# Patient Record
Sex: Female | Born: 1955
Health system: Southern US, Community
[De-identification: ages and names within clinical notes are randomized; demographics above are authoritative.]

## PROBLEM LIST (undated history)

## (undated) DIAGNOSIS — M549 Dorsalgia, unspecified: Secondary | ICD-10-CM

## (undated) DIAGNOSIS — D649 Anemia, unspecified: Secondary | ICD-10-CM

## (undated) DIAGNOSIS — K219 Gastro-esophageal reflux disease without esophagitis: Secondary | ICD-10-CM

## (undated) DIAGNOSIS — M546 Pain in thoracic spine: Secondary | ICD-10-CM

## (undated) DIAGNOSIS — I7300 Raynaud's syndrome without gangrene: Secondary | ICD-10-CM

## (undated) DIAGNOSIS — Z720 Tobacco use: Secondary | ICD-10-CM

## (undated) DIAGNOSIS — G8929 Other chronic pain: Secondary | ICD-10-CM

## (undated) DIAGNOSIS — I2510 Atherosclerotic heart disease of native coronary artery without angina pectoris: Secondary | ICD-10-CM

## (undated) DIAGNOSIS — G35 Multiple sclerosis: Secondary | ICD-10-CM

## (undated) DIAGNOSIS — E785 Hyperlipidemia, unspecified: Secondary | ICD-10-CM

## (undated) DIAGNOSIS — Z9289 Personal history of other medical treatment: Secondary | ICD-10-CM

## (undated) HISTORY — PX: KNEE ARTHROSCOPY: SHX127

---
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
## 1998-06-27 ENCOUNTER — Ambulatory Visit (HOSPITAL_COMMUNITY): Admission: RE | Admit: 1998-06-27 | Discharge: 1998-06-27 | Payer: Self-pay | Admitting: Neurology

## 1999-10-18 ENCOUNTER — Emergency Department (HOSPITAL_COMMUNITY): Admission: EM | Admit: 1999-10-18 | Discharge: 1999-10-18 | Payer: Self-pay | Admitting: Emergency Medicine

## 1999-12-09 ENCOUNTER — Encounter: Payer: Self-pay | Admitting: Internal Medicine

## 1999-12-09 ENCOUNTER — Ambulatory Visit (HOSPITAL_COMMUNITY): Admission: RE | Admit: 1999-12-09 | Discharge: 1999-12-09 | Payer: Self-pay | Admitting: Internal Medicine

## 2000-02-29 ENCOUNTER — Ambulatory Visit (HOSPITAL_COMMUNITY): Admission: RE | Admit: 2000-02-29 | Discharge: 2000-02-29 | Payer: Self-pay | Admitting: *Deleted

## 2000-02-29 ENCOUNTER — Encounter: Payer: Self-pay | Admitting: *Deleted

## 2005-03-14 HISTORY — PX: CORONARY ANGIOPLASTY WITH STENT PLACEMENT: SHX49

## 2008-02-28 ENCOUNTER — Ambulatory Visit: Payer: Self-pay | Admitting: Gastroenterology

## 2008-03-06 ENCOUNTER — Encounter: Payer: Self-pay | Admitting: Gastroenterology

## 2008-03-06 ENCOUNTER — Ambulatory Visit: Payer: Self-pay | Admitting: Gastroenterology

## 2008-10-03 ENCOUNTER — Encounter: Admission: RE | Admit: 2008-10-03 | Discharge: 2008-10-03 | Payer: Self-pay | Admitting: Neurosurgery

## 2008-10-08 ENCOUNTER — Ambulatory Visit: Payer: Self-pay | Admitting: Internal Medicine

## 2009-09-12 ENCOUNTER — Telehealth: Payer: Self-pay | Admitting: Internal Medicine

## 2009-09-13 ENCOUNTER — Ambulatory Visit: Payer: Self-pay | Admitting: Internal Medicine

## 2010-05-25 ENCOUNTER — Encounter: Admission: RE | Admit: 2010-05-25 | Discharge: 2010-05-25 | Payer: Self-pay | Admitting: Psychiatry

## 2011-01-15 NOTE — Assessment & Plan Note (Signed)
Summary: flu shot/cb   Flu Vaccine Consent Questions     Do you have a history of severe allergic reactions to this vaccine? no    Any prior history of allergic reactions to egg and/or gelatin? no    Do you have a sensitivity to the preservative Thimersol? no    Do you have a past history of Guillan-Barre Syndrome? no    Do you currently have an acute febrile illness? no    Have you ever had a severe reaction to latex? no    Vaccine information given and explained to patient? yes    Are you currently pregnant? no    Lot Number:[id number]   Exp Date:06/12/2009   Site Given  Right Deltoid IM   Given by Dimas Millin in allergy lab. Reynaldo Minium CMA  October 29, 2008 1:38 PM                                     ]

## 2011-01-15 NOTE — Progress Notes (Signed)
Summary: For flu vaccine  Phone Note Other Incoming   Summary of Call: I have asked her and her daughter to come by allergy lab for flu vaccine Initial call taken by: Waymon Budge MD,  September 12, 2009 7:39 PM  Follow-up for Phone Call        They both got their flu shot 09-13-09.  Drucie Opitz CMA

## 2011-01-15 NOTE — Assessment & Plan Note (Signed)
Summary: FLU SHOT/[facility]    Other Orders: Future Orders: Admin 1st Vaccine (16109) ... 09/16/2009 Flu Vaccine 36yrs + (60454) ... 09/16/2009    Flu Vaccine Consent Questions     Do you have a history of severe allergic reactions to this vaccine? no    Any prior history of allergic reactions to egg and/or gelatin? no    Do you have a sensitivity to the preservative Thimersol? no    Do you have a past history of Guillan-Barre Syndrome? no    Do you currently have an acute febrile illness? no    Have you ever had a severe reaction to latex? no    Vaccine information given and explained to patient? yes    Are you currently pregnant? no    Lot Number:[id number]   Exp Date:06/12/2010   Site Given Right Deltoid IMu Clarise Cruz (AAMA)  September 16, 2009 10:32 AM

## 2011-04-28 NOTE — Assessment & Plan Note (Signed)
[facility]                         GASTROENTEROLOGY OFFICE NOTE   NAME:Crystal Clark, Crystal Clark                     MRN:          [id number]  DATE:02/28/2008                            DOB:          December 17, 1955    REFERRING PHYSICIAN:  Coletta Clark, M.D.   MULTIPLE SCLEROSIS PHYSICIAN:  Crystal Clark, M.D. at [facility]  [facility].   CHIEF COMPLAINT:  Fecal incontinence.   HISTORY OF PRESENT ILLNESS:  Ms. Crystal Clark is a very pleasant 55 year old  woman who has had at least 2 years of intermittent fecal incontinence.  This has been definitely getting worse over the past 9 months to the  point where she will have daily staining of her underpants. She usually  moves her bowels twice a day, it is soft but generally formed.  She does  not have to strain to move her bowels, she has never seen blood in her  stool.  She is a long time MS patient and is on a variety of neurologic  medicines including Aricept, Oxybutynin, and Betaseron.  She was  recently seen by a spinal surgeon due to a severe T7-T8 herniated disk  that is causing her pretty severe pain.  Surgery for this would be quite  a major operation due to its location and she has elected against that  and instead is managing her pain with Tylenol, Tramadol, Lidoderm patch,  and a TENS unit intermittently.   REVIEW OF SYSTEMS:  Noted for a probable 10 pound weight loss in the  past 6 to 8 months, otherwise essentially normal and it is available on  the nursing intake sheet.   PAST MEDICAL HISTORY:  1. Coronary artery disease with stent in her LAD placed in 2006.  She      has been on Plavix since then but has had held her Plavix for      dental procedures without troubles.  2. Multiple sclerosis since 1997.  3. T7-T8 herniated disk.  4. Angina 2006.  5. Hypertension.  6. Elevated cholesterol.   CURRENT MEDICINES:  Lipitor, Zetia, Norvasc, Plavix, Betaseron, Ambien,  Aricept nightly, aspirin.   Oxybutynin on an as needed basis.  Tramadol,  Tylenol several a day.   ALLERGIES:  MYCINS cause a GI upset.   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  Nonsmoker, nondrinker.   FAMILY HISTORY:  Mother with colon polyps.   PHYSICAL EXAM:  5'2, 108 pounds.  Blood pressure 102/60, pulse 80.  CONSTITUTIONAL:  General well appearing.  NEUROLOGIC:  Alert and oriented x3.  EYES:  Extraocular movements intact.  MOUTH:  Oropharynx moist, no lesions.  NECK:  Supple, no lymphadenopathy.  CARDIOVASCULAR:  Heart regular rate and rhythm.  LUNGS:  Clear to auscultation bilaterally.  ABDOMEN:  Soft, nontender, nondistended.  Normal bowel sounds.  EXTREMITIES:  No lower extremity edema.  SKIN:  No rashes or lesions on visible extremities.  ANORECTAL EXAMINATION:  Deferred for upcoming colonoscopy.   ASSESSMENT/PLAN:  A 55 year old woman with family history of colon  polyps, long term history of  multiple sclerosis on several neurologic  agents with nearly daily fecal incontinence.   Neurologic agents are well known to affect bowel function and are  probably at least contributing to her gastrointestinal issues; that  being said, she needs them for her underlying disease and I hope to be  able to work around these.  I will arrange for her to have a full  colonoscopy both for family history of colon polyps and for her current  fecal incontinence.  Anorectal mechanical issues such as polyps and  tumors can cause incontinence although I doubt that to be the case with  her.  We will arrange for her to have a full colonoscopy at her soonest  convenience.  Following that, I would like her to try a solid 1 month of  fiber supplements to try to bulk her stools to see if we can prevent  some of her incontinence just with a bulking agent. If that does not  work, I may try very low dose Imodium.     Crystal Fee, MD  Electronically  Signed    DPJ/MedQ  DD: 02/28/2008  DT: 02/28/2008  Job #: [id number]   cc:   Crystal Clark, M.D.  Crystal Clark

## 2011-10-14 ENCOUNTER — Other Ambulatory Visit: Payer: Self-pay | Admitting: Psychiatry

## 2011-10-14 DIAGNOSIS — G35 Multiple sclerosis: Secondary | ICD-10-CM

## 2011-10-22 ENCOUNTER — Telehealth: Payer: Self-pay | Admitting: *Deleted

## 2011-10-22 MED ORDER — AZITHROMYCIN 250 MG PO TABS: ORAL_TABLET | ORAL | Status: AC

## 2011-10-22 NOTE — Telephone Encounter (Signed)
Per CY-okay to give her Zpak #1 take as directed no refills.

## 2011-11-07 ENCOUNTER — Ambulatory Visit
Admission: RE | Admit: 2011-11-07 | Discharge: 2011-11-07 | Disposition: A | Discharge status: Home / Self Care | Payer: Medicare Other | Source: Ambulatory Visit | Attending: Psychiatry | Admitting: Psychiatry

## 2011-11-07 DIAGNOSIS — G35 Multiple sclerosis: Secondary | ICD-10-CM

## 2011-11-07 MED ORDER — GADOBENATE DIMEGLUMINE 529 MG/ML IV SOLN
10.0000 mL | Freq: Once | INTRAVENOUS | Status: AC | PRN
  Administered 2011-11-07: 10 mL via INTRAVENOUS

## 2011-12-15 HISTORY — PX: CARDIAC CATHETERIZATION: SHX172

## 2011-12-22 ENCOUNTER — Encounter: Payer: Self-pay | Admitting: Emergency Medicine

## 2011-12-22 ENCOUNTER — Inpatient Hospital Stay (HOSPITAL_COMMUNITY)
Admission: EM | Admit: 2011-12-22 | Discharge: 2011-12-24 | DRG: 287 | Disposition: A | Discharge status: Home / Self Care | Payer: Managed Care, Other (non HMO) | Attending: Cardiology | Admitting: Cardiology

## 2011-12-22 ENCOUNTER — Emergency Department (HOSPITAL_COMMUNITY): Payer: Managed Care, Other (non HMO)

## 2011-12-22 DIAGNOSIS — Z9861 Coronary angioplasty status: Secondary | ICD-10-CM

## 2011-12-22 DIAGNOSIS — Z888 Allergy status to other drugs, medicaments and biological substances status: Secondary | ICD-10-CM

## 2011-12-22 DIAGNOSIS — I200 Unstable angina: Secondary | ICD-10-CM

## 2011-12-22 DIAGNOSIS — G43909 Migraine, unspecified, not intractable, without status migrainosus: Secondary | ICD-10-CM | POA: Diagnosis present

## 2011-12-22 DIAGNOSIS — I7300 Raynaud's syndrome without gangrene: Secondary | ICD-10-CM | POA: Diagnosis present

## 2011-12-22 DIAGNOSIS — I2510 Atherosclerotic heart disease of native coronary artery without angina pectoris: Principal | ICD-10-CM | POA: Diagnosis present

## 2011-12-22 DIAGNOSIS — G35 Multiple sclerosis: Secondary | ICD-10-CM | POA: Diagnosis present

## 2011-12-22 DIAGNOSIS — F17200 Nicotine dependence, unspecified, uncomplicated: Secondary | ICD-10-CM | POA: Diagnosis present

## 2011-12-22 DIAGNOSIS — R079 Chest pain, unspecified: Secondary | ICD-10-CM | POA: Diagnosis not present

## 2011-12-22 DIAGNOSIS — E785 Hyperlipidemia, unspecified: Secondary | ICD-10-CM | POA: Diagnosis present

## 2011-12-22 DIAGNOSIS — I10 Essential (primary) hypertension: Secondary | ICD-10-CM | POA: Diagnosis present

## 2011-12-22 DIAGNOSIS — I208 Other forms of angina pectoris: Secondary | ICD-10-CM | POA: Insufficient documentation

## 2011-12-22 DIAGNOSIS — I2089 Other forms of angina pectoris: Secondary | ICD-10-CM | POA: Insufficient documentation

## 2011-12-22 HISTORY — DX: Multiple sclerosis: G35

## 2011-12-22 HISTORY — DX: Atherosclerotic heart disease of native coronary artery without angina pectoris: I25.10

## 2011-12-22 HISTORY — DX: Hyperlipidemia, unspecified: E78.5

## 2011-12-22 LAB — BASIC METABOLIC PANEL
BUN: 10 mg/dL (ref 6–23)
CO2: 24 mEq/L (ref 19–32)
Calcium: 9.8 mg/dL (ref 8.4–10.5)
Chloride: 97 mEq/L (ref 96–112)
Creatinine, Ser: 0.65 mg/dL (ref 0.50–1.10)
GFR calc Af Amer: >90 mL/min (ref >90)
GFR calc non Af Amer: >90 mL/min (ref >90)
Glucose, Bld: 104 mg/dL — ABNORMAL HIGH (ref 70–99)
Potassium: 4.1 mEq/L (ref 3.5–5.1)
Sodium: 135 mEq/L (ref 135–145)

## 2011-12-22 LAB — CBC
HCT: 42.4 % (ref 36.0–46.0)
Hemoglobin: 14.3 g/dL (ref 12.0–15.0)
MCH: 31.6 pg (ref 26.0–34.0)
MCHC: 33.7 g/dL (ref 30.0–36.0)
MCV: 93.8 fL (ref 78.0–100.0)
Platelets: 328 10*3/uL (ref 150–400)
RBC: 4.52 MIL/uL (ref 3.87–5.11)
RDW: 13.2 % (ref 11.5–15.5)
WBC: 6 10*3/uL (ref 4.0–10.5)

## 2011-12-22 LAB — CARDIAC PANEL(CRET KIN+CKTOT+MB+TROPI)
CK, MB: 1.8 ng/mL (ref 0.3–4.0)
Relative Index: INVALID (ref 0.0–2.5)
Total CK: 62 U/L (ref 7–177)
Troponin I: <0.3 ng/mL (ref <0.30)

## 2011-12-22 LAB — PROTIME-INR
INR: 1.11 (ref 0.00–1.49)
Prothrombin Time: 14.5 seconds (ref 11.6–15.2)

## 2011-12-22 LAB — POCT I-STAT TROPONIN I: Troponin i, poc: 0 ng/mL (ref 0.00–0.08)

## 2011-12-22 LAB — APTT: aPTT: 183 seconds — ABNORMAL HIGH (ref 24–37)

## 2011-12-22 MED ORDER — BACLOFEN 10 MG PO TABS
10.0000 mg | ORAL_TABLET | Freq: Three times a day (TID) | ORAL | Status: DC
  Filled 2011-12-22 (×4): qty 1

## 2011-12-22 MED ORDER — DIAZEPAM 5 MG PO TABS
5.0000 mg | ORAL_TABLET | ORAL | Status: AC
  Administered 2011-12-23: 5 mg via ORAL
  Filled 2011-12-22: qty 1

## 2011-12-22 MED ORDER — AMLODIPINE BESYLATE 5 MG PO TABS
5.0000 mg | ORAL_TABLET | Freq: Every day | ORAL | Status: DC
  Administered 2011-12-23 – 2011-12-24 (×2): 5 mg via ORAL
  Filled 2011-12-22 (×3): qty 1

## 2011-12-22 MED ORDER — PANTOPRAZOLE SODIUM 40 MG PO TBEC
40.0000 mg | DELAYED_RELEASE_TABLET | Freq: Every day | ORAL | Status: DC
  Administered 2011-12-24: 40 mg via ORAL
  Filled 2011-12-22: qty 1

## 2011-12-22 MED ORDER — HEPARIN SOD (PORCINE) IN D5W 100 UNIT/ML IV SOLN
600.0000 [IU]/h | INTRAVENOUS | Status: AC
  Administered 2011-12-22: 600 [IU]/h via INTRAVENOUS
  Filled 2011-12-22: qty 250

## 2011-12-22 MED ORDER — RANOLAZINE ER 500 MG PO TB12
500.0000 mg | ORAL_TABLET | Freq: Two times a day (BID) | ORAL | Status: DC
  Administered 2011-12-22 – 2011-12-23 (×2): 500 mg via ORAL
  Filled 2011-12-22 (×3): qty 1

## 2011-12-22 MED ORDER — ISOSORBIDE MONONITRATE 15 MG HALF TABLET
15.0000 mg | ORAL_TABLET | Freq: Two times a day (BID) | ORAL | Status: DC
  Administered 2011-12-22 – 2011-12-24 (×4): 15 mg via ORAL
  Filled 2011-12-22 (×6): qty 1

## 2011-12-22 MED ORDER — CLOPIDOGREL BISULFATE 75 MG PO TABS
75.0000 mg | ORAL_TABLET | Freq: Every day | ORAL | Status: DC
  Administered 2011-12-23 – 2011-12-24 (×2): 75 mg via ORAL
  Filled 2011-12-22 (×2): qty 1

## 2011-12-22 MED ORDER — ASPIRIN 81 MG PO CHEW
324.0000 mg | CHEWABLE_TABLET | Freq: Once | ORAL | Status: AC
Start: 2011-12-22 — End: 2011-12-22
  Administered 2011-12-22: 324 mg via ORAL
  Filled 2011-12-22: qty 4

## 2011-12-22 MED ORDER — HEPARIN SOD (PORCINE) IN D5W 100 UNIT/ML IV SOLN
600.0000 [IU]/h | INTRAVENOUS | Status: DC
  Administered 2011-12-22: 600 [IU]/h via INTRAVENOUS
  Filled 2011-12-22: qty 250

## 2011-12-22 MED ORDER — SODIUM CHLORIDE 0.9 % IJ SOLN: 3.0000 mL | INTRAMUSCULAR | Status: DC | PRN

## 2011-12-22 MED ORDER — FINGOLIMOD HCL 0.5 MG PO CAPS
1.0000 | ORAL_CAPSULE | Freq: Every day | ORAL | Status: DC
  Administered 2011-12-24: 0.5 mg via ORAL
  Filled 2011-12-22 (×3): qty 1

## 2011-12-22 MED ORDER — ASPIRIN 81 MG PO CHEW
324.0000 mg | CHEWABLE_TABLET | ORAL | Status: AC
  Administered 2011-12-23: 324 mg via ORAL
  Filled 2011-12-22: qty 4

## 2011-12-22 MED ORDER — HEPARIN BOLUS VIA INFUSION
2900.0000 [IU] | Freq: Once | INTRAVENOUS | Status: AC
  Administered 2011-12-22: 2900 [IU] via INTRAVENOUS
  Filled 2011-12-22: qty 2900

## 2011-12-22 MED ORDER — SODIUM CHLORIDE 0.9 % IJ SOLN: 3.0000 mL | Freq: Two times a day (BID) | INTRAMUSCULAR | Status: DC

## 2011-12-22 MED ORDER — ZOLPIDEM TARTRATE 5 MG PO TABS
5.0000 mg | ORAL_TABLET | Freq: Every evening | ORAL | Status: DC | PRN
  Filled 2011-12-22: qty 1

## 2011-12-22 MED ORDER — ASPIRIN EC 81 MG PO TBEC
81.0000 mg | DELAYED_RELEASE_TABLET | Freq: Every day | ORAL | Status: DC
  Filled 2011-12-22 (×2): qty 1

## 2011-12-22 MED ORDER — NITROGLYCERIN IN D5W 200-5 MCG/ML-% IV SOLN
2.0000 ug/min | INTRAVENOUS | Status: DC
  Administered 2011-12-22: 5 ug/min via INTRAVENOUS
  Filled 2011-12-22: qty 250

## 2011-12-22 MED ORDER — SIMVASTATIN 20 MG PO TABS
20.0000 mg | ORAL_TABLET | Freq: Every day | ORAL | Status: DC
  Filled 2011-12-22 (×2): qty 1

## 2011-12-22 MED ORDER — TRAMADOL HCL 50 MG PO TABS
50.0000 mg | ORAL_TABLET | Freq: Four times a day (QID) | ORAL | Status: DC | PRN
  Filled 2011-12-22: qty 2

## 2011-12-22 MED ORDER — NITROGLYCERIN 0.4 MG SL SUBL: 0.4000 mg | SUBLINGUAL_TABLET | SUBLINGUAL | Status: DC | PRN

## 2011-12-22 MED ORDER — ALPRAZOLAM 0.25 MG PO TABS: 0.0625 mg | ORAL_TABLET | Freq: Every evening | ORAL | Status: DC | PRN

## 2011-12-22 MED ORDER — HEPARIN BOLUS VIA INFUSION: 5000.0000 [IU] | Freq: Once | INTRAVENOUS | Status: DC

## 2011-12-22 MED ORDER — SODIUM CHLORIDE 0.9 % IV SOLN
1.0000 mL/kg/h | INTRAVENOUS | Status: DC
  Administered 2011-12-23: 1 mL/kg/h via INTRAVENOUS

## 2011-12-22 MED ORDER — BUPROPION HCL ER (SR) 150 MG PO TB12
150.0000 mg | ORAL_TABLET | Freq: Every day | ORAL | Status: DC
  Administered 2011-12-23 – 2011-12-24 (×2): 150 mg via ORAL
  Filled 2011-12-22 (×3): qty 1

## 2011-12-22 MED ORDER — ASPIRIN 81 MG PO CHEW: 324.0000 mg | CHEWABLE_TABLET | ORAL | Status: DC

## 2011-12-22 MED ORDER — SODIUM CHLORIDE 0.9 % IV SOLN: 250.0000 mL | INTRAVENOUS | Status: DC | PRN

## 2011-12-22 MED ORDER — NITROGLYCERIN IN D5W 200-5 MCG/ML-% IV SOLN
3.0000 ug/min | INTRAVENOUS | Status: DC
  Administered 2011-12-22: 15 ug/min via INTRAVENOUS

## 2011-12-22 MED ORDER — ONDANSETRON HCL 4 MG/2ML IJ SOLN
4.0000 mg | Freq: Four times a day (QID) | INTRAMUSCULAR | Status: DC | PRN
  Administered 2011-12-23: 4 mg via INTRAVENOUS

## 2011-12-22 MED ORDER — LORATADINE 10 MG PO TABS
10.0000 mg | ORAL_TABLET | Freq: Every day | ORAL | Status: DC
  Administered 2011-12-23 – 2011-12-24 (×2): 10 mg via ORAL
  Filled 2011-12-22 (×3): qty 1

## 2011-12-22 MED ORDER — LIDOCAINE 5 % EX PTCH
2.0000 | MEDICATED_PATCH | Freq: Every day | CUTANEOUS | Status: DC | PRN
  Filled 2011-12-22: qty 2

## 2011-12-22 MED ORDER — ASPIRIN EC 81 MG PO TBEC
81.0000 mg | DELAYED_RELEASE_TABLET | Freq: Every day | ORAL | Status: DC
  Administered 2011-12-24: 81 mg via ORAL
  Filled 2011-12-22 (×2): qty 1

## 2011-12-22 MED ORDER — SODIUM CHLORIDE 0.9 % IJ SOLN
3.0000 mL | Freq: Two times a day (BID) | INTRAMUSCULAR | Status: DC
  Administered 2011-12-23: 10:00:00 via INTRAVENOUS

## 2011-12-22 MED ORDER — ASPIRIN 300 MG RE SUPP: 300.0000 mg | RECTAL | Status: DC

## 2011-12-22 MED ORDER — EZETIMIBE 10 MG PO TABS
10.0000 mg | ORAL_TABLET | Freq: Every day | ORAL | Status: DC
  Administered 2011-12-23 – 2011-12-24 (×2): 10 mg via ORAL
  Filled 2011-12-22 (×3): qty 1

## 2011-12-22 MED ORDER — ALBUTEROL SULFATE HFA 108 (90 BASE) MCG/ACT IN AERS: 2.0000 | INHALATION_SPRAY | RESPIRATORY_TRACT | Status: DC | PRN

## 2011-12-22 MED ORDER — ACETAMINOPHEN 325 MG PO TABS: 650.0000 mg | ORAL_TABLET | ORAL | Status: DC | PRN

## 2011-12-22 NOTE — ED Provider Notes (Signed)
History     CSN: [id number]  Arrival date & time 12/22/11  1607   First MD Initiated Contact with Patient 12/22/11 1704      Chief Complaint  Patient presents with  . Chest Pain    (Consider location/radiation/quality/duration/timing/severity/associated sxs/prior treatment) HPI History provided by pt.   Pt presents w/ what she believes to be unstable angina.   Has had intermittent, exertional, self-limited, squeezing sensation in center of chest w/ radiation to back and associated SOB, nausea and LUE paresthesias x 2-3 weeks.  2 nights ago, she woke in the middle of the night with the same pain.  Pain started again this am and has been constant and refractory to imdur.  Pt had similar pain but less severe back in 2006, just prior to stenting of 80% occluded proximal LAD.  Did not have EKG changes at that time.  Most recent cath in 2010 which showed a 40% occluded stent.  Pain non-pleuritic.  No recent cough, fever, abd pain, heavy lifting or trauma.  Smokes cigarettes occasionally and strong FH CAD.  Cardiologist located at [facility].    Past Medical History  Diagnosis Date  . Coronary artery disease   . Angina at rest     History reviewed. No pertinent past surgical history.  History reviewed. No pertinent family history.  History  Substance Use Topics  . Smoking status: Current Everyday Smoker  . Smokeless tobacco: Not on file  . Alcohol Use: No    OB History    Grav Para Term Preterm Abortions TAB SAB Ect Mult Living                  Review of Systems  All other systems reviewed and are negative.    Allergies  Review of patient's allergies indicates no known allergies.  Home Medications   Current Outpatient Rx  Name Route Sig Dispense Refill  . ALPRAZOLAM 0.25 MG PO TABS Oral Take 0.0625 mg by mouth at bedtime as needed. For sleep     . AMLODIPINE BESYLATE 5 MG PO TABS Oral Take 5 mg by mouth daily.      . ASPIRIN EC 81 MG PO TBEC Oral Take 81 mg by  mouth daily.      . ATORVASTATIN CALCIUM 20 MG PO TABS Oral Take 20 mg by mouth daily.      Marland Kitchen BACLOFEN 10 MG PO TABS Oral Take 10 mg by mouth 3 (three) times daily.      . BUPROPION HCL ER (SR) 150 MG PO TB12 Oral Take 150 mg by mouth daily.      Marland Kitchen CLOPIDOGREL BISULFATE 75 MG PO TABS Oral Take 75 mg by mouth daily.      Marland Kitchen ESZOPICLONE 2 MG PO TABS Oral Take 2 mg by mouth at bedtime. Take immediately before bedtime     . EZETIMIBE 10 MG PO TABS Oral Take 10 mg by mouth daily.      Marland Kitchen FINGOLIMOD HCL 0.5 MG PO CAPS Oral Take 1 capsule by mouth daily.      . ISOSORBIDE MONONITRATE ER 30 MG PO TB24 Oral Take 15 mg by mouth 2 (two) times daily.      Marland Kitchen LORATADINE 10 MG PO TABS Oral Take 10 mg by mouth daily.      Marland Kitchen OMEPRAZOLE 20 MG PO CPDR Oral Take 20 mg by mouth daily.      Marland Kitchen RANOLAZINE ER 500 MG PO TB12 Oral Take 500 mg by mouth 2 (  two) times daily.      . TRAMADOL HCL 50 MG PO TABS Oral Take 50-100 mg by mouth every 6 (six) hours as needed. For pain     . ALBUTEROL SULFATE HFA 108 (90 BASE) MCG/ACT IN AERS Inhalation Inhale 2 puffs into the lungs every 4 (four) hours as needed. For shortness of breath     . LIDOCAINE 5 % EX PTCH Transdermal Place 2 patches onto the skin daily as needed. For pain Remove & Discard patch within 12 hours or as directed by MD       BP 137/81  Pulse 82  Temp(Src) 97.4 F (36.3 C) (Oral)  Resp 18  SpO2 100%  Physical Exam  Nursing note and vitals reviewed. Constitutional: She is oriented to person, place, and time. She appears well-developed and well-nourished. No distress.  HENT:  Head: Normocephalic and atraumatic.  Eyes:       Normal appearance  Neck: Normal range of motion.  Cardiovascular: Normal rate and regular rhythm.   Pulmonary/Chest: Effort normal and breath sounds normal. No respiratory distress. She exhibits no tenderness.  Abdominal: Soft. Bowel sounds are normal. She exhibits no distension. There is no tenderness. There is no guarding.    Musculoskeletal: She exhibits no edema and no tenderness.       No calf ttp or peripheral edema  Neurological: She is alert and oriented to person, place, and time.  Skin: Skin is warm and dry. No rash noted. She is not diaphoretic.  Psychiatric: She has a normal mood and affect. Her behavior is normal.    ED Course  Procedures (including critical care time)  Labs Reviewed  BASIC METABOLIC PANEL - Abnormal; Notable for the following:    Glucose, Bld 104 (*)    All other components within normal limits  APTT - Abnormal; Notable for the following:    aPTT 183 (*)    All other components within normal limits  LIPID PANEL - Abnormal; Notable for the following:    Cholesterol 250 (*)    LDL Cholesterol 152 (*)    All other components within normal limits  CBC - Abnormal; Notable for the following:    RBC 3.80 (*)    Hemoglobin 11.8 (*) DELTA CHECK NOTED   HCT 35.5 (*)    All other components within normal limits  CBC  POCT I-STAT TROPONIN I  CARDIAC PANEL(CRET KIN+CKTOT+MB+TROPI)  CARDIAC PANEL(CRET KIN+CKTOT+MB+TROPI)  CARDIAC PANEL(CRET KIN+CKTOT+MB+TROPI)  PROTIME-INR  BASIC METABOLIC PANEL  MRSA PCR SCREENING  HEPARIN LEVEL (UNFRACTIONATED)  I-STAT TROPONIN I  HEMOGLOBIN A1C   Dg Chest 2 View  12/22/2011  *RADIOLOGY REPORT*  Clinical Data: Left chest pain history of coronary disease  CHEST - 2 VIEW  Comparison: None.  Findings: Normal heart size and vascularity.  Negative for CHF, pneumonia, collapse, consolidation, effusion or pneumothorax. Trachea midline.  Symmetric lower chest nodular densities compatible with nipple shadows, this could be confirmed with repeat exam including markers.  Trachea is midline.  Mild thoracic degenerative changes.  Coronary stents noted.  IMPRESSION: No acute chest finding.  Original Report Authenticated By: Judie Petit. Ruel Favors, M.D.     1. Unstable angina       MDM  Pt w/ h/o CAD, most recent cath in 2010 which showed 40% occluded proximal  LAD stent, presents w/ intermittent CP x 2+ wks.  Pain has progressed in severity and is now occuring at rest and refractory to imdur.  Pt w/ active CP in ED.  Aspirin  and nitro drip ordered.  No acute findings on exam.  EKG non-ischemic (no prior; cardiologist at [facility]).  Labs pending.  5:49 PM   Pt reports that pain has improved from 8 to 5/10 but she is also experiencing a headache from the nitroglycerin.  She is able to tolerate it for now and declines tylenol.  Troponin neg and labs otherwise unremarkable.    Pain has resolved at 36mcg/min nitro drip.  Consulted Dr. Myrtis Ser w/ [facility].  They request a repeat EKG and 5000 units heparin bolus followed by drip.            Otilio Miu, [place] 12/23/11 1443

## 2011-12-22 NOTE — H&P (Signed)
History and Physical  Patient ID: Crystal Clark MRN: [id number], SOB: [birth year] 56 y.o. Date of Encounter: 12/22/2011, 8:19 PM  Primary Physician: No primary provider on file. Primary Cardiologist: Dr. Claris Gower in [facility], transferring care to Dr. Antoine Poche  Chief Complaint: Chest pain  Reason for Admission: UA  HPI:   Ms. Crystal Clark is a 56 y.o. female with history of CAD s/p stent LAD in 2006 followed by cardiology at [facility].  She is currently trying to transfer care to Dr. Antoine Poche to have a cardiologist that is closer to her home.  She has had boughts of again since stent with last cath being in 2010, per patient her stent had 40% stenosis at that time.  She has been on medical therapy.  She also has history of HTN, HL and MS.    Her angina has progressively gotten worse over the last month with the last 3 days being the worse.  Her angina is a feeling of someone squeezing her heart.  The last 3 days she has had associated left arm numbness, N, and diaphoresis.  1 episode awoke her from her sleep approximately 3 days ago.  The pain has been relieved by nitro until today.  Yesterday she felt increased fatigue, worse than her typical MS fatigue.  Today she awoke and had the squeezing sensation all day.  She has not had relief with nitro.  When the pain did not resolve she came to the ER for further evaluation.    She has been placed on nitroglycerin drip.  This has helped ease the squeezing sensation but not there is still some pressure across the front of her chest.  Troponin is negative x1.  EKG SR without acute changes.  Vitals are stable.  Cardiology was asked to admit the patient for further evaluation.      Past Medical History  Diagnosis Date  . Coronary artery disease     s/p stent to LAD in 2006  . Angina at rest   . MS (multiple sclerosis)     since 1997  . Hypertension   . Dyslipidemia      Surgical History: History reviewed. No pertinent past surgical history.    Home Meds: Prior to Admission medications   Medication Sig Start Date End Date Taking? Authorizing Provider  ALPRAZolam (XANAX) 0.25 MG tablet Take 0.0625 mg by mouth at bedtime as needed. For sleep    Yes Historical Provider, MD  amLODipine (NORVASC) 5 MG tablet Take 5 mg by mouth daily.     Yes Historical Provider, MD  aspirin EC 81 MG tablet Take 81 mg by mouth daily.     Yes Historical Provider, MD  atorvastatin (LIPITOR) 20 MG tablet Take 20 mg by mouth daily.     Yes Historical Provider, MD  baclofen (LIORESAL) 10 MG tablet Take 10 mg by mouth 3 (three) times daily.     Yes Historical Provider, MD  buPROPion (WELLBUTRIN SR) 150 MG 12 hr tablet Take 150 mg by mouth daily.     Yes Historical Provider, MD  clopidogrel (PLAVIX) 75 MG tablet Take 75 mg by mouth daily.     Yes Historical Provider, MD  eszopiclone (LUNESTA) 2 MG TABS Take 2 mg by mouth at bedtime. Take immediately before bedtime    Yes Historical Provider, MD  ezetimibe (ZETIA) 10 MG tablet Take 10 mg by mouth daily.     Yes Historical Provider, MD  Fingolimod HCl (GILENYA) 0.5 MG CAPS Take 1  capsule by mouth daily.     Yes Historical Provider, MD  isosorbide mononitrate (IMDUR) 30 MG 24 hr tablet Take 15 mg by mouth 2 (two) times daily.     Yes Historical Provider, MD  loratadine (CLARITIN) 10 MG tablet Take 10 mg by mouth daily.     Yes Historical Provider, MD  omeprazole (PRILOSEC) 20 MG capsule Take 20 mg by mouth daily.     Yes Historical Provider, MD  ranolazine (RANEXA) 500 MG 12 hr tablet Take 500 mg by mouth 2 (two) times daily.     Yes Historical Provider, MD  traMADol (ULTRAM) 50 MG tablet Take 50-100 mg by mouth every 6 (six) hours as needed. For pain    Yes Historical Provider, MD  albuterol (PROVENTIL HFA;VENTOLIN HFA) 108 (90 BASE) MCG/ACT inhaler Inhale 2 puffs into the lungs every 4 (four) hours as needed. For shortness of breath     Historical Provider, MD  lidocaine (LIDODERM) 5 % Place 2 patches onto the  skin daily as needed. For pain Remove & Discard patch within 12 hours or as directed by MD     Historical Provider, MD    Allergies: No Known Allergies  History   Social History  . Marital Status: Married    Spouse Name: N/A    Number of Children: N/A  . Years of Education: N/A   Occupational History  . 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 Smoking status: Never Smoker   . Smokeless tobacco: Not on file  . Alcohol Use: No  . Drug Use: No  . Sexually Active: Not on file   Other Topics Concern  . Not on file   Social History Narrative  . No narrative on file     Family History  Problem Relation Age of Onset  . Colon polyps Mother   . Alzheimer's disease Mother   . Hypertension Mother   . Coronary artery disease Father     s/p CABG at 66    Review of Systems: General: negative for chills, fever, night sweats or weight changes, +fatigue  Cardiovascular: + chest pain, negative for edema, orthopnea, palpitations, paroxysmal nocturnal dyspnea, shortness of breath or dyspnea on exertion Dermatological: negative for rash Respiratory: negative for cough or wheezing Urologic: negative for hematuria Abdominal: negative for nausea, vomiting, diarrhea, bright red blood per rectum, melena, or hematemesis Neurologic: negative for visual changes, syncope, or dizziness All other systems reviewed and are otherwise negative except as noted above.  Labs:   Lab Results  Component Value Date   WBC 6.0 12/22/2011   HGB 14.3 12/22/2011   HCT 42.4 12/22/2011   MCV 93.8 12/22/2011   PLT 328 12/22/2011     Lab 12/22/11 1659  NA 135  K 4.1  CL 97  CO2 24  BUN 10  CREATININE 0.65  CALCIUM 9.8  PROT --  BILITOT --  ALKPHOS --  ALT --  AST --  GLUCOSE 104*   No results found for this basename: CKTOTAL:4,CKMB:4,TROPONINI:4 in the last 72 hours No results found for this basename: CHOL,  HDL,  LDLCALC,  TRIG   No results found for this basename:  DDIMER    Radiology/Studies:  Dg Chest 2 View  12/22/2011  *RADIOLOGY REPORT*  Clinical Data: Left chest pain history of coronary disease  CHEST - 2 VIEW  Comparison: None.  Findings: Normal heart size and vascularity.  Negative for CHF, pneumonia, collapse, consolidation,  effusion or pneumothorax. Trachea midline.  Symmetric lower chest nodular densities compatible with nipple shadows, this could be confirmed with repeat exam including markers.  Trachea is midline.  Mild thoracic degenerative changes.  Coronary stents noted.  IMPRESSION: No acute chest finding.  Original Report Authenticated By: Judie Petit. TREVOR Miles Costain, M.D.     EKG: SR 81 bpm, NSST wave changes   Physical Exam: Blood pressure 130/75, pulse 74, temperature 98.7 F (37.1 C), temperature source Oral, resp. rate 22, height 5\' 2"  (1.575 m), weight 106 lb (48.081 kg), SpO2 99.00%. General: Well developed, well nourished, in no acute distress. Head: Normocephalic, atraumatic, sclera non-icteric, no xanthomas, nares are without discharge.  Neck: Negative for carotid bruits. JVD not elevated. Lungs: Clear bilaterally to auscultation without wheezes, rales, or rhonchi. Breathing is unlabored. Heart: RRR with S1 S2. No murmurs, rubs, or gallops appreciated. Abdomen: Soft, non-tender, non-distended with normoactive bowel sounds. No hepatomegaly. No rebound/guarding. No obvious abdominal masses. Msk:  Strength and tone appear normal for age. Extremities: No clubbing or cyanosis. No edema.  Distal pedal pulses are 2+ and equal bilaterally. Neuro: Alert and oriented X 3. Moves all extremities spontaneously. Psych:  Responds to questions appropriately with a normal affect.     ASSESSMENT AND PLAN:  1. CAD s/p stent to the LAD in 2006 now presenting with unstable angina.  - admit to telemetry  - continue nitro gtt  - cycle cardiac markers   - plan cath in the morning  - heparin per pharmacy 2. HTN - stable continue current medications 3.  HL - check lipids 4. MS - pharmacy does not have gilenya in stock so the patient will bring this from home  Signed, Robbi Garter PA-C 12/22/2011, 8:19 PM  Patient seen and examined. I agree with the assessment and plan as detailed above. See also my additional thoughts below.   I saw patient in ER. She is establishing with Dr. Antoine Poche. She needs to be cathed this admission.  Willa Rough, MD, [facility] 12/23/2011 6:50 AM

## 2011-12-22 NOTE — ED Notes (Addendum)
Pt returned from x-ray. States pain decreased to 5/10. Increased nitro gtt to 10 mcg/min.

## 2011-12-22 NOTE — ED Notes (Signed)
Pt c/o intermittant CP since before christmas; pt sts has taken nitro at home with releif at times; pt sts took 5 SL nitro and meds today and did not help today; pt sts midsternal chest pressure with left arm numbness

## 2011-12-22 NOTE — ED Notes (Signed)
Report to [place], [place] on 2900. Informed her Heparin gtt had not arrived from pharmacy.

## 2011-12-22 NOTE — Progress Notes (Signed)
ANTICOAGULATION CONSULT NOTE - Initial Consult  Pharmacy Consult for heparin Indication: chest pain/ACS  No Known Allergies  Patient Measurements: Height: 5\' 2"  (157.5 cm) Weight: 106 lb (48.081 kg) IBW/kg (Calculated) : 50.1  Adjusted Body Weight: 48 kg  Vital Signs: Temp: 98.7 F (37.1 C) (01/08 1821) Temp src: Oral (01/08 1821) BP: 129/75 mmHg (01/08 1925) Pulse Rate: 74  (01/08 1821)  Labs:  Basename 12/22/11 1659  HGB 14.3  HCT 42.4  PLT 328  APTT --  LABPROT --  INR --  HEPARINUNFRC --  CREATININE 0.65  CKTOTAL --  CKMB --  TROPONINI --   Estimated Creatinine Clearance: 61.8 ml/min (by C-G formula based on Cr of 0.65).  Medical History: Past Medical History  Diagnosis Date  . Coronary artery disease   . Angina at rest     Medications:  Scheduled:    . aspirin  324 mg Oral Once  . heparin  5,000 Units Intravenous Once   Infusions:    . nitroGLYCERIN 15 mcg/min (12/22/11 1907)    Assessment: 56 yo female with ACS will be started on heparin.  Plt 328.  On aspirin and plavix at home. Goal of Therapy:  Heparin level 0.3-0.7 units/ml   Plan:  1) Start heparin 2900 units iv bolus x1, then heparin drip at 600 units/hr 2) Check a 6 hours heparin level after drip is started 3) Daily heparin level and CBC  Francy Mcilvaine, Tsz-Yin 12/22/2011,7:49 PM

## 2011-12-22 NOTE — ED Notes (Signed)
Pt denies chest and back pain. States her chest feels heavy. PA at bedside. PA states to continue NTG gtt at 21mcg/min.

## 2011-12-22 NOTE — ED Notes (Signed)
2922-01 Ready 

## 2011-12-22 NOTE — ED Notes (Signed)
Resting quietly in bed. No change from previous assessment. Denies pain. NTG infusing at 55mcg/hr. Awaiting heparin gtt from pharmacy. NAD noted.

## 2011-12-23 ENCOUNTER — Encounter (HOSPITAL_COMMUNITY)
Admission: EM | Disposition: A | Discharge status: Home / Self Care | Payer: Self-pay | Source: Home / Self Care | Attending: Cardiology

## 2011-12-23 DIAGNOSIS — I2510 Atherosclerotic heart disease of native coronary artery without angina pectoris: Secondary | ICD-10-CM

## 2011-12-23 DIAGNOSIS — R079 Chest pain, unspecified: Secondary | ICD-10-CM | POA: Diagnosis not present

## 2011-12-23 DIAGNOSIS — E785 Hyperlipidemia, unspecified: Secondary | ICD-10-CM | POA: Diagnosis present

## 2011-12-23 HISTORY — PX: LEFT HEART CATHETERIZATION WITH CORONARY ANGIOGRAM: SHX5451

## 2011-12-23 LAB — CARDIAC PANEL(CRET KIN+CKTOT+MB+TROPI)
CK, MB: 1.7 ng/mL (ref 0.3–4.0)
CK, MB: 1.8 ng/mL (ref 0.3–4.0)
Relative Index: INVALID (ref 0.0–2.5)
Relative Index: INVALID (ref 0.0–2.5)
Total CK: 53 U/L (ref 7–177)
Total CK: 58 U/L (ref 7–177)
Troponin I: <0.3 ng/mL (ref <0.30)
Troponin I: <0.3 ng/mL (ref <0.30)

## 2011-12-23 LAB — BASIC METABOLIC PANEL
BUN: 10 mg/dL (ref 6–23)
CO2: 26 mEq/L (ref 19–32)
Calcium: 9.3 mg/dL (ref 8.4–10.5)
Chloride: 104 mEq/L (ref 96–112)
Creatinine, Ser: 0.65 mg/dL (ref 0.50–1.10)
GFR calc Af Amer: >90 mL/min (ref >90)
GFR calc non Af Amer: >90 mL/min (ref >90)
Glucose, Bld: 81 mg/dL (ref 70–99)
Potassium: 3.7 mEq/L (ref 3.5–5.1)
Sodium: 140 mEq/L (ref 135–145)

## 2011-12-23 LAB — LIPID PANEL
Cholesterol: 250 mg/dL — ABNORMAL HIGH (ref 0–200)
HDL: 84 mg/dL (ref >39)
LDL Cholesterol: 152 mg/dL — ABNORMAL HIGH (ref 0–99)
Total CHOL/HDL Ratio: 3 RATIO
Triglycerides: 71 mg/dL (ref <150)
VLDL: 14 mg/dL (ref 0–40)

## 2011-12-23 LAB — CBC
HCT: 35.5 % — ABNORMAL LOW (ref 36.0–46.0)
Hemoglobin: 11.8 g/dL — ABNORMAL LOW (ref 12.0–15.0)
MCH: 31.1 pg (ref 26.0–34.0)
MCHC: 33.2 g/dL (ref 30.0–36.0)
MCV: 93.4 fL (ref 78.0–100.0)
Platelets: 292 10*3/uL (ref 150–400)
RBC: 3.8 MIL/uL — ABNORMAL LOW (ref 3.87–5.11)
RDW: 13.6 % (ref 11.5–15.5)
WBC: 4.1 10*3/uL (ref 4.0–10.5)

## 2011-12-23 LAB — HEMOGLOBIN A1C
Hgb A1c MFr Bld: 5.7 % — ABNORMAL HIGH (ref <5.7)
Mean Plasma Glucose: 117 mg/dL — ABNORMAL HIGH (ref <117)

## 2011-12-23 LAB — MRSA PCR SCREENING: MRSA by PCR: NEGATIVE

## 2011-12-23 LAB — HEPARIN LEVEL (UNFRACTIONATED): Heparin Unfractionated: 0.39 IU/mL (ref 0.30–0.70)

## 2011-12-23 LAB — POCT ACTIVATED CLOTTING TIME: Activated Clotting Time: 127 seconds

## 2011-12-23 SURGERY — LEFT HEART CATHETERIZATION WITH CORONARY ANGIOGRAM
Anesthesia: LOCAL

## 2011-12-23 MED ORDER — SODIUM CHLORIDE 0.9 % IV SOLN
INTRAVENOUS | Status: AC
  Administered 2011-12-23: 13:00:00 via INTRAVENOUS

## 2011-12-23 MED ORDER — HEPARIN (PORCINE) IN NACL 2-0.9 UNIT/ML-% IJ SOLN
INTRAMUSCULAR | Status: AC
  Filled 2011-12-23: qty 2000

## 2011-12-23 MED ORDER — NITROGLYCERIN 0.2 MG/ML ON CALL CATH LAB
INTRAVENOUS | Status: AC
  Filled 2011-12-23: qty 1

## 2011-12-23 MED ORDER — BACLOFEN 10 MG PO TABS
10.0000 mg | ORAL_TABLET | Freq: Three times a day (TID) | ORAL | Status: DC | PRN
  Filled 2011-12-23: qty 1

## 2011-12-23 MED ORDER — RANOLAZINE ER 500 MG PO TB12
1000.0000 mg | ORAL_TABLET | Freq: Two times a day (BID) | ORAL | Status: DC
  Administered 2011-12-23 – 2011-12-24 (×2): 1000 mg via ORAL
  Filled 2011-12-23 (×3): qty 2

## 2011-12-23 MED ORDER — ONDANSETRON HCL 4 MG/2ML IJ SOLN
INTRAMUSCULAR | Status: AC
  Filled 2011-12-23: qty 2

## 2011-12-23 MED ORDER — MIDAZOLAM HCL 2 MG/2ML IJ SOLN
INTRAMUSCULAR | Status: AC
  Filled 2011-12-23: qty 2

## 2011-12-23 MED ORDER — ATORVASTATIN CALCIUM 10 MG PO TABS
20.0000 mg | ORAL_TABLET | Freq: Every day | ORAL | Status: DC
  Administered 2011-12-23 – 2011-12-24 (×2): 20 mg via ORAL
  Filled 2011-12-23 (×2): qty 2

## 2011-12-23 MED ORDER — LIDOCAINE HCL (PF) 1 % IJ SOLN
INTRAMUSCULAR | Status: AC
  Filled 2011-12-23: qty 30

## 2011-12-23 NOTE — Progress Notes (Signed)
TO THE CATH LAB BY BED, STABLE, REPORT GIVEN TO RN. 

## 2011-12-23 NOTE — Interval H&P Note (Signed)
History and Physical Interval Note:  12/23/2011 10:34 AM  Crystal Clark  has presented today for surgery, with the diagnosis of chest pain  The various methods of treatment have been discussed with the patient. After consideration of risks, benefits and other options for treatment, the patient has consented to  Procedure(s): LEFT HEART CATHETERIZATION WITH CORONARY ANGIOGRAM as a surgical intervention .  The patients' history has been reviewed, patient examined, no change in status, stable for surgery.  I have reviewed the patients' chart and labs.  Questions were answered to the patient's satisfaction.     Lorine Bears

## 2011-12-23 NOTE — H&P (View-Only) (Signed)
 SUBJECTIVE:  Still with some mild chest discomfort.  No SOB   PHYSICAL EXAM Filed Vitals:   12/23/11 0200 12/23/11 0300 12/23/11 0400 12/23/11 0500  BP: 97/57 94/58 95/59 98/64  Pulse: 66 65 65 67  Temp:   97.9 F (36.6 C)   TempSrc:   Oral   Resp: 12 12 12 13  Height:      Weight:      SpO2: 97% 96% 96% 97%   General:  No distress Lungs:  Clear Heart:  RRR, no rub, no murmur Abdomen:  Positive bowel sounds, no rebound no guarding Extremities:  No edema  LABS: Lab Results  Component Value Date   CKTOTAL 58 12/23/2011   CKMB 1.8 12/23/2011   TROPONINI <0.30 12/23/2011   Results for orders placed during the hospital encounter of 12/22/11 (from the past 24 hour(s))  CBC     Status: Normal   Collection Time   12/22/11  4:59 PM      Component Value Range   WBC 6.0  4.0 - 10.5 (K/uL)   RBC 4.52  3.87 - 5.11 (MIL/uL)   Hemoglobin 14.3  12.0 - 15.0 (g/dL)   HCT 42.4  36.0 - 46.0 (%)   MCV 93.8  78.0 - 100.0 (fL)   MCH 31.6  26.0 - 34.0 (pg)   MCHC 33.7  30.0 - 36.0 (g/dL)   RDW 13.2  11.5 - 15.5 (%)   Platelets 328  150 - 400 (K/uL)  BASIC METABOLIC PANEL     Status: Abnormal   Collection Time   12/22/11  4:59 PM      Component Value Range   Sodium 135  135 - 145 (mEq/L)   Potassium 4.1  3.5 - 5.1 (mEq/L)   Chloride 97  96 - 112 (mEq/L)   CO2 24  19 - 32 (mEq/L)   Glucose, Bld 104 (*) 70 - 99 (mg/dL)   BUN 10  6 - 23 (mg/dL)   Creatinine, Ser 0.65  0.50 - 1.10 (mg/dL)   Calcium 9.8  8.4 - 10.5 (mg/dL)   GFR calc non Af Amer >90  >90 (mL/min)   GFR calc Af Amer >90  >90 (mL/min)  POCT I-STAT TROPONIN I     Status: Normal   Collection Time   12/22/11  6:00 PM      Component Value Range   Troponin i, poc 0.00  0.00 - 0.08 (ng/mL)   Comment 3           MRSA PCR SCREENING     Status: Normal   Collection Time   12/22/11  9:16 PM      Component Value Range   MRSA by PCR NEGATIVE  NEGATIVE   CARDIAC PANEL(CRET KIN+CKTOT+MB+TROPI)     Status: Normal   Collection Time   12/22/11 10:21 PM      Component Value Range   Total CK 62  7 - 177 (U/L)   CK, MB 1.8  0.3 - 4.0 (ng/mL)   Troponin I <0.30  <0.30 (ng/mL)   Relative Index RELATIVE INDEX IS INVALID  0.0 - 2.5   PROTIME-INR     Status: Normal   Collection Time   12/22/11 10:21 PM      Component Value Range   Prothrombin Time 14.5  11.6 - 15.2 (seconds)   INR 1.11  0.00 - 1.49   APTT     Status: Abnormal   Collection Time   12/22/11 10:21 PM        Component Value Range   aPTT 183 (*) 24 - 37 (seconds)  CARDIAC PANEL(CRET KIN+CKTOT+MB+TROPI)     Status: Normal   Collection Time   12/23/11 12:00 AM      Component Value Range   Total CK 58  7 - 177 (U/L)   CK, MB 1.8  0.3 - 4.0 (ng/mL)   Troponin I <0.30  <0.30 (ng/mL)   Relative Index RELATIVE INDEX IS INVALID  0.0 - 2.5     Intake/Output Summary (Last 24 hours) at 12/23/11 0638 Last data filed at 12/23/11 0500  Gross per 24 hour  Intake    211 ml  Output      0 ml  Net    211 ml    ASSESSMENT AND PLAN:  1)  Chest pain:  Similar to previous unstable angina.  Plan cath today  2)  Dysplipidemia:  Chest fasting lipids.  Continue current therapy for now.  3)  HTN:  Controlled on meds as listed.   Crystal Clark 12/23/2011 6:38 AM   

## 2011-12-23 NOTE — Op Note (Signed)
Cardiac Catheterization Procedure Note  Name: Crystal Clark MRN: [id number] DOB: [birth year]  Procedure: Left Heart Cath, Selective Coronary Angiography, LV angiography  Indication: Unstable angina. This is a 56 year old female with known history of coronary artery disease status post angioplasty and Cypher stent placement to the mid left anterior descending artery in 2006. Her most recent cardiac catheterization showed stable 40% in-stent restenosis. She presented with increased chest tightness similar to her previous angina. Her previous symptoms were controlled with Ranexa 500 mg twice daily.   Medications:  Sedation:  1 mg IV Versed.    Procedural details: The right groin was prepped, draped, and anesthetized with 1% lidocaine. Using modified Seldinger technique, a 5 French sheath was introduced into the right femoral artery. Standard Judkins catheters were used for coronary angiography and left ventriculography. Catheter exchanges were performed over a guidewire. There were no immediate procedural complications. The patient was transferred to the post catheterization recovery area for further monitoring.   Procedural Findings:  Hemodynamics: AO:  105/64/81   mmHg LV:  106/2    mmHg LVEDP: 7  mmHg  Coronary angiography: Coronary dominance: Right   Left Main:  The vessel is normal in size and free of significant disease.  Left Anterior Descending (LAD):  Normal in size with no significant calcifications. The stent is noted in the midsegment which has diffuse 40% instent restenosis. The rest of the vessel is free of significant disease.  1st diagonal (D1):  Small in size without significant disease.  2nd diagonal (D2):  Small in size with mild ostial disease.  3rd diagonal (D3):  Very small in size.  Circumflex (LCx):  Normal in size and nondominant. The vessel has minor irregularities without evidence of obstructive disease.  1st obtuse marginal:  Large in size with very  mild proximal disease.  2nd obtuse marginal:  Very Small in size  3rd obtuse marginal:  Very small in size.   Right Coronary Artery: Normal in size and dominant. There is a mild 10% discrete proximal stenosis. The rest of the vessel has minor irregularities without obstructive disease.  posterior descending artery: Large in size and free of significant disease.  posterior lateral branch:  2 medium-size branches which are free of significant disease. Left ventriculography: Left ventricular systolic function is normal , LVEF is estimated at 55-60 %, there is no significant mitral regurgitation   Final Conclusions:   1. Significant 1 vessel coronary artery disease with patent stent in the mid left anterior descending artery with only mild 40% instent restenosis which does not seem to be different from her most recent angiography. No evidence of obstructive disease otherwise. 2. Normal LV systolic function.  Recommendations: I recommend continuing medical therapy. The patient seems to be having typical anginal symptoms. Her previous symptoms responded to Ranexa. Thus, I recommend increasing the dose to 1000mg  twice daily. Continue aggressive medical therapy.  Lorine Bears MD, [facility] 12/23/2011, 11:13 AM

## 2011-12-23 NOTE — Progress Notes (Signed)
NOT COMING BACK TO 2900, BELONGINGS ENDORSED TO THE STAFF FROM THE HOLDING ROOM.

## 2011-12-23 NOTE — Progress Notes (Signed)
SUBJECTIVE:  Still with some mild chest discomfort.  No SOB   PHYSICAL EXAM Filed Vitals:   12/23/11 0200 12/23/11 0300 12/23/11 0400 12/23/11 0500  BP: 97/57 94/58 95/59  98/64  Pulse: 66 65 65 67  Temp:   97.9 F (36.6 C)   TempSrc:   Oral   Resp: 12 12 12 13   Height:      Weight:      SpO2: 97% 96% 96% 97%   General:  No distress Lungs:  Clear Heart:  RRR, no rub, no murmur Abdomen:  Positive bowel sounds, no rebound no guarding Extremities:  No edema  LABS: Lab Results  Component Value Date   CKTOTAL 58 12/23/2011   CKMB 1.8 12/23/2011   TROPONINI <0.30 12/23/2011   Results for orders placed during the hospital encounter of 12/22/11 (from the past 24 hour(s))  CBC     Status: Normal   Collection Time   12/22/11  4:59 PM      Component Value Range   WBC 6.0  4.0 - 10.5 (K/uL)   RBC 4.52  3.87 - 5.11 (MIL/uL)   Hemoglobin 14.3  12.0 - 15.0 (g/dL)   HCT 09.8  11.9 - 14.7 (%)   MCV 93.8  78.0 - 100.0 (fL)   MCH 31.6  26.0 - 34.0 (pg)   MCHC 33.7  30.0 - 36.0 (g/dL)   RDW 82.9  56.2 - 13.0 (%)   Platelets 328  150 - 400 (K/uL)  BASIC METABOLIC PANEL     Status: Abnormal   Collection Time   12/22/11  4:59 PM      Component Value Range   Sodium 135  135 - 145 (mEq/L)   Potassium 4.1  3.5 - 5.1 (mEq/L)   Chloride 97  96 - 112 (mEq/L)   CO2 24  19 - 32 (mEq/L)   Glucose, Bld 104 (*) 70 - 99 (mg/dL)   BUN 10  6 - 23 (mg/dL)   Creatinine, Ser 8.65  0.50 - 1.10 (mg/dL)   Calcium 9.8  8.4 - 78.4 (mg/dL)   GFR calc non Af Amer >90  >90 (mL/min)   GFR calc Af Amer >90  >90 (mL/min)  POCT I-STAT TROPONIN I     Status: Normal   Collection Time   12/22/11  6:00 PM      Component Value Range   Troponin i, poc 0.00  0.00 - 0.08 (ng/mL)   Comment 3           MRSA PCR SCREENING     Status: Normal   Collection Time   12/22/11  9:16 PM      Component Value Range   MRSA by PCR NEGATIVE  NEGATIVE   CARDIAC PANEL(CRET KIN+CKTOT+MB+TROPI)     Status: Normal   Collection Time   12/22/11 10:21 PM      Component Value Range   Total CK 62  7 - 177 (U/L)   CK, MB 1.8  0.3 - 4.0 (ng/mL)   Troponin I <0.30  <0.30 (ng/mL)   Relative Index RELATIVE INDEX IS INVALID  0.0 - 2.5   PROTIME-INR     Status: Normal   Collection Time   12/22/11 10:21 PM      Component Value Range   Prothrombin Time 14.5  11.6 - 15.2 (seconds)   INR 1.11  0.00 - 1.49   APTT     Status: Abnormal   Collection Time   12/22/11 10:21 PM  Component Value Range   aPTT 183 (*) 24 - 37 (seconds)  CARDIAC PANEL(CRET KIN+CKTOT+MB+TROPI)     Status: Normal   Collection Time   12/23/11 12:00 AM      Component Value Range   Total CK 58  7 - 177 (U/L)   CK, MB 1.8  0.3 - 4.0 (ng/mL)   Troponin I <0.30  <0.30 (ng/mL)   Relative Index RELATIVE INDEX IS INVALID  0.0 - 2.5     Intake/Output Summary (Last 24 hours) at 12/23/11 1610 Last data filed at 12/23/11 0500  Gross per 24 hour  Intake    211 ml  Output      0 ml  Net    211 ml    ASSESSMENT AND PLAN:  1)  Chest pain:  Similar to previous unstable angina.  Plan cath today  2)  Dysplipidemia:  Chest fasting lipids.  Continue current therapy for now.  3)  HTN:  Controlled on meds as listed.   Crystal Clark 12/23/2011 6:38 AM

## 2011-12-23 NOTE — Progress Notes (Signed)
ANTICOAGULATION CONSULT NOTE - Follow Up Consult  Pharmacy Consult for Heparin Indication: chest pain/ACS  Allergies  Allergen Reactions  . Simvastatin     "makes MS worse"    Patient Measurements: Height: 5\' 2"  (157.5 cm) Weight: 102 lb 15.3 oz (46.7 kg) IBW/kg (Calculated) : 50.1  Adjusted Body Weight:    Vital Signs: Temp: 97.9 F (36.6 C) (01/09 0400) Temp src: Oral (01/09 0400) BP: 98/64 mmHg (01/09 0500) Pulse Rate: 67  (01/09 0500)  Labs:  Basename 12/23/11 0540 12/23/11 12/22/11 2221 12/22/11 1659  HGB 11.8* -- -- 14.3  HCT 35.5* -- -- 42.4  PLT 292 -- -- 328  APTT -- -- 183* --  LABPROT -- -- 14.5 --  INR -- -- 1.11 --  HEPARINUNFRC 0.39 -- -- --  CREATININE -- -- -- 0.65  CKTOTAL -- 58 62 --  CKMB -- 1.8 1.8 --  TROPONINI -- <0.30 <0.30 --   Estimated Creatinine Clearance: 60 ml/min (by C-G formula based on Cr of 0.65).   Medications:  Scheduled:    . amLODipine  5 mg Oral Daily  . aspirin  324 mg Oral Once  . aspirin  324 mg Oral Pre-Cath  . aspirin EC  81 mg Oral Daily  . aspirin EC  81 mg Oral Daily  . atorvastatin  20 mg Oral Daily  . buPROPion  150 mg Oral Daily  . clopidogrel  75 mg Oral Daily  . diazepam  5 mg Oral On Call  . ezetimibe  10 mg Oral Daily  . Fingolimod HCl  1 capsule Oral Daily  . heparin  600 Units/hr Intravenous To Major  . heparin  2,900 Units Intravenous Once  . isosorbide mononitrate  15 mg Oral BID  . loratadine  10 mg Oral Daily  . pantoprazole  40 mg Oral Q1200  . ranolazine  500 mg Oral BID  . sodium chloride  3 mL Intravenous Q12H  . sodium chloride  3 mL Intravenous Q12H  . DISCONTD: aspirin  324 mg Oral NOW  . DISCONTD: aspirin  300 mg Rectal NOW  . DISCONTD: baclofen  10 mg Oral TID  . DISCONTD: heparin  5,000 Units Intravenous Once  . DISCONTD: simvastatin  20 mg Oral Daily   Infusions:    . sodium chloride 1 mL/kg/hr (12/23/11 0400)  . heparin 600 Units/hr (12/22/11 2233)  . nitroGLYCERIN 15  mcg/min (12/22/11 2233)  . DISCONTD: nitroGLYCERIN 15 mcg/min (12/22/11 1907)    Assessment: Heparin for CP/ACS. Heparin level 0.39 in goal this am. Hgb dropped to 11.9. Plan cath today.  Goal of Therapy:  Heparin level 0.3-0.7 units/ml   Plan: Heparin  infusion rate 600 units/hr  Canton Yearby, [employer] 12/23/2011,6:58 AM

## 2011-12-24 DIAGNOSIS — R079 Chest pain, unspecified: Secondary | ICD-10-CM

## 2011-12-24 LAB — CBC
HCT: 36.7 % (ref 36.0–46.0)
Hemoglobin: 12.3 g/dL (ref 12.0–15.0)
MCH: 31.6 pg (ref 26.0–34.0)
MCHC: 33.5 g/dL (ref 30.0–36.0)
MCV: 94.3 fL (ref 78.0–100.0)
Platelets: 272 10*3/uL (ref 150–400)
RBC: 3.89 MIL/uL (ref 3.87–5.11)
RDW: 13.2 % (ref 11.5–15.5)
WBC: 3.7 10*3/uL — ABNORMAL LOW (ref 4.0–10.5)

## 2011-12-24 MED ORDER — NITROGLYCERIN 0.4 MG SL SUBL: 0.4000 mg | SUBLINGUAL_TABLET | SUBLINGUAL | Status: DC | PRN

## 2011-12-24 MED ORDER — RANOLAZINE ER 1000 MG PO TB12: 1000.0000 mg | ORAL_TABLET | Freq: Two times a day (BID) | ORAL | Status: DC

## 2011-12-24 NOTE — Discharge Summary (Signed)
Patient seen and examined.  Plan as discussed in my rounding note for today and outlined above. Fayrene Fearing [facility]  12/24/2011  12:56 PM

## 2011-12-24 NOTE — Progress Notes (Signed)
Patient Name: Crystal Clark Date of Encounter: 12/24/2011     Principal Problem:  *Chest pain Active Problems:  CAD (coronary artery disease)  HTN (hypertension)  Hyperlipidemia    SUBJECTIVE: Pt reports chest pain has improved. Denies SOB, lightheadedness, diaphoresis, palpitations and n/v. Would like to go home.   OBJECTIVE  Filed Vitals:   12/23/11 1016 12/23/11 1456 12/23/11 2222 12/24/11 0509  BP:  102/70 104/71 104/72  Pulse: 72 71 66 66  Temp:  98.1 F (36.7 C) 98.1 F (36.7 C) 97.1 F (36.2 C)  TempSrc:   Oral Oral  Resp:   18 16  Height:      Weight:      SpO2:  98% 98% 96%    Intake/Output Summary (Last 24 hours) at 12/24/11 0744 Last data filed at 12/24/11 0600  Gross per 24 hour  Intake  634.5 ml  Output   2100 ml  Net -1465.5 ml   Weight change:   PHYSICAL EXAM  General: Thin, in no acute distress. Head: Normocephalic, atraumatic, sclera non-icteric, no xanthomas Neck: Supple without bruits or JVD. Lungs:  Resp regular and unlabored, CTAB without wheezes, rales or rhonchi Heart: RRR no s3, s4, or murmurs. Abdomen: Soft, non-tender, non-distended, BS + x 4.  Msk:  Strength and tone appears decreased for age. Extremities: No clubbing, cyanosis or edema. DP/PT/Radials 2+ and equal bilaterally. Neuro: Alert and oriented X 3. Moves all extremities spontaneously. Psych: Normal affect.  LABS:  Recent Labs  [facility] 12/24/11 0520 12/23/11 0540   WBC 3.7* 4.1   HGB 12.3 11.8*   HCT 36.7 35.5*   MCV 94.3 93.4   PLT 272 292    Lab 12/23/11 0540 12/22/11 1659  NA 140 135  K 3.7 4.1  CL 104 97  CO2 26 24  BUN 10 10  CREATININE 0.65 0.65  CALCIUM 9.3 9.8  PROT -- --  BILITOT -- --  ALKPHOS -- --  ALT -- --  AST -- --  AMYLASE -- --  LIPASE -- --  GLUCOSE 81 104*   Recent Labs  Basename 12/22/11 2221   HGBA1C 5.7*   Recent Labs  [facility] 12/23/11 0540 12/23/11 12/22/11 2221   CKTOTAL 53 58 62   CKMB 1.7 1.8 1.8   CKMBINDEX  -- -- --   TROPONINI <0.30 <0.30 <0.30   No components found with this basename: POCBNP Recent Labs  Basename 12/23/11 0540   CHOL 250*   HDL 84   LDLCALC 152*   TRIG 71   CHOLHDL 3.0   LDLDIRECT --   TELE: NSR, 60-80 bpm  ECG: NSR, 66 bpm, no ST-T wave changes   Radiology/Studies:  Dg Chest 2 View  12/22/2011  *RADIOLOGY REPORT*  Clinical Data: Left chest pain history of coronary disease  CHEST - 2 VIEW  Comparison: None.  Findings: Normal heart size and vascularity.  Negative for CHF, pneumonia, collapse, consolidation, effusion or pneumothorax. Trachea midline.  Symmetric lower chest nodular densities compatible with nipple shadows, this could be confirmed with repeat exam including markers.  Trachea is midline.  Mild thoracic degenerative changes.  Coronary stents noted.  IMPRESSION: No acute chest finding.  Original Report Authenticated By: Judie Petit. Ruel Favors, M.D.    Current Medications:     . amLODipine  5 mg Oral Daily  . aspirin EC  81 mg Oral Daily  . atorvastatin  20 mg Oral Daily  . buPROPion  150 mg Oral Daily  . clopidogrel  75 mg Oral Daily  . diazepam  5 mg Oral On Call  . ezetimibe  10 mg Oral Daily  . Fingolimod HCl  1 capsule Oral Daily  . heparin      . isosorbide mononitrate  15 mg Oral BID  . lidocaine      . loratadine  10 mg Oral Daily  . midazolam      . nitroGLYCERIN      . pantoprazole  40 mg Oral Q1200  . ranolazine  1,000 mg Oral BID  . DISCONTD: aspirin EC  81 mg Oral Daily  . DISCONTD: ranolazine  500 mg Oral BID  . DISCONTD: sodium chloride  3 mL Intravenous Q12H  . DISCONTD: sodium chloride  3 mL Intravenous Q12H    ASSESSMENT AND PLAN:  1. Unstable angina- s/p cardiac cath yesterday revealing unchanged mild 40% instent restenosis of mid LAD stent and no evidence of obstructive disease otherwise. LV function normal. Chest pain has improved this morning. Pt tells me she has Raynaud's and migraines. Consider a concomitant vasospastic  etiology to chest pain differential. Does smoke cigarettes intermittently. She has a history of CAD, HL and premature family hx of CAD.    - Will continue medical management (ASA 81, Plavix, statin, NTG SL PRN)   - BB held due to sinus bradycardia   - Ranexa increased to 1000mg  BID  - Currently on Norvasc, Imdur  - Stressed the importance of abstaining from tobacco use as this can exacerbation coronary vasoconstriction  - Will continue aggressive risk reduction  2. HTN- very well-controlled. Pt tells me she does not have HTN; however, takes CCB, long-acting nitrate  - Continue outpatient meds  3. Dyslipidemia- LDL and TC elevated. Intolerant to Zocor. Goal LDL < 70.  - On Lipitor, Zetia at home.   - Will discuss increasing statin with LFTs outpatient  4. Multiple sclerosis- stable  Disposition- appears stable, today, no acute events overnight. Will discuss discharge with Dr. Antoine Poche.   Signed, R. Hurman Horn, PA-C 12/24/2011, 7:44 AM  Patient seen and examined.  Plan as discussed above.  OK to discharge today.  Continue medical management and risk reduction.  Exam as above.  Right groin without hematoma or echymosis.  She has scheduled follow up with me.   Fayrene Fearing [place]ke Outman  12/24/2011  11:11 AM

## 2011-12-24 NOTE — Discharge Summary (Signed)
Discharge Summary   Patient ID: Crystal Clark,  MRN: [id number], DOB/AGE: 1956-07-17 56 y.o.  Admit date: 12/22/2011 Discharge date: 12/24/2011  Discharge Diagnoses Principal Problem:  *Chest pain Active Problems:  CAD (coronary artery disease)  HTN (hypertension)  Hyperlipidemia  Allergies Allergies  Allergen Reactions  . Simvastatin     "makes MS worse"   Procedures  Cardiac Catheterization  Hemodynamics:  AO: 105/64/81 mmHg  LV: 106/2 mmHg  LVEDP: 7 mmHg  Coronary angiography:  Coronary dominance: Right  Left Main: The vessel is normal in size and free of significant disease.  Left Anterior Descending (LAD): Normal in size with no significant calcifications. The stent is noted in the midsegment which has diffuse 40% instent restenosis. The rest of the vessel is free of significant disease.  1st diagonal (D1): Small in size without significant disease.  2nd diagonal (D2): Small in size with mild ostial disease.  3rd diagonal (D3): Very small in size.  Circumflex (LCx): Normal in size and nondominant. The vessel has minor irregularities without evidence of obstructive disease.  1st obtuse marginal: Large in size with very mild proximal disease.  2nd obtuse marginal: Very Small in size  3rd obtuse marginal: Very small in size.  Right Coronary Artery: Normal in size and dominant. There is a mild 10% discrete proximal stenosis. The rest of the vessel has minor irregularities without obstructive disease.  posterior descending artery: Large in size and free of significant disease.  posterior lateral branch: 2 medium-size branches which are free of significant disease. Left ventriculography: Left ventricular systolic function is normal , LVEF is estimated at 55-60 %, there is no significant mitral regurgitation  Final Conclusions:  1. Significant 1 vessel coronary artery disease with patent stent in the mid left anterior descending artery with only mild 40% instent  restenosis which does not seem to be different from her most recent angiography. No evidence of obstructive disease otherwise.  2. Normal LV systolic function.  Recommendations: I recommend continuing medical therapy. The patient seems to be having typical anginal symptoms. Her previous symptoms responded to Ranexa. Thus, I recommend increasing the dose to 1000mg  twice daily. Continue aggressive medical therapy.  History of Present Illness  Crystal Clark is a 56 yo Caucasian female with PMHx significant for CAD (s/p 2006: stent to LAD, 2010: patent stent with 40% in-stent restenosis, followed by cardiology at [facility]), HTN, HL and MS. She is currently transferring her care to Dr. Antoine Poche in an effort to follow-up with a cardiologist close to home. She has been compliant on medical therapy.   Her angina had progressively worsened over the last month with the last 3 days prior to admission being the worse. Her angina was described as someone squeezing her heart. The prior 3 days she has had associated left arm numbness, N, and diaphoresis. 1 episode awoke her from her sleep approximately 3 days prior. The pain has been relieved by nitro until today. On 01/07, she felt increased fatigue, worse than her typical MS fatigue. The morning of admission, she awoke and had the squeezing sensation all day. She has not had relief with nitro. When the pain did not resolve she came to the ER for further evaluation.   She was placed on nitroglycerin drip which helped ease the squeezing sensation but with some residual pressure across the front of her chest. Troponin is negative x1. EKG SR without acute changes. Vitals are stable. Cardiology was asked to admit the patient for further evaluation.  Hospital Course  Her symptoms and HPI were concerning for unstable angina, especially given her hx of CAD and premature family hx of CAD. The decision was made to perform repeat cardiac catheterization, scheduled the  following day. She was informed, consented and agreed to the procedure which revealed the above results including significant CAD with patent stent in mid LAD with mild 40% instent restenosis, unchanged from prior, recent angiography. No evidence of obstructive lesion and normal LV systolic function. The recommendation was made by Dr. Kirke Corin to continue aggressive medical management, risk reduction and increase Ranexa to 1000mg  BID. She tolerated the procedure well.   Her groin site remained stable without increased pain, swelling, bleeding, pus or bruit concerning for hematoma or pseudoaneurysm. CEs remained negative, VSS and she remained in NSR. Her chest pain improved throughout the admission.   Today she is stable, at baseline and will be discharged home with up-titrated Ranexa dosing. She has Raynaud's and migraines. There may be a component of concomitant vasospasm that contributes to her chest pain. She will continue home medications including Norvasc and Imdur. She smokes cigarettes intermittently. It was stressed that she quit entirely as smoking can exacerbate coronary vasospasms. She will follow-up with Dr. Antoine Poche as an outpatient later this month for continued cardiac management.   Discharge Vitals:  Blood pressure 104/72, pulse 66, temperature 97.1 F (36.2 C), temperature source Oral, resp. rate 16, height 5\' 2"  (1.575 m), weight 46.7 kg (102 lb 15.3 oz), SpO2 96.00%.   Labs: Recent Labs  Basename 12/24/11 0520 12/23/11 0540   WBC 3.7* 4.1   HGB 12.3 11.8*   HCT 36.7 35.5*   MCV 94.3 93.4   PLT 272 292   Lab 12/23/11 0540 12/22/11 1659  NA 140 135  K 3.7 4.1  CL 104 97  CO2 26 24  BUN 10 10  CREATININE 0.65 0.65  CALCIUM 9.3 9.8  PROT -- --  BILITOT -- --  ALKPHOS -- --  ALT -- --  AST -- --  AMYLASE -- --  LIPASE -- --  GLUCOSE 81 104*   Recent Labs  Basename 12/22/11 2221   HGBA1C 5.7*   Recent Labs  [facility] 12/23/11 0540 12/23/11 12/22/11 2221   CKTOTAL 53  58 62   CKMB 1.7 1.8 1.8   CKMBINDEX -- -- --   TROPONINI <0.30 <0.30 <0.30    Recent Labs  Basename 12/23/11 0540   CHOL 250*   HDL 84   LDLCALC 152*   TRIG 71   CHOLHDL 3.0   LDLDIRECT --   Disposition:  Discharge Orders    Future Appointments: Provider: Department: Dept Phone: Center:   01/05/2012 2:30 PM Rollene Rotunda, MD [facility] [phone] [id number]     Follow-up Information    Follow up with Rollene Rotunda, MD. (Please follow-up with already scheduled appointment. )    Contact information:   [address], [address] [place] [address] [phone]         Discharge Medications:  Medication List  As of 12/24/2011 12:08 PM   START taking these medications         nitroGLYCERIN 0.4 MG SL tablet   Commonly known as: NITROSTAT   Place 1 tablet (0.4 mg total) under the tongue every 5 (five) minutes as needed for chest pain.         CHANGE how you take these medications         ranolazine 1000 MG  SR tablet   Commonly known as: RANEXA   Take 1 tablet (1,000 mg total) by mouth 2 (two) times daily.   What changed: - medication strength - dose         CONTINUE taking these medications         albuterol 108 (90 BASE) MCG/ACT inhaler   Commonly known as: PROVENTIL HFA;VENTOLIN HFA      ALPRAZolam 0.25 MG tablet   Commonly known as: XANAX      amLODipine 5 MG tablet   Commonly known as: NORVASC      aspirin EC 81 MG tablet      atorvastatin 20 MG tablet   Commonly known as: LIPITOR      baclofen 10 MG tablet   Commonly known as: LIORESAL      clopidogrel 75 MG tablet   Commonly known as: PLAVIX      eszopiclone 2 MG Tabs   Commonly known as: LUNESTA      ezetimibe 10 MG tablet   Commonly known as: ZETIA      GILENYA 0.5 MG Caps   Generic drug: Fingolimod HCl      isosorbide mononitrate 30 MG 24 hr tablet   Commonly known as: IMDUR      lidocaine 5 %   Commonly known as: LIDODERM       loratadine 10 MG tablet   Commonly known as: CLARITIN      omeprazole 20 MG capsule   Commonly known as: PRILOSEC      traMADol 50 MG tablet   Commonly known as: ULTRAM      WELLBUTRIN SR 150 MG 12 hr tablet   Generic drug: buPROPion          Where to get your medications    These are the prescriptions that you need to pick up.   You may get these medications from any pharmacy.         nitroGLYCERIN 0.4 MG SL tablet   ranolazine 1000 MG SR tablet           Outstanding Labs/Studies: None   Duration of Discharge Encounter: 40 minutes including physician time.  Signed, R. Hurman Horn, PA-C 12/24/2011, 12:08 PM

## 2011-12-25 NOTE — ED Provider Notes (Signed)
Medical screening examination/treatment/procedure(s) were performed by non-physician practitioner and as supervising physician I was immediately available for consultation/collaboration.  Geoffery Lyons, MD 12/25/11 [phone]

## 2012-01-05 ENCOUNTER — Ambulatory Visit (INDEPENDENT_AMBULATORY_CARE_PROVIDER_SITE_OTHER): Payer: Managed Care, Other (non HMO) | Admitting: Cardiology

## 2012-01-05 ENCOUNTER — Encounter: Payer: Self-pay | Admitting: Cardiology

## 2012-01-05 DIAGNOSIS — I10 Essential (primary) hypertension: Secondary | ICD-10-CM

## 2012-01-05 DIAGNOSIS — I2510 Atherosclerotic heart disease of native coronary artery without angina pectoris: Secondary | ICD-10-CM

## 2012-01-05 MED ORDER — ISOSORBIDE MONONITRATE ER 30 MG PO TB24: 30.0000 mg | ORAL_TABLET | Freq: Every day | ORAL | Status: DC

## 2012-01-05 NOTE — Assessment & Plan Note (Signed)
I am going to have her start taking her Imdur 30 mg once in the morning with her aspirin a half-hour before to see if this helps with her headache. In addition she understands she needs a nitrate free interval. If she continues to have headaches she can go down to 15 mg daily. Of note we did increase her Ranexa in the hospital. This higher dose as necessary from a medical standpoint to manage her continued angina.

## 2012-01-05 NOTE — Progress Notes (Signed)
HPI The patient is status post recent hospitalization for chest pain.  She did have a cardiac catheterization demonstrating 40% mild in-stent restenosis but no other obstructive coronary disease. Since discharge she has continued to have significant fatigue. This is worse than previous and she relates it to her MS. She had one episode of chest pain at night requiring 2 sublingual nitroglycerin. She is mostly complaining of a significant headache which she relates to the medications which she takes in the morning. Of note she's taking her Imdur twice a day.  Allergies  Allergen Reactions  . Simvastatin     "makes MS worse"    Current Outpatient Prescriptions  Medication Sig Dispense Refill  . albuterol (PROVENTIL HFA;VENTOLIN HFA) 108 (90 BASE) MCG/ACT inhaler Inhale 2 puffs into the lungs every 4 (four) hours as needed. For shortness of breath       . ALPRAZolam (XANAX) 0.25 MG tablet Take 0.0625 mg by mouth at bedtime as needed. For sleep       . amLODipine (NORVASC) 5 MG tablet Take 5 mg by mouth daily.        Marland Kitchen aspirin EC 81 MG tablet Take 81 mg by mouth daily.        Marland Kitchen atorvastatin (LIPITOR) 20 MG tablet Take 20 mg by mouth daily.       . baclofen (LIORESAL) 10 MG tablet Take 10 mg by mouth 3 (three) times daily.        Marland Kitchen buPROPion (WELLBUTRIN SR) 150 MG 12 hr tablet Take 150 mg by mouth daily.        . clopidogrel (PLAVIX) 75 MG tablet Take 75 mg by mouth daily.        . eszopiclone (LUNESTA) 2 MG TABS Take 2 mg by mouth at bedtime. Take immediately before bedtime       . ezetimibe (ZETIA) 10 MG tablet Take 10 mg by mouth daily.        . Fingolimod HCl (GILENYA) 0.5 MG CAPS Take 1 capsule by mouth daily.        . isosorbide mononitrate (IMDUR) 30 MG 24 hr tablet Take 15 mg by mouth 2 (two) times daily.        Marland Kitchen lidocaine (LIDODERM) 5 % Place 2 patches onto the skin daily as needed. For pain Remove & Discard patch within 12 hours or as directed by MD       . loratadine (CLARITIN) 10  MG tablet Take 10 mg by mouth daily.        . nitroGLYCERIN (NITROSTAT) 0.4 MG SL tablet Place 1 tablet (0.4 mg total) under the tongue every 5 (five) minutes as needed for chest pain.  25 tablet  3  . omeprazole (PRILOSEC) 20 MG capsule Take 20 mg by mouth daily.        . ranolazine (RANEXA) 1000 MG SR tablet Take 1 tablet (1,000 mg total) by mouth 2 (two) times daily.  60 tablet  3  . traMADol (ULTRAM) 50 MG tablet Take 50-100 mg by mouth every 6 (six) hours as needed. For pain         Past Medical History  Diagnosis Date  . Coronary artery disease     s/p stent to LAD in 2006  . Angina at rest   . MS (multiple sclerosis)     since 1997  . Hypertension   . Dyslipidemia     No past surgical history on file.  ROS:  As stated in the HPI  and negative for all other systems.  PHYSICAL EXAM BP 125/75  Pulse 72  Ht 5\' 2"  (1.575 m)  Wt 109 lb (49.442 kg)  BMI 19.94 kg/m2 GENERAL:  Well appearing NECK:  No jugular venous distention, waveform within normal limits, carotid upstroke brisk and symmetric, no bruits, no thyromegaly LYMPHATICS:  No cervical, inguinal adenopathy LUNGS:  Clear to auscultation bilaterally BACK:  No CVA tenderness CHEST:  Unremarkable HEART:  PMI not displaced or sustained,S1 and S2 within normal limits, no S3, no S4, no clicks, no rubs, no murmurs ABD:  Flat, positive bowel sounds normal in frequency in pitch, no bruits, no rebound, no guarding, no midline pulsatile mass, no hepatomegaly, no splenomegaly EXT:  2 plus pulses throughout, no edema, no cyanosis no clubbing  ASSESSMENT AND PLAN

## 2012-01-05 NOTE — Assessment & Plan Note (Signed)
The blood pressure is at target. No change in medications is indicated. We will continue with therapeutic lifestyle changes (TLC).  

## 2012-01-05 NOTE — Patient Instructions (Signed)
Please take Imdur 30 mg once a day with your Asa 30 mins before.  Continue all other medications as listed  Follow up in 4 months with Dr Antoine Poche.  You will receive a letter in the mail 2 months before you are due.  Please call us when you receive this letter to schedule your follow up appointment.

## 2012-04-04 ENCOUNTER — Other Ambulatory Visit: Payer: Self-pay

## 2012-04-04 ENCOUNTER — Other Ambulatory Visit: Payer: Self-pay | Admitting: Cardiology

## 2012-04-04 MED ORDER — EZETIMIBE 10 MG PO TABS: 10.0000 mg | ORAL_TABLET | Freq: Every day | ORAL | Status: DC

## 2012-04-04 MED ORDER — RANOLAZINE ER 1000 MG PO TB12: 1000.0000 mg | ORAL_TABLET | Freq: Two times a day (BID) | ORAL | Status: DC

## 2012-04-04 MED ORDER — CLOPIDOGREL BISULFATE 75 MG PO TABS: 75.0000 mg | ORAL_TABLET | Freq: Every day | ORAL | Status: DC

## 2012-04-04 MED ORDER — ATORVASTATIN CALCIUM 20 MG PO TABS: 20.0000 mg | ORAL_TABLET | Freq: Every day | ORAL | Status: DC

## 2012-04-04 MED ORDER — AMLODIPINE BESYLATE 5 MG PO TABS: 5.0000 mg | ORAL_TABLET | Freq: Every day | ORAL | Status: DC

## 2012-04-04 NOTE — Telephone Encounter (Signed)
..   Requested Prescriptions   Pending Prescriptions Disp Refills  . ranolazine (RANEXA) 1000 MG SR tablet 60 tablet 6    Sig: Take 1 tablet (1,000 mg total) by mouth 2 (two) times daily.  . clopidogrel (PLAVIX) 75 MG tablet 30 tablet 6    Sig: Take 1 tablet (75 mg total) by mouth daily.  Marland Kitchen amLODipine (NORVASC) 5 MG tablet 30 tablet 6    Sig: Take 1 tablet (5 mg total) by mouth daily.  Marland Kitchen atorvastatin (LIPITOR) 20 MG tablet 30 tablet 6    Sig: Take 1 tablet (20 mg total) by mouth daily.  Marland Kitchen ezetimibe (ZETIA) 10 MG tablet 60 tablet 6    Sig: Take 1 tablet (10 mg total) by mouth daily.

## 2012-04-04 NOTE — Telephone Encounter (Signed)
Refill- Verified [facility] [address] Patient can be reached at hm# for additional info.

## 2012-04-04 NOTE — Telephone Encounter (Signed)
..   Requested Prescriptions   Signed Prescriptions Disp Refills  . ranolazine (RANEXA) 1000 MG SR tablet 60 tablet 6    Sig: Take 1 tablet (1,000 mg total) by mouth 2 (two) times daily.    Authorizing Provider: Rollene Rotunda    Ordering User: Athalee Esterline M  Had to refill this med, did not go through the first time.

## 2012-05-12 ENCOUNTER — Encounter: Payer: Self-pay | Admitting: Cardiology

## 2012-05-12 ENCOUNTER — Ambulatory Visit (INDEPENDENT_AMBULATORY_CARE_PROVIDER_SITE_OTHER): Payer: Managed Care, Other (non HMO) | Admitting: Cardiology

## 2012-05-12 VITALS — BP 130/75 | HR 75 | Ht 60.0 in | Wt 106.1 lb

## 2012-05-12 DIAGNOSIS — I2510 Atherosclerotic heart disease of native coronary artery without angina pectoris: Secondary | ICD-10-CM

## 2012-05-12 DIAGNOSIS — I10 Essential (primary) hypertension: Secondary | ICD-10-CM

## 2012-05-12 DIAGNOSIS — E785 Hyperlipidemia, unspecified: Secondary | ICD-10-CM

## 2012-05-12 NOTE — Patient Instructions (Signed)
The current medical regimen is effective;  continue present plan and medications.  Follow up in 6 months with Dr Hochrein.  You will receive a letter in the mail 2 months before you are due.  Please call us when you receive this letter to schedule your follow up appointment.  

## 2012-05-12 NOTE — Progress Notes (Signed)
HPI The patient presents for follow up of CAD. Since I last saw her she has had a couple of episodes a requiring nitroglycerin but nothing as significant as that which hospitalized her.  She has not been able to increase her Ranexa dose as she is trying to get the increased dose approved by her insurance company. She is still getting some headaches after taking her Imdur. She denies any new shortness of breath, PND or orthopnea. He tries to be active though she has some limitations from her multiple sclerosis and she has fallen.   Allergies  Allergen Reactions  . Simvastatin     "makes MS worse"    Current Outpatient Prescriptions  Medication Sig Dispense Refill  . ALPRAZolam (XANAX) 0.25 MG tablet Take 0.0625 mg by mouth at bedtime as needed. For sleep       . amLODipine (NORVASC) 5 MG tablet Take 1 tablet (5 mg total) by mouth daily.  30 tablet  6  . aspirin EC 81 MG tablet Take 81 mg by mouth daily.        Marland Kitchen atorvastatin (LIPITOR) 20 MG tablet Take 1 tablet (20 mg total) by mouth daily.  30 tablet  6  . baclofen (LIORESAL) 10 MG tablet Take 10 mg by mouth 3 (three) times daily. As needed       . buPROPion (WELLBUTRIN SR) 150 MG 12 hr tablet Take 150 mg by mouth daily.        . clopidogrel (PLAVIX) 75 MG tablet Take 1 tablet (75 mg total) by mouth daily.  30 tablet  6  . eszopiclone (LUNESTA) 2 MG TABS Take 2 mg by mouth at bedtime. Take immediately before bedtime       . ezetimibe (ZETIA) 10 MG tablet Take 1 tablet (10 mg total) by mouth daily.  60 tablet  6  . Fingolimod HCl (GILENYA) 0.5 MG CAPS Take 1 capsule by mouth daily.        . isosorbide mononitrate (IMDUR) 30 MG 24 hr tablet Take 1 tablet (30 mg total) by mouth daily.  30 tablet  6  . lidocaine (LIDODERM) 5 % Place 2 patches onto the skin daily as needed. For pain Remove & Discard patch within 12 hours or as directed by MD       . loratadine (CLARITIN) 10 MG tablet Take 10 mg by mouth daily.        . nitroGLYCERIN  (NITROSTAT) 0.4 MG SL tablet Place 1 tablet (0.4 mg total) under the tongue every 5 (five) minutes as needed for chest pain.  25 tablet  3  . omeprazole (PRILOSEC) 20 MG capsule Take 20 mg by mouth daily.        . ranolazine (RANEXA) 500 MG 12 hr tablet Take 500 mg by mouth 2 (two) times daily.      . traMADol (ULTRAM) 50 MG tablet Take 50-100 mg by mouth every 6 (six) hours as needed. For pain         Past Medical History  Diagnosis Date  . Coronary artery disease     s/p stent to LAD in 2006, 40% in stent restenosis 12/23/11  . Angina at rest   . MS (multiple sclerosis)     since 1997  . Hypertension   . Dyslipidemia     Past Surgical History  Procedure Date  . Cesarean section     x2  . Knee cartilage surgery     ROS:  As stated in  the HPI and negative for all other systems.  PHYSICAL EXAM BP 130/75  Pulse 75  Ht 5' (1.524 m)  Wt 106 lb 1.9 oz (48.136 kg)  BMI 20.73 kg/m2 GENERAL:  Well appearing NECK:  No jugular venous distention, waveform within normal limits, carotid upstroke brisk and symmetric, no bruits, no thyromegaly LYMPHATICS:  No cervical, inguinal adenopathy LUNGS:  Clear to auscultation bilaterally BACK:  No CVA tenderness CHEST:  Unremarkable HEART:  PMI not displaced or sustained,S1 and S2 within normal limits, no S3, no S4, no clicks, no rubs, no murmurs ABD:  Flat, positive bowel sounds normal in frequency in pitch, no bruits, no rebound, no guarding, no midline pulsatile mass, no hepatomegaly, no splenomegaly EXT:  2 plus pulses throughout, no edema, no cyanosis no clubbing  EKG:  Sinus rhythm, rate 75, axis within normal limits, intervals within normal limits, RSR prime V1 and V2, LVH by voltage criteria, no acute ST-T wave changes. 05/12/2012   ASSESSMENT AND PLAN

## 2012-05-12 NOTE — Assessment & Plan Note (Signed)
She will discontinue the Imdur when she gets her higher dose of Ranexa.  We will see if this combination controls the angina without causing headaches.

## 2012-05-12 NOTE — Assessment & Plan Note (Signed)
The blood pressure is at target. No change in medications is indicated. We will continue with therapeutic lifestyle changes (TLC).  

## 2012-05-12 NOTE — Assessment & Plan Note (Signed)
Her LDL in January was 152.  I will repeat this before making any medication changes.

## 2012-11-02 ENCOUNTER — Encounter: Payer: Self-pay | Admitting: Cardiology

## 2012-11-02 ENCOUNTER — Ambulatory Visit (INDEPENDENT_AMBULATORY_CARE_PROVIDER_SITE_OTHER): Payer: Managed Care, Other (non HMO) | Admitting: Cardiology

## 2012-11-02 VITALS — BP 116/72 | HR 80 | Ht 62.0 in | Wt 107.0 lb

## 2012-11-02 DIAGNOSIS — I10 Essential (primary) hypertension: Secondary | ICD-10-CM

## 2012-11-02 DIAGNOSIS — E785 Hyperlipidemia, unspecified: Secondary | ICD-10-CM

## 2012-11-02 DIAGNOSIS — I2510 Atherosclerotic heart disease of native coronary artery without angina pectoris: Secondary | ICD-10-CM

## 2012-11-02 MED ORDER — LORATADINE 10 MG PO TABS: 10.0000 mg | ORAL_TABLET | Freq: Every day | ORAL | Status: DC

## 2012-11-02 MED ORDER — OMEPRAZOLE 20 MG PO CPDR: 20.0000 mg | DELAYED_RELEASE_CAPSULE | Freq: Every day | ORAL | Status: DC

## 2012-11-02 MED ORDER — AMLODIPINE BESYLATE 5 MG PO TABS: 5.0000 mg | ORAL_TABLET | Freq: Every day | ORAL | Status: DC

## 2012-11-02 MED ORDER — CLOPIDOGREL BISULFATE 75 MG PO TABS: 75.0000 mg | ORAL_TABLET | Freq: Every day | ORAL | Status: DC

## 2012-11-02 MED ORDER — ATORVASTATIN CALCIUM 20 MG PO TABS: 20.0000 mg | ORAL_TABLET | Freq: Every day | ORAL | Status: DC

## 2012-11-02 MED ORDER — EZETIMIBE 10 MG PO TABS: 10.0000 mg | ORAL_TABLET | Freq: Every day | ORAL | Status: DC

## 2012-11-02 MED ORDER — RANOLAZINE ER 500 MG PO TB12: 500.0000 mg | ORAL_TABLET | Freq: Two times a day (BID) | ORAL | Status: DC

## 2012-11-02 MED ORDER — BUPROPION HCL ER (SR) 150 MG PO TB12: 150.0000 mg | ORAL_TABLET | Freq: Every day | ORAL | Status: DC

## 2012-11-02 NOTE — Progress Notes (Signed)
HPI The patient presents for follow up of CAD. Since I last saw her she has had a couple of episodes a requiring nitroglycerin.  However, this seems to have been a stable pattern. She said she was doing quite well when she was taking Ranexa.  However she ran out of this medication. She says she cannot tolerate Imdur as it causes a headache. She denies any new shortness of breath, PND or orthopnea. He tries to be active though she has some limitations from her multiple sclerosis.  She also is limited by some back pain.  Allergies  Allergen Reactions  . Simvastatin     "makes MS worse"    Current Outpatient Prescriptions  Medication Sig Dispense Refill  . ALPRAZolam (XANAX) 0.25 MG tablet Take 0.0625 mg by mouth at bedtime as needed. For sleep       . amLODipine (NORVASC) 5 MG tablet Take 1 tablet (5 mg total) by mouth daily.  30 tablet  6  . aspirin EC 81 MG tablet Take 81 mg by mouth daily.        Marland Kitchen atorvastatin (LIPITOR) 20 MG tablet Take 1 tablet (20 mg total) by mouth daily.  30 tablet  6  . buPROPion (WELLBUTRIN SR) 150 MG 12 hr tablet Take 150 mg by mouth daily.        . clopidogrel (PLAVIX) 75 MG tablet Take 1 tablet (75 mg total) by mouth daily.  30 tablet  6  . ezetimibe (ZETIA) 10 MG tablet Take 1 tablet (10 mg total) by mouth daily.  60 tablet  6  . Fingolimod HCl (GILENYA) 0.5 MG CAPS Take 1 capsule by mouth daily.        . isosorbide mononitrate (IMDUR) 30 MG 24 hr tablet Take 30 mg by mouth daily. Take 1/2 tablet daily      . lidocaine (LIDODERM) 5 % Place 2 patches onto the skin daily as needed. For pain Remove & Discard patch within 12 hours or as directed by MD       . loratadine (CLARITIN) 10 MG tablet Take 10 mg by mouth daily.        . nitroGLYCERIN (NITROSTAT) 0.4 MG SL tablet Place 1 tablet (0.4 mg total) under the tongue every 5 (five) minutes as needed for chest pain.  25 tablet  3  . omeprazole (PRILOSEC) 20 MG capsule Take 20 mg by mouth daily.        . ranolazine  (RANEXA) 500 MG 12 hr tablet Take 500 mg by mouth 2 (two) times daily.      . traMADol (ULTRAM) 50 MG tablet Take 50-100 mg by mouth every 6 (six) hours as needed. For pain       . zolpidem (AMBIEN CR) 6.25 MG CR tablet 6.25 mg. Take 1 to 2 tabs per day at bed time        Past Medical History  Diagnosis Date  . Coronary artery disease     s/p stent to LAD in 2006, 40% in stent restenosis 12/23/11  . Angina at rest   . MS (multiple sclerosis)     since 1997  . Hypertension   . Dyslipidemia     Past Surgical History  Procedure Date  . Cesarean section     x2  . Knee cartilage surgery     ROS:  As stated in the HPI and negative for all other systems.  PHYSICAL EXAM BP 116/72  Pulse 80  Ht 5\' 2"  (1.575  m)  Wt 107 lb (48.535 kg)  BMI 19.57 kg/m2  SpO2 99% GENERAL:  Well appearing NECK:  No jugular venous distention, waveform within normal limits, carotid upstroke brisk and symmetric, no bruits, no thyromegaly LYMPHATICS:  No cervical, inguinal adenopathy LUNGS:  Clear to auscultation bilaterally BACK:  No CVA tenderness CHEST:  Unremarkable HEART:  PMI not displaced or sustained,S1 and S2 within normal limits, no S3, no S4, no clicks, no rubs, no murmurs ABD:  Flat, positive bowel sounds normal in frequency in pitch, no bruits, no rebound, no guarding, no midline pulsatile mass, no hepatomegaly, no splenomegaly EXT:  2 plus pulses throughout, no edema, no cyanosis no clubbing  EKG:  Sinus rhythm, rate 79, axis within normal limits, intervals within normal limits, RSR prime V1 and V2, no acute ST-T wave changes. 11/02/2012   ASSESSMENT AND PLAN  CAD (coronary artery disease) -  She will go ahead and stop the Imdur. She did well in the 500 twice a day of Ranexa and she will continue with this. We will see if this combination controls the angina without causing headaches.  HTN (hypertension) -  The blood pressure is at target. No change in medications is indicated. We will  continue with therapeutic lifestyle changes (TLC).   Hyperlipidemia -  Her LDL in January was 152. I will repeat this with a lipid profile.   Tobacco abuse  - We again discussed the need to stop smoking. She says with significant stress at home she does not think she would be able to do this.

## 2012-11-02 NOTE — Patient Instructions (Addendum)
Please stop your Isosorbide.  Continue all other medications as listed.  Please review for fasting blood work.   Follow up in 6 months with Dr Antoine Poche.  You will receive a letter in the mail 2 months before you are due.  Please call us when you receive this letter to schedule your follow up appointment.  Please sign up for My Chart.  Instructions are included.

## 2012-11-04 ENCOUNTER — Other Ambulatory Visit: Payer: Managed Care, Other (non HMO)

## 2012-12-31 ENCOUNTER — Other Ambulatory Visit: Payer: Self-pay | Admitting: Cardiology

## 2013-01-07 ENCOUNTER — Other Ambulatory Visit: Payer: Self-pay | Admitting: Cardiology

## 2013-02-15 ENCOUNTER — Encounter: Payer: Self-pay | Admitting: Gastroenterology

## 2013-03-06 ENCOUNTER — Telehealth: Payer: Self-pay | Admitting: Internal Medicine

## 2013-03-06 MED ORDER — DOXYCYCLINE HYCLATE 100 MG PO TABS: ORAL_TABLET | ORAL | Status: DC

## 2013-03-06 NOTE — Telephone Encounter (Signed)
Acute sinusitis. Z pak to [facility] [address]

## 2013-03-06 NOTE — Telephone Encounter (Signed)
Script for doxycycline sent to [facility] [address] for URI

## 2013-03-13 ENCOUNTER — Other Ambulatory Visit: Payer: Self-pay | Admitting: Internal Medicine

## 2013-03-13 MED ORDER — AZITHROMYCIN 250 MG PO TABS: 250.0000 mg | ORAL_TABLET | Freq: Every day | ORAL | Status: DC

## 2013-03-13 NOTE — Telephone Encounter (Signed)
Per Cy ok for zpak rx sent  lmovm

## 2013-05-17 ENCOUNTER — Telehealth: Payer: Self-pay | Admitting: Cardiology

## 2013-05-17 NOTE — Telephone Encounter (Signed)
Pt called to set up labs for tomorrow, wants an appt with hochrein or pa for high bp last reading last night at 11p 138/100 dystolic yesterday was 100, all schedules full, pls advise

## 2013-05-17 NOTE — Telephone Encounter (Signed)
Spoke with pt who states she has not been feeling well over the past several weeks and the last 3 to 4 days has had an elevated diastolic BP of around 100.  She would like to be seen prior to Dr Hochrein's 1st available appt. Gave pt an appt for 6/17 at 12:10 with Tereso Newcomer, PA.  She is aware I will call her with a cancellation should one become available.  Cell # is [phone].

## 2013-05-18 ENCOUNTER — Other Ambulatory Visit (INDEPENDENT_AMBULATORY_CARE_PROVIDER_SITE_OTHER): Payer: Managed Care, Other (non HMO)

## 2013-05-18 DIAGNOSIS — E785 Hyperlipidemia, unspecified: Secondary | ICD-10-CM

## 2013-05-18 LAB — HEPATIC FUNCTION PANEL
ALT: 41 U/L — ABNORMAL HIGH (ref 0–35)
AST: 29 U/L (ref 0–37)
Albumin: 3.9 g/dL (ref 3.5–5.2)
Alkaline Phosphatase: 71 U/L (ref 39–117)
Bilirubin, Direct: 0 mg/dL (ref 0.0–0.3)
Total Bilirubin: 0.4 mg/dL (ref 0.3–1.2)
Total Protein: 6.7 g/dL (ref 6.0–8.3)

## 2013-05-18 LAB — LIPID PANEL
Cholesterol: 181 mg/dL (ref 0–200)
HDL: 74.7 mg/dL (ref >39.00)
LDL Cholesterol: 81 mg/dL (ref 0–99)
Total CHOL/HDL Ratio: 2
Triglycerides: 125 mg/dL (ref 0.0–149.0)
VLDL: 25 mg/dL (ref 0.0–40.0)

## 2013-05-18 NOTE — Telephone Encounter (Signed)
Moved appt up - Pt scheduled to be seen by Dr Antoine Poche 6/10 - she is aware.

## 2013-05-19 ENCOUNTER — Ambulatory Visit: Payer: Managed Care, Other (non HMO) | Admitting: Cardiology

## 2013-05-23 ENCOUNTER — Encounter: Payer: Self-pay | Admitting: Cardiology

## 2013-05-23 ENCOUNTER — Ambulatory Visit (INDEPENDENT_AMBULATORY_CARE_PROVIDER_SITE_OTHER): Payer: Managed Care, Other (non HMO)

## 2013-05-23 ENCOUNTER — Encounter: Payer: Self-pay | Admitting: *Deleted

## 2013-05-23 ENCOUNTER — Ambulatory Visit (INDEPENDENT_AMBULATORY_CARE_PROVIDER_SITE_OTHER): Payer: Managed Care, Other (non HMO) | Admitting: Cardiology

## 2013-05-23 VITALS — BP 150/85 | HR 85 | Ht 62.0 in | Wt 104.0 lb

## 2013-05-23 DIAGNOSIS — R42 Dizziness and giddiness: Secondary | ICD-10-CM

## 2013-05-23 DIAGNOSIS — I2510 Atherosclerotic heart disease of native coronary artery without angina pectoris: Secondary | ICD-10-CM

## 2013-05-23 DIAGNOSIS — I10 Essential (primary) hypertension: Secondary | ICD-10-CM

## 2013-05-23 DIAGNOSIS — R002 Palpitations: Secondary | ICD-10-CM

## 2013-05-23 DIAGNOSIS — R079 Chest pain, unspecified: Secondary | ICD-10-CM

## 2013-05-23 NOTE — Patient Instructions (Signed)
The current medical regimen is effective;  continue present plan and medications.  Your physician has recommended that you wear a holter monitor for 48 hours. Holter monitors are medical devices that record the heart's electrical activity. Doctors most often use these monitors to diagnose arrhythmias. Arrhythmias are problems with the speed or rhythm of the heartbeat. The monitor is a small, portable device. You can wear one while you do your normal daily activities. This is usually used to diagnose what is causing palpitations/syncope (passing out).  Follow up with Dr Antoine Poche after monitor testing.

## 2013-05-23 NOTE — Progress Notes (Signed)
HPI The patient presents for follow up of CAD. Since I last saw her she has had no episodes of angina requiring nitroglycerin.  However, she has had some episodes of palpitations. She feels some chest fullness. She's had her heart pounding. His happens often at night but also has happened during the day. One time she went down to the ground but did not have loss of consciousness. She describes lightheadedness and has had vomiting with this. This has been happening about 3 or 4 times per week. It is not like her previous angina. She's not describing chest pressure, neck or arm discomfort. She's not having new PND or orthopnea or shortness of breath.  Allergies  Allergen Reactions  . Simvastatin     "makes MS worse"    Current Outpatient Prescriptions  Medication Sig Dispense Refill  . ALPRAZolam (XANAX) 0.25 MG tablet Take 0.0625 mg by mouth at bedtime as needed. For sleep       . amLODipine (NORVASC) 5 MG tablet Take 1 tablet (5 mg total) by mouth daily.  30 tablet  6  . aspirin EC 81 MG tablet Take 81 mg by mouth daily.        Marland Kitchen atorvastatin (LIPITOR) 20 MG tablet Take 1 tablet (20 mg total) by mouth daily.  30 tablet  6  . azithromycin (ZITHROMAX) 250 MG tablet Take 1 tablet (250 mg total) by mouth daily.  6 tablet  0  . buPROPion (WELLBUTRIN SR) 150 MG 12 hr tablet Take 1 tablet (150 mg total) by mouth daily.  30 tablet  6  . clopidogrel (PLAVIX) 75 MG tablet Take 1 tablet (75 mg total) by mouth daily.  30 tablet  6  . doxycycline (VIBRA-TABS) 100 MG tablet 2 today then one daily  8 tablet  0  . ezetimibe (ZETIA) 10 MG tablet Take 1 tablet (10 mg total) by mouth daily.  30 tablet  6  . Fingolimod HCl (GILENYA) 0.5 MG CAPS Take 1 capsule by mouth daily.        Marland Kitchen lidocaine (LIDODERM) 5 % Place 2 patches onto the skin daily as needed. For pain Remove & Discard patch within 12 hours or as directed by MD       . loratadine (CLARITIN) 10 MG tablet Take 1 tablet (10 mg total) by mouth daily.   30 tablet  6  . omeprazole (PRILOSEC) 20 MG capsule Take 1 capsule (20 mg total) by mouth daily.  30 capsule  6  . ranolazine (RANEXA) 500 MG 12 hr tablet Take 1 tablet (500 mg total) by mouth 2 (two) times daily.  60 tablet  6  . traMADol (ULTRAM) 50 MG tablet Take 50-100 mg by mouth every 6 (six) hours as needed. For pain       . zolpidem (AMBIEN CR) 6.25 MG CR tablet 6.25 mg. Take 1 to 2 tabs per day at bed time      . nitroGLYCERIN (NITROSTAT) 0.4 MG SL tablet Place 1 tablet (0.4 mg total) under the tongue every 5 (five) minutes as needed for chest pain.  25 tablet  3   No current facility-administered medications for this visit.    Past Medical History  Diagnosis Date  . Coronary artery disease     s/p stent to LAD in 2006, 40% in stent restenosis 12/23/11  . Angina at rest   . MS (multiple sclerosis)     since 1997  . Hypertension   . Dyslipidemia  Past Surgical History  Procedure Laterality Date  . Cesarean section      x2  . Knee cartilage surgery      ROS:  As stated in the HPI and negative for all other systems.  PHYSICAL EXAM BP 150/85  Pulse 85  Ht 5\' 2"  (1.575 m)  Wt 104 lb (47.174 kg)  BMI 19.02 kg/m2 GENERAL:  Well appearing, thin NECK:  No jugular venous distention, waveform within normal limits, carotid upstroke brisk and symmetric, no bruits, no thyromegaly LYMPHATICS:  No cervical, inguinal adenopathy LUNGS:  Clear to auscultation bilaterally BACK:  No CVA tenderness,, lordosis CHEST:  Unremarkable HEART:  PMI not displaced or sustained,S1 and S2 within normal limits, no S3, no S4, no clicks, no rubs, no murmurs ABD:  Flat, positive bowel sounds normal in frequency in pitch, no bruits, no rebound, no guarding, no midline pulsatile mass, no hepatomegaly, no splenomegaly EXT:  2 plus pulses throughout, no edema, no cyanosis no clubbing  EKG:  Sinus rhythm, rate 85, axis within normal limits, intervals within normal limits, RSR prime V1 and V2, no acute  ST-T wave changes. 05/23/2013   ASSESSMENT AND PLAN  Dizziness - The patient is having episodes of chest fullness and heart pounding. This is happening about 3 or 4 days per week. I will start by evaluating with a 48-hour Holter.  CAD (coronary artery disease) -  The above complaints do not seem to be similar to previous coronary disease. She had a catheterization which I reviewed from January of last year. She had no residual obstructive disease. No further imaging for this is indicated at this point.  HTN (hypertension) -  The blood pressure is at target. No change in medications is indicated. We will continue with therapeutic lifestyle changes (TLC).   Tobacco abuse  - She understands the need to stop smoking completely though she has cut down.

## 2013-05-24 NOTE — Progress Notes (Signed)
Place a 48 hr holter on patient [phone]

## 2013-05-30 ENCOUNTER — Ambulatory Visit: Payer: Managed Care, Other (non HMO) | Admitting: Physician Assistant

## 2013-05-31 ENCOUNTER — Encounter: Payer: Self-pay | Admitting: Cardiology

## 2013-05-31 ENCOUNTER — Ambulatory Visit (INDEPENDENT_AMBULATORY_CARE_PROVIDER_SITE_OTHER): Payer: Managed Care, Other (non HMO) | Admitting: Cardiology

## 2013-05-31 VITALS — BP 118/78 | HR 81 | Ht 62.0 in | Wt 105.0 lb

## 2013-05-31 DIAGNOSIS — R42 Dizziness and giddiness: Secondary | ICD-10-CM

## 2013-05-31 NOTE — Patient Instructions (Addendum)
The current medical regimen is effective;  continue present plan and medications.  Your physician has recommended that you wear an event monitor for 21 days. Event monitors are medical devices that record the heart's electrical activity. Doctors most often [place] these monitors to diagnose arrhythmias. Arrhythmias are problems with the speed or rhythm of the heartbeat. The monitor is a small, portable device. You can wear one while you do your normal daily activities. This is usually used to diagnose what is causing palpitations/syncope (passing out).   Follow up with Dr Antoine Poche after wearing the monitor.

## 2013-05-31 NOTE — Progress Notes (Signed)
HPI The patient presents for follow up of dizziness. I saw her recently for this. This is described elsewhere. She wore a 48-hour Holter to try to evaluate this. Unfortunately she had none of the symptoms while wearing the monitor. The monitor was essentially normal. However, very shortly after taking it off she had another episode of dizziness while lying in bed. This was similar to her previous complaints.  Allergies  Allergen Reactions  . Simvastatin     "makes MS worse"    Current Outpatient Prescriptions  Medication Sig Dispense Refill  . ALPRAZolam (XANAX) 0.25 MG tablet Take 0.0625 mg by mouth at bedtime as needed. For sleep       . amLODipine (NORVASC) 5 MG tablet Take 1 tablet (5 mg total) by mouth daily.  30 tablet  6  . aspirin EC 81 MG tablet Take 81 mg by mouth daily.        Marland Kitchen atorvastatin (LIPITOR) 20 MG tablet Take 1 tablet (20 mg total) by mouth daily.  30 tablet  6  . azithromycin (ZITHROMAX) 250 MG tablet Take 1 tablet (250 mg total) by mouth daily.  6 tablet  0  . buPROPion (WELLBUTRIN SR) 150 MG 12 hr tablet Take 1 tablet (150 mg total) by mouth daily.  30 tablet  6  . clopidogrel (PLAVIX) 75 MG tablet Take 1 tablet (75 mg total) by mouth daily.  30 tablet  6  . doxycycline (VIBRA-TABS) 100 MG tablet 2 today then one daily  8 tablet  0  . ezetimibe (ZETIA) 10 MG tablet Take 1 tablet (10 mg total) by mouth daily.  30 tablet  6  . Fingolimod HCl (GILENYA) 0.5 MG CAPS Take 1 capsule by mouth daily.        Marland Kitchen lidocaine (LIDODERM) 5 % Place 2 patches onto the skin daily as needed. For pain Remove & Discard patch within 12 hours or as directed by MD       . loratadine (CLARITIN) 10 MG tablet Take 1 tablet (10 mg total) by mouth daily.  30 tablet  6  . nitroGLYCERIN (NITROSTAT) 0.4 MG SL tablet Place 1 tablet (0.4 mg total) under the tongue every 5 (five) minutes as needed for chest pain.  25 tablet  3  . omeprazole (PRILOSEC) 20 MG capsule Take 1 capsule (20 mg total) by  mouth daily.  30 capsule  6  . ranolazine (RANEXA) 500 MG 12 hr tablet Take 1 tablet (500 mg total) by mouth 2 (two) times daily.  60 tablet  6  . traMADol (ULTRAM) 50 MG tablet Take 50-100 mg by mouth every 6 (six) hours as needed. For pain       . zolpidem (AMBIEN CR) 6.25 MG CR tablet 6.25 mg. Take 1 to 2 tabs per day at bed time       No current facility-administered medications for this visit.    Past Medical History  Diagnosis Date  . Coronary artery disease     s/p stent to LAD in 2006, 40% in stent restenosis 12/23/11  . Angina at rest   . MS (multiple sclerosis)     since 1997  . Hypertension   . Dyslipidemia     Past Surgical History  Procedure Laterality Date  . Cesarean section      x2  . Knee cartilage surgery      ROS:  As stated in the HPI and negative for all other systems.  PHYSICAL EXAM BP 118/78  Pulse 81  Ht 5\' 2"  (1.575 m)  Wt 105 lb (47.628 kg)  BMI 19.2 kg/m2 GENERAL:  Well appearing, thin NECK:  No jugular venous distention, waveform within normal limits, carotid upstroke brisk and symmetric, no bruits, no thyromegaly LUNGS:  Clear to auscultation bilaterally BACK:  No CVA tenderness,, lordosisI HEART:  PMI not displaced or sustained,S1 and S2 within normal limits, no S3, no S4, no clicks, no rubs, no murmurs ABD:  Flat, positive bowel sounds normal in frequency in pitch, no bruits, no rebound, no guarding, no midline pulsatile mass, no hepatomegaly, no splenomegaly EXT:  2 plus pulses throughout, no edema, no cyanosis no clubbing  EKG:  Sinus rhythm, rate 81, axis within normal limits, intervals within normal limits, RSR prime V1 and V2, no acute ST-T wave changes. 05/31/2013   ASSESSMENT AND PLAN  Dizziness - Unfortunately we were unable to capture the symptoms on 48 hour Holter. Therefore, she will have a 21 day event monitor.  She also has the means to check her blood sugar during these episodes and I suggested this.  CAD (coronary artery  disease) -  The above complaints do not seem to be similar to previous coronary disease. No further ischemia workup is indicated.  Tobacco abuse  - She understands the need to stop smoking completely though she has cut down.

## 2013-06-01 ENCOUNTER — Encounter: Payer: Self-pay | Admitting: *Deleted

## 2013-06-01 NOTE — Progress Notes (Signed)
Patient ID: Crystal Clark, female   DOB: [birth year], 57 y.o.   MRN: [id number] Patient enrolled for 21 day [facility].  [facility] to mail monitoring equipment directly to patient.

## 2013-06-12 ENCOUNTER — Encounter (INDEPENDENT_AMBULATORY_CARE_PROVIDER_SITE_OTHER): Payer: Managed Care, Other (non HMO)

## 2013-06-12 DIAGNOSIS — R42 Dizziness and giddiness: Secondary | ICD-10-CM

## 2013-06-22 ENCOUNTER — Other Ambulatory Visit: Payer: Self-pay | Admitting: Cardiology

## 2013-06-30 ENCOUNTER — Other Ambulatory Visit: Payer: Self-pay | Admitting: Specialist

## 2013-06-30 DIAGNOSIS — G35 Multiple sclerosis: Secondary | ICD-10-CM

## 2013-07-10 ENCOUNTER — Ambulatory Visit
Admission: RE | Admit: 2013-07-10 | Discharge: 2013-07-10 | Disposition: A | Discharge status: Home / Self Care | Payer: Managed Care, Other (non HMO) | Source: Ambulatory Visit | Attending: Specialist | Admitting: Specialist

## 2013-07-10 DIAGNOSIS — G35 Multiple sclerosis: Secondary | ICD-10-CM

## 2013-07-10 MED ORDER — GADOBENATE DIMEGLUMINE 529 MG/ML IV SOLN
9.0000 mL | Freq: Once | INTRAVENOUS | Status: AC | PRN
  Administered 2013-07-10: 9 mL via INTRAVENOUS

## 2013-07-24 ENCOUNTER — Other Ambulatory Visit: Payer: Self-pay | Admitting: Cardiology

## 2013-07-24 ENCOUNTER — Other Ambulatory Visit: Payer: Self-pay | Admitting: Specialist

## 2013-07-24 DIAGNOSIS — G35 Multiple sclerosis: Secondary | ICD-10-CM

## 2013-08-21 ENCOUNTER — Ambulatory Visit
Admission: RE | Admit: 2013-08-21 | Discharge: 2013-08-21 | Disposition: A | Discharge status: Home / Self Care | Payer: Managed Care, Other (non HMO) | Source: Ambulatory Visit | Attending: Specialist | Admitting: Specialist

## 2013-08-21 DIAGNOSIS — G35 Multiple sclerosis: Secondary | ICD-10-CM

## 2013-10-27 ENCOUNTER — Encounter: Payer: Self-pay | Admitting: Gastroenterology

## 2013-12-21 ENCOUNTER — Other Ambulatory Visit: Payer: Self-pay | Admitting: Cardiology

## 2013-12-25 NOTE — Telephone Encounter (Signed)
Does Dr Percival Spanish refill this for this patient? Thanks, MI

## 2014-05-15 ENCOUNTER — Other Ambulatory Visit: Payer: Self-pay

## 2014-05-15 MED ORDER — EZETIMIBE 10 MG PO TABS: ORAL_TABLET | ORAL | Status: DC

## 2014-05-15 MED ORDER — RANOLAZINE ER 500 MG PO TB12: ORAL_TABLET | ORAL | Status: DC

## 2014-05-15 MED ORDER — AMLODIPINE BESYLATE 5 MG PO TABS: ORAL_TABLET | ORAL | Status: DC

## 2014-05-15 MED ORDER — OMEPRAZOLE 20 MG PO CPDR: DELAYED_RELEASE_CAPSULE | ORAL | Status: DC

## 2014-05-15 MED ORDER — ATORVASTATIN CALCIUM 20 MG PO TABS: ORAL_TABLET | ORAL | Status: DC

## 2014-05-15 MED ORDER — CLOPIDOGREL BISULFATE 75 MG PO TABS: ORAL_TABLET | ORAL | Status: DC

## 2014-07-11 ENCOUNTER — Other Ambulatory Visit: Payer: Self-pay | Admitting: Cardiology

## 2014-07-11 ENCOUNTER — Encounter: Payer: Self-pay | Admitting: Cardiology

## 2014-07-11 ENCOUNTER — Encounter: Payer: Self-pay | Admitting: Interventional Cardiology

## 2014-07-11 ENCOUNTER — Ambulatory Visit
Admission: RE | Admit: 2014-07-11 | Discharge: 2014-07-11 | Disposition: A | Discharge status: Home / Self Care | Payer: Managed Care, Other (non HMO) | Source: Ambulatory Visit | Attending: Cardiology | Admitting: Cardiology

## 2014-07-11 ENCOUNTER — Ambulatory Visit (INDEPENDENT_AMBULATORY_CARE_PROVIDER_SITE_OTHER): Payer: Managed Care, Other (non HMO) | Admitting: Cardiology

## 2014-07-11 VITALS — BP 118/80 | HR 63 | Ht 62.0 in | Wt 104.9 lb

## 2014-07-11 DIAGNOSIS — E782 Mixed hyperlipidemia: Secondary | ICD-10-CM

## 2014-07-11 DIAGNOSIS — Z01818 Encounter for other preprocedural examination: Secondary | ICD-10-CM

## 2014-07-11 DIAGNOSIS — R5381 Other malaise: Secondary | ICD-10-CM

## 2014-07-11 DIAGNOSIS — R6889 Other general symptoms and signs: Secondary | ICD-10-CM

## 2014-07-11 DIAGNOSIS — D689 Coagulation defect, unspecified: Secondary | ICD-10-CM

## 2014-07-11 DIAGNOSIS — R5383 Other fatigue: Secondary | ICD-10-CM

## 2014-07-11 DIAGNOSIS — Z79899 Other long term (current) drug therapy: Secondary | ICD-10-CM

## 2014-07-11 DIAGNOSIS — I208 Other forms of angina pectoris: Secondary | ICD-10-CM

## 2014-07-11 LAB — BASIC METABOLIC PANEL
BUN: 5 mg/dL — ABNORMAL LOW (ref 6–23)
CO2: 28 mEq/L (ref 19–32)
Calcium: 9.5 mg/dL (ref 8.4–10.5)
Chloride: 95 mEq/L — ABNORMAL LOW (ref 96–112)
Creat: 0.63 mg/dL (ref 0.50–1.10)
Glucose, Bld: 85 mg/dL (ref 70–99)
Potassium: 4.1 mEq/L (ref 3.5–5.3)
Sodium: 133 mEq/L — ABNORMAL LOW (ref 135–145)

## 2014-07-11 LAB — PROTIME-INR
INR: 1.06 (ref <1.50)
Prothrombin Time: 13.8 seconds (ref 11.6–15.2)

## 2014-07-11 LAB — CBC
HCT: 37.8 % (ref 36.0–46.0)
Hemoglobin: 13.5 g/dL (ref 12.0–15.0)
MCH: 30.8 pg (ref 26.0–34.0)
MCHC: 35.7 g/dL (ref 30.0–36.0)
MCV: 86.3 fL (ref 78.0–100.0)
Platelets: 376 10*3/uL (ref 150–400)
RBC: 4.38 MIL/uL (ref 3.87–5.11)
RDW: 13.7 % (ref 11.5–15.5)
WBC: 10.6 10*3/uL — ABNORMAL HIGH (ref 4.0–10.5)

## 2014-07-11 LAB — LIPID PANEL
Cholesterol: 206 mg/dL — ABNORMAL HIGH (ref 0–200)
HDL: 88 mg/dL (ref >39)
LDL Cholesterol: 92 mg/dL (ref 0–99)
Total CHOL/HDL Ratio: 2.3 Ratio
Triglycerides: 129 mg/dL (ref <150)
VLDL: 26 mg/dL (ref 0–40)

## 2014-07-11 LAB — TSH: TSH: 1.397 u[IU]/mL (ref 0.350–4.500)

## 2014-07-11 LAB — APTT: aPTT: 30 seconds (ref 24–37)

## 2014-07-11 NOTE — Progress Notes (Signed)
Patient ID: Crystal Clark, female   DOB: 1956-03-09, 58 y.o.   MRN: [id number]    07/11/2014 Crystal Clark   1956-06-02  [id number]  Primary Physicia No primary provider on file. Primary Cardiologist: Dr. Percival Spanish  HPI:  Crystal Clark is a 58 y/o female, followed by Dr. Percival Spanish, with a history of multiple sclerosis, hypertension, hyperlipidemia, 30+ year history of ongoing tobacco abuse (smokes one pack per day) and known CAD, s/p PCI to the LAD in 2006 in [place]. She underwent a repeat LHC on 12/23/11 by Dr. Burt Knack in the setting of chest pain. She was found to have a patent LAD stent with only 40% ISR, this was essentially unchanged from prior. Medical therapy was recommended at that time.   She had been doing fairly well, up until 3 weeks ago when she started to develop chest pain symptoms very similar to her prior angina. She notes substernal chest pressure/squeezing sensation with discomfort radiating to her bilateral upper extremities. She states these episodes have felt as if someone was "sitting on my chest". He notes associated dyspnea and nausea. Her symptoms have been relieved with sublingual nitroglycerin almost immediately. Two nights ago, she had an episode that woke her from her sleep, relieved after 2 doses of sublingual nitroglycerin. Her episodes seem to be worsening each time they occur. She also notes significant decrease in exercise tolerance over the last 2-3 weeks. She has lack of energy and gives out very easily performing household chores. She denies dizziness, syncope/near-syncope and no melena. She is currently chest pain-free. Her EKG demonstrates normal sinus rhythm without any ischemic abnormalities.  Unfortunately, she continues to smoke one pack per day. However, she reports daily compliance with her medications including aspirin, Plavix, Lipitor and Ranexa. She has been intolerant to Imdur, due to previous interactions with her medications used to treat her  multiple sclerosis. Beta blocker therapy is contraindicated due to a history of Raynauds disease.  Current Outpatient Prescriptions  Medication Sig Dispense Refill  . amLODipine (NORVASC) 5 MG tablet TAKE 1 TABLET (5 MG TOTAL) BY MOUTH DAILY.  30 tablet  0  . aspirin EC 81 MG tablet Take 81 mg by mouth daily.        Marland Kitchen atorvastatin (LIPITOR) 20 MG tablet TAKE 1 TABLET (20 MG TOTAL) BY MOUTH DAILY.  30 tablet  0  . clopidogrel (PLAVIX) 75 MG tablet TAKE 1 TABLET BY MOUTH EVERY DAY  30 tablet  0  . Cyanocobalamin (VITAMIN B-12 IJ) Inject as directed once a week.      . ezetimibe (ZETIA) 10 MG tablet TAKE 1 TABLET (10 MG TOTAL) BY MOUTH DAILY.  30 tablet  0  . FLUoxetine (PROZAC) 20 MG tablet Take 1 tablet by mouth daily.      Marland Kitchen lidocaine (LIDODERM) 5 % Place 2 patches onto the skin daily as needed. For pain Remove & Discard patch within 12 hours or as directed by MD       . loratadine (CLARITIN) 10 MG tablet Take 1 tablet (10 mg total) by mouth daily.  30 tablet  6  . natalizumab (TYSABRI) 300 MG/15ML injection Inject into the vein every 30 (thirty) days.      . nitroGLYCERIN (NITROSTAT) 0.4 MG SL tablet Place 1 tablet (0.4 mg total) under the tongue every 5 (five) minutes as needed for chest pain.  25 tablet  3  . omeprazole (PRILOSEC) 20 MG capsule TAKE 1 CAPSULE (20 MG TOTAL) BY MOUTH DAILY.  30 capsule  0  . ranolazine (RANEXA) 500 MG 12 hr tablet TAKE 1 TABLET BY MOUTH TWICE A DAY  60 tablet  0  . traMADol (ULTRAM) 50 MG tablet Take 50-100 mg by mouth every 6 (six) hours as needed. For pain       . Vitamin D, Ergocalciferol, (DRISDOL) 50000 UNITS CAPS capsule Take 1 capsule by mouth once a week.      . zolpidem (AMBIEN CR) 6.25 MG CR tablet 6.25 mg. Take 1 to 2 tabs per day at bed time       No current facility-administered medications for this visit.    Allergies  Allergen Reactions  . Simvastatin     "makes MS worse"    History   Social History  . Marital Status: Married     Spouse Name: N/A    Number of Children: N/A  . Years of Education: N/A   Occupational History  . 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 Smoking status: Current Every Day Smoker -- 1.00 packs/day    Types: Cigars, Cigarettes  . Smokeless tobacco: Not on file  . Alcohol Use: No  . Drug Use: No  . Sexual Activity: Not on file   Other Topics Concern  . Not on file   Social History Narrative  . No narrative on file     Review of Systems: General: negative for chills, fever, night sweats or weight changes.  Cardiovascular: negative for chest pain, dyspnea on exertion, edema, orthopnea, palpitations, paroxysmal nocturnal dyspnea or shortness of breath Dermatological: negative for rash Respiratory: negative for cough or wheezing Urologic: negative for hematuria Abdominal: negative for nausea, vomiting, diarrhea, bright red blood per rectum, melena, or hematemesis Neurologic: negative for visual changes, syncope, or dizziness All other systems reviewed and are otherwise negative except as noted above.    Blood pressure 118/80, pulse 63, height 5\' 2"  (1.575 m), weight 104 lb 14.4 oz (47.582 kg).  General appearance: alert, cooperative and no distress Neck: no carotid bruit and no JVD Lungs: clear to auscultation bilaterally Heart: regular rate and rhythm, S1, S2 normal, no murmur, click, rub or gallop Extremities: no LEE Pulses: 2+ and symmetric Skin: warm and dry Neurologic: Grossly normal  EKG NSR 63 bpm. No ischemic changes  ASSESSMENT AND PLAN:   1. Unstable angina: Her symptoms are very similar to her prior anginal symptoms. She notes resting/nocturnal anginal like pain that has been gradually worsening and increasing in frequency over the last 3 weeks and have almost immediately been relieved with sublingual nitroglycerin. She has known CAD with prior stenting of the RCA in 2006. Her last left heart catheterization in 2013 revealed 40%  in-stent restenosis. However, since that time she has continued to smoke chronically, on average one pack per day. Given her recent symptoms, in the setting of continued tobacco abuse, along with her other risk factors including hyperlipidemia and hypertension, I feel that it is best to proceed with a diagnostic left heart catheterization for definitive reassessment of her coronary anatomy. If there is significant progression of disease regarding her in-stent restenosis, she may require intervention. If no significant change, we can further increase her Ranexa to 1000 mg BID. For now, continue aspirin, Plavix, Lipitor and Ranexa. We will avoid beta blocker therapy due to a history of Raynauds disease, as well as long-acting oral nitrates, as she has had prior interactions with her medications used to treat her multiple  sclerosis.  2. Hyperlipidemia: Her last lipid panel was over a year ago in June of 2014. This demonstrated significant improvement in LDL from 152 mg/dL down to 81 mg/dL. However, her triglycerides increased from 71 to 125. She has not eaten since midnight. Will obtain a fasting lipid panel today. For now continue current dose of Lipitor and Zetia.  3. Tobacco abuse: 30+ year history, smoking on average one pack per day.  Smoking cessation was strongly advised.  4. Hypertension: Diet controlled. Blood pressure in clinic today as a 110/80.   PLAN   Will plan for diagnostic outpatient left heart catheterization as outlined in detail above. For now, continue current medications as prescribed. We'll obtain a fasting lipid panel to reassess cholesterol levels. We'll also order a CBC and BMET for baseline labs prior to undergoing repeat catheterization. She has an adequate amount of sublingual nitroglycerin tablets at home.   Travell Desaulniers, [id number]-C 07/11/2014 10:49 AM

## 2014-07-11 NOTE — Patient Instructions (Signed)
Please have blood work done today. (Lipid, BMP)  Your physician has requested that you have a cardiac catheterization. Cardiac catheterization is used to diagnose and/or treat various heart conditions. Doctors may recommend this procedure for a number of different reasons. The most common reason is to evaluate chest pain. Chest pain can be a symptom of coronary artery disease (CAD), and cardiac catheterization can show whether plaque is narrowing or blocking your heart's arteries. This procedure is also used to evaluate the valves, as well as measure the blood flow and oxygen levels in different parts of your heart. For further information please visit [email or web address]. Please follow instruction sheet, as given.  You will be required to have the following tests prior to the procedure:  1. Blood work-the blood work can be done no more than 7 days prior to the procedure.  It can be done at any [facility] lab.  There is one downstairs on the first floor of this building and one in the [place] ([address])  2. Chest Xray-the chest xray order has already been placed at the [place].

## 2014-07-11 NOTE — Telephone Encounter (Signed)
This encounter was created in error - please disregard.

## 2014-07-12 ENCOUNTER — Encounter (HOSPITAL_COMMUNITY): Payer: Self-pay | Admitting: Pharmacy Technician

## 2014-07-18 ENCOUNTER — Encounter (HOSPITAL_COMMUNITY): Payer: Self-pay | Admitting: General Practice

## 2014-07-18 ENCOUNTER — Ambulatory Visit (HOSPITAL_COMMUNITY)
Admission: RE | Admit: 2014-07-18 | Discharge: 2014-07-19 | Disposition: A | Discharge status: Home / Self Care | Payer: Managed Care, Other (non HMO) | Source: Ambulatory Visit | Attending: Interventional Cardiology | Admitting: Interventional Cardiology

## 2014-07-18 ENCOUNTER — Encounter (HOSPITAL_COMMUNITY)
Admission: RE | Disposition: A | Discharge status: Home / Self Care | Payer: Managed Care, Other (non HMO) | Source: Ambulatory Visit | Attending: Interventional Cardiology

## 2014-07-18 DIAGNOSIS — Z9861 Coronary angioplasty status: Secondary | ICD-10-CM | POA: Diagnosis not present

## 2014-07-18 DIAGNOSIS — F17200 Nicotine dependence, unspecified, uncomplicated: Secondary | ICD-10-CM | POA: Insufficient documentation

## 2014-07-18 DIAGNOSIS — Z7982 Long term (current) use of aspirin: Secondary | ICD-10-CM | POA: Insufficient documentation

## 2014-07-18 DIAGNOSIS — I2510 Atherosclerotic heart disease of native coronary artery without angina pectoris: Secondary | ICD-10-CM | POA: Diagnosis present

## 2014-07-18 DIAGNOSIS — Y849 Medical procedure, unspecified as the cause of abnormal reaction of the patient, or of later complication, without mention of misadventure at the time of the procedure: Secondary | ICD-10-CM | POA: Diagnosis not present

## 2014-07-18 DIAGNOSIS — I208 Other forms of angina pectoris: Secondary | ICD-10-CM

## 2014-07-18 DIAGNOSIS — E785 Hyperlipidemia, unspecified: Secondary | ICD-10-CM | POA: Diagnosis not present

## 2014-07-18 DIAGNOSIS — I10 Essential (primary) hypertension: Secondary | ICD-10-CM

## 2014-07-18 DIAGNOSIS — I25119 Atherosclerotic heart disease of native coronary artery with unspecified angina pectoris: Secondary | ICD-10-CM

## 2014-07-18 DIAGNOSIS — I209 Angina pectoris, unspecified: Secondary | ICD-10-CM | POA: Diagnosis not present

## 2014-07-18 DIAGNOSIS — G35 Multiple sclerosis: Secondary | ICD-10-CM | POA: Diagnosis not present

## 2014-07-18 HISTORY — DX: Anemia, unspecified: D64.9

## 2014-07-18 HISTORY — DX: Raynaud's syndrome without gangrene: I73.00

## 2014-07-18 HISTORY — DX: Dorsalgia, unspecified: M54.9

## 2014-07-18 HISTORY — DX: Gastro-esophageal reflux disease without esophagitis: K21.9

## 2014-07-18 HISTORY — DX: Other chronic pain: G89.29

## 2014-07-18 HISTORY — PX: LEFT HEART CATHETERIZATION WITH CORONARY ANGIOGRAM: SHX5451

## 2014-07-18 HISTORY — DX: Personal history of other medical treatment: Z92.89

## 2014-07-18 HISTORY — PX: CORONARY ANGIOPLASTY WITH STENT PLACEMENT: SHX49

## 2014-07-18 HISTORY — DX: Pain in thoracic spine: M54.6

## 2014-07-18 LAB — POCT ACTIVATED CLOTTING TIME: Activated Clotting Time: 473 seconds

## 2014-07-18 SURGERY — LEFT HEART CATHETERIZATION WITH CORONARY ANGIOGRAM
Anesthesia: LOCAL

## 2014-07-18 MED ORDER — FLUOXETINE HCL 20 MG PO TABS
20.0000 mg | ORAL_TABLET | Freq: Every day | ORAL | Status: DC
  Filled 2014-07-18: qty 1

## 2014-07-18 MED ORDER — HEPARIN SODIUM (PORCINE) 1000 UNIT/ML IJ SOLN
INTRAMUSCULAR | Status: AC
  Filled 2014-07-18: qty 1

## 2014-07-18 MED ORDER — ATORVASTATIN CALCIUM 20 MG PO TABS
20.0000 mg | ORAL_TABLET | Freq: Every day | ORAL | Status: DC
  Administered 2014-07-18: 20 mg via ORAL
  Filled 2014-07-18 (×2): qty 1

## 2014-07-18 MED ORDER — RANOLAZINE ER 500 MG PO TB12
500.0000 mg | ORAL_TABLET | Freq: Two times a day (BID) | ORAL | Status: DC
  Administered 2014-07-18 – 2014-07-19 (×2): 500 mg via ORAL
  Filled 2014-07-18 (×4): qty 1

## 2014-07-18 MED ORDER — MIDAZOLAM HCL 2 MG/2ML IJ SOLN
INTRAMUSCULAR | Status: AC
  Filled 2014-07-18: qty 2

## 2014-07-18 MED ORDER — ONDANSETRON HCL 4 MG/2ML IJ SOLN
INTRAMUSCULAR | Status: AC
  Filled 2014-07-18: qty 2

## 2014-07-18 MED ORDER — CLOPIDOGREL BISULFATE 75 MG PO TABS
ORAL_TABLET | ORAL | Status: AC
  Filled 2014-07-18: qty 1

## 2014-07-18 MED ORDER — CLOPIDOGREL BISULFATE 75 MG PO TABS
75.0000 mg | ORAL_TABLET | Freq: Every day | ORAL | Status: DC
  Administered 2014-07-18: 22:00:00 75 mg via ORAL
  Filled 2014-07-18: qty 1

## 2014-07-18 MED ORDER — VITAMIN D (ERGOCALCIFEROL) 1.25 MG (50000 UNIT) PO CAPS
50000.0000 [IU] | ORAL_CAPSULE | ORAL | Status: DC
  Filled 2014-07-18: qty 1

## 2014-07-18 MED ORDER — SODIUM CHLORIDE 0.9 % IJ SOLN: 3.0000 mL | INTRAMUSCULAR | Status: DC | PRN

## 2014-07-18 MED ORDER — LIDOCAINE HCL (PF) 1 % IJ SOLN
INTRAMUSCULAR | Status: AC
  Filled 2014-07-18: qty 30

## 2014-07-18 MED ORDER — TRAMADOL HCL 50 MG PO TABS: 50.0000 mg | ORAL_TABLET | Freq: Four times a day (QID) | ORAL | Status: DC | PRN

## 2014-07-18 MED ORDER — FENTANYL CITRATE 0.05 MG/ML IJ SOLN
INTRAMUSCULAR | Status: AC
  Filled 2014-07-18: qty 2

## 2014-07-18 MED ORDER — NATALIZUMAB 300 MG/15ML IV CONC: 300.0000 mg | INTRAVENOUS | Status: DC

## 2014-07-18 MED ORDER — ONDANSETRON HCL 4 MG/2ML IJ SOLN: 4.0000 mg | Freq: Four times a day (QID) | INTRAMUSCULAR | Status: DC | PRN

## 2014-07-18 MED ORDER — ASPIRIN EC 81 MG PO TBEC
81.0000 mg | DELAYED_RELEASE_TABLET | Freq: Every day | ORAL | Status: DC
  Filled 2014-07-18: qty 1

## 2014-07-18 MED ORDER — EZETIMIBE 10 MG PO TABS
10.0000 mg | ORAL_TABLET | Freq: Every day | ORAL | Status: DC
  Administered 2014-07-18: 22:00:00 10 mg via ORAL
  Filled 2014-07-18 (×2): qty 1

## 2014-07-18 MED ORDER — AMLODIPINE BESYLATE 5 MG PO TABS
5.0000 mg | ORAL_TABLET | Freq: Every day | ORAL | Status: DC
  Administered 2014-07-18: 22:00:00 5 mg via ORAL
  Filled 2014-07-18 (×2): qty 1

## 2014-07-18 MED ORDER — ASPIRIN 81 MG PO CHEW
CHEWABLE_TABLET | ORAL | Status: AC
  Filled 2014-07-18: qty 1

## 2014-07-18 MED ORDER — LORATADINE 10 MG PO TABS
10.0000 mg | ORAL_TABLET | Freq: Every day | ORAL | Status: DC | PRN
  Filled 2014-07-18: qty 1

## 2014-07-18 MED ORDER — PANTOPRAZOLE SODIUM 40 MG PO TBEC
40.0000 mg | DELAYED_RELEASE_TABLET | Freq: Every day | ORAL | Status: DC
  Administered 2014-07-18: 22:00:00 40 mg via ORAL
  Filled 2014-07-18: qty 1

## 2014-07-18 MED ORDER — SODIUM CHLORIDE 0.9 % IV SOLN: INTRAVENOUS | Status: AC

## 2014-07-18 MED ORDER — BIVALIRUDIN 250 MG IV SOLR
INTRAVENOUS | Status: AC
Start: 2014-07-18 — End: 2014-07-18
  Filled 2014-07-18: qty 250

## 2014-07-18 MED ORDER — NITROGLYCERIN 0.4 MG SL SUBL: 0.4000 mg | SUBLINGUAL_TABLET | SUBLINGUAL | Status: DC | PRN

## 2014-07-18 MED ORDER — HEPARIN (PORCINE) IN NACL 2-0.9 UNIT/ML-% IJ SOLN
INTRAMUSCULAR | Status: AC
  Filled 2014-07-18: qty 1000

## 2014-07-18 MED ORDER — SODIUM CHLORIDE 0.9 % IV SOLN
INTRAVENOUS | Status: DC
  Administered 2014-07-18: 75 mL/h via INTRAVENOUS

## 2014-07-18 MED ORDER — VERAPAMIL HCL 2.5 MG/ML IV SOLN
INTRAVENOUS | Status: AC
  Filled 2014-07-18: qty 2

## 2014-07-18 MED ORDER — LIDOCAINE 5 % EX PTCH
2.0000 | MEDICATED_PATCH | Freq: Every day | CUTANEOUS | Status: DC | PRN
  Filled 2014-07-18: qty 2

## 2014-07-18 MED ORDER — ASPIRIN 81 MG PO CHEW
81.0000 mg | CHEWABLE_TABLET | ORAL | Status: AC
  Administered 2014-07-18: 81 mg via ORAL

## 2014-07-18 MED ORDER — NITROGLYCERIN 1 MG/10 ML FOR IR/CATH LAB
INTRA_ARTERIAL | Status: AC
  Filled 2014-07-18: qty 10

## 2014-07-18 MED ORDER — FLUOXETINE HCL 20 MG PO CAPS
20.0000 mg | ORAL_CAPSULE | Freq: Every day | ORAL | Status: DC
  Administered 2014-07-18: 20 mg via ORAL
  Filled 2014-07-18 (×2): qty 1

## 2014-07-18 MED ORDER — ACETAMINOPHEN 325 MG PO TABS: 650.0000 mg | ORAL_TABLET | ORAL | Status: DC | PRN

## 2014-07-18 MED ORDER — ZOLPIDEM TARTRATE 5 MG PO TABS
5.0000 mg | ORAL_TABLET | Freq: Every evening | ORAL | Status: DC | PRN
  Administered 2014-07-19: 5 mg via ORAL
  Filled 2014-07-18: qty 1

## 2014-07-18 NOTE — Interval H&P Note (Signed)
Cath Lab Visit (complete for each Cath Lab visit)  Clinical Evaluation Leading to the Procedure:   ACS: No.  Non-ACS:    Anginal Classification: CCS III  Anti-ischemic medical therapy: Maximal Therapy (2 or more classes of medications)  Non-Invasive Test Results: No non-invasive testing performed  Prior CABG: No previous CABG      History and Physical Interval Note:  07/18/2014 7:32 AM  Crystal Clark  has presented today for surgery, with the diagnosis of cp  The various methods of treatment have been discussed with the patient and family. After consideration of risks, benefits and other options for treatment, the patient has consented to  Procedure(s): LEFT HEART CATHETERIZATION WITH CORONARY ANGIOGRAM (N/A) as a surgical intervention .  The patient's history has been reviewed, patient examined, no change in status, stable for surgery.  I have reviewed the patient's chart and labs.  Questions were answered to the patient's satisfaction.     Sinclair Grooms

## 2014-07-18 NOTE — H&P (View-Only) (Signed)
Patient ID: Crystal Clark, female   DOB: January 29, 1956, 58 y.o.   MRN: [id number]    07/11/2014 Crystal Clark   11/20/1956  [id number]  Primary Physicia No primary provider on file. Primary Cardiologist: Dr. Percival Spanish  HPI:  Crystal Clark is a 58 y/o female, followed by Dr. Percival Spanish, with a history of multiple sclerosis, hypertension, hyperlipidemia, 30+ year history of ongoing tobacco abuse (smokes one pack per day) and known CAD, s/p PCI to the LAD in 2006 in [place]. She underwent a repeat LHC on 12/23/11 by Dr. Burt Knack in the setting of chest pain. She was found to have a patent LAD stent with only 40% ISR, this was essentially unchanged from prior. Medical therapy was recommended at that time.   She had been doing fairly well, up until 3 weeks ago when she started to develop chest pain symptoms very similar to her prior angina. She notes substernal chest pressure/squeezing sensation with discomfort radiating to her bilateral upper extremities. She states these episodes have felt as if someone was "sitting on my chest". He notes associated dyspnea and nausea. Her symptoms have been relieved with sublingual nitroglycerin almost immediately. Two nights ago, she had an episode that woke her from her sleep, relieved after 2 doses of sublingual nitroglycerin. Her episodes seem to be worsening each time they occur. She also notes significant decrease in exercise tolerance over the last 2-3 weeks. She has lack of energy and gives out very easily performing household chores. She denies dizziness, syncope/near-syncope and no melena. She is currently chest pain-free. Her EKG demonstrates normal sinus rhythm without any ischemic abnormalities.  Unfortunately, she continues to smoke one pack per day. However, she reports daily compliance with her medications including aspirin, Plavix, Lipitor and Ranexa. She has been intolerant to Imdur, due to previous interactions with her medications used to treat her  multiple sclerosis. Beta blocker therapy is contraindicated due to a history of Raynauds disease.  Current Outpatient Prescriptions  Medication Sig Dispense Refill  . amLODipine (NORVASC) 5 MG tablet TAKE 1 TABLET (5 MG TOTAL) BY MOUTH DAILY.  30 tablet  0  . aspirin EC 81 MG tablet Take 81 mg by mouth daily.        Marland Kitchen atorvastatin (LIPITOR) 20 MG tablet TAKE 1 TABLET (20 MG TOTAL) BY MOUTH DAILY.  30 tablet  0  . clopidogrel (PLAVIX) 75 MG tablet TAKE 1 TABLET BY MOUTH EVERY DAY  30 tablet  0  . Cyanocobalamin (VITAMIN B-12 IJ) Inject as directed once a week.      . ezetimibe (ZETIA) 10 MG tablet TAKE 1 TABLET (10 MG TOTAL) BY MOUTH DAILY.  30 tablet  0  . FLUoxetine (PROZAC) 20 MG tablet Take 1 tablet by mouth daily.      Marland Kitchen lidocaine (LIDODERM) 5 % Place 2 patches onto the skin daily as needed. For pain Remove & Discard patch within 12 hours or as directed by MD       . loratadine (CLARITIN) 10 MG tablet Take 1 tablet (10 mg total) by mouth daily.  30 tablet  6  . natalizumab (TYSABRI) 300 MG/15ML injection Inject into the vein every 30 (thirty) days.      . nitroGLYCERIN (NITROSTAT) 0.4 MG SL tablet Place 1 tablet (0.4 mg total) under the tongue every 5 (five) minutes as needed for chest pain.  25 tablet  3  . omeprazole (PRILOSEC) 20 MG capsule TAKE 1 CAPSULE (20 MG TOTAL) BY MOUTH DAILY.  30 capsule  0  . ranolazine (RANEXA) 500 MG 12 hr tablet TAKE 1 TABLET BY MOUTH TWICE A DAY  60 tablet  0  . traMADol (ULTRAM) 50 MG tablet Take 50-100 mg by mouth every 6 (six) hours as needed. For pain       . Vitamin D, Ergocalciferol, (DRISDOL) 50000 UNITS CAPS capsule Take 1 capsule by mouth once a week.      . zolpidem (AMBIEN CR) 6.25 MG CR tablet 6.25 mg. Take 1 to 2 tabs per day at bed time       No current facility-administered medications for this visit.    Allergies  Allergen Reactions  . Simvastatin     "makes MS worse"    History   Social History  . Marital Status: Married     Spouse Name: N/A    Number of Children: N/A  . Years of Education: N/A   Occupational History  . 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 Smoking status: Current Every Day Smoker -- 1.00 packs/day    Types: Cigars, Cigarettes  . Smokeless tobacco: Not on file  . Alcohol Use: No  . Drug Use: No  . Sexual Activity: Not on file   Other Topics Concern  . Not on file   Social History Narrative  . No narrative on file     Review of Systems: General: negative for chills, fever, night sweats or weight changes.  Cardiovascular: negative for chest pain, dyspnea on exertion, edema, orthopnea, palpitations, paroxysmal nocturnal dyspnea or shortness of breath Dermatological: negative for rash Respiratory: negative for cough or wheezing Urologic: negative for hematuria Abdominal: negative for nausea, vomiting, diarrhea, bright red blood per rectum, melena, or hematemesis Neurologic: negative for visual changes, syncope, or dizziness All other systems reviewed and are otherwise negative except as noted above.    Blood pressure 118/80, pulse 63, height 5\' 2"  (1.575 m), weight 104 lb 14.4 oz (47.582 kg).  General appearance: alert, cooperative and no distress Neck: no carotid bruit and no JVD Lungs: clear to auscultation bilaterally Heart: regular rate and rhythm, S1, S2 normal, no murmur, click, rub or gallop Extremities: no LEE Pulses: 2+ and symmetric Skin: warm and dry Neurologic: Grossly normal  EKG NSR 63 bpm. No ischemic changes  ASSESSMENT AND PLAN:   1. Unstable angina: Her symptoms are very similar to her prior anginal symptoms. She notes resting/nocturnal anginal like pain that has been gradually worsening and increasing in frequency over the last 3 weeks and have almost immediately been relieved with sublingual nitroglycerin. She has known CAD with prior stenting of the RCA in 2006. Her last left heart catheterization in 2013 revealed 40%  in-stent restenosis. However, since that time she has continued to smoke chronically, on average one pack per day. Given her recent symptoms, in the setting of continued tobacco abuse, along with her other risk factors including hyperlipidemia and hypertension, I feel that it is best to proceed with a diagnostic left heart catheterization for definitive reassessment of her coronary anatomy. If there is significant progression of disease regarding her in-stent restenosis, she may require intervention. If no significant change, we can further increase her Ranexa to 1000 mg BID. For now, continue aspirin, Plavix, Lipitor and Ranexa. We will avoid beta blocker therapy due to a history of Raynauds disease, as well as long-acting oral nitrates, as she has had prior interactions with her medications used to treat her multiple  sclerosis.  2. Hyperlipidemia: Her last lipid panel was over a year ago in June of 2014. This demonstrated significant improvement in LDL from 152 mg/dL down to 81 mg/dL. However, her triglycerides increased from 71 to 125. She has not eaten since midnight. Will obtain a fasting lipid panel today. For now continue current dose of Lipitor and Zetia.  3. Tobacco abuse: 30+ year history, smoking on average one pack per day.  Smoking cessation was strongly advised.  4. Hypertension: Diet controlled. Blood pressure in clinic today as a 110/80.   PLAN   Will plan for diagnostic outpatient left heart catheterization as outlined in detail above. For now, continue current medications as prescribed. We'll obtain a fasting lipid panel to reassess cholesterol levels. We'll also order a CBC and BMET for baseline labs prior to undergoing repeat catheterization. She has an adequate amount of sublingual nitroglycerin tablets at home.   SIMMONS, BRITTAINYPA-C 07/11/2014 10:49 AM

## 2014-07-18 NOTE — CV Procedure (Signed)
Left Heart Catheterization with Coronary Angiography and PCI Report  Crystal Clark  58 y.o.  female December 03, 1956  Procedure Date: 07/18/2014 Referring Physician: Marijo File, MD Primary Cardiologist: Same  INDICATIONS: Class III angina with occasional episodes of rest pain responsive to nitroglycerin. Prior history of LAD stent (Cypher) 2006.  PROCEDURE: 1. Left heart cath; 2. Coronary angiography; 3. Left ventriculography; 4. Cutting balloon angioplasty followed by a DES implantation for ISR mid LAD  CONSENT:  The risks, benefits, and details of the procedure were explained in detail to the patient. Risks including death, stroke, heart attack, kidney injury, allergy, limb ischemia, bleeding and radiation injury were discussed.  The patient verbalized understanding and wanted to proceed.  Informed written consent was obtained.  PROCEDURE TECHNIQUE:  After Xylocaine anesthesia a 5 French Slender sheath was placed in the right radial artery with an angiocath and the modified Seldinger technique.  Coronary angiography was done using a 5 F JR 4 and JL 3.5 cm diagnostic catheters.  Left ventriculography was done using the JR 4 catheter and hand injection.   Angiographic views of the LAD stent were reviewed. A comparison was made with the angiogram performed in 2013 by Dr. Fletcher Anon. The patient has tandem distal LAD stent ISR with up to 80% stenosis in the more proximal lesion depending upon review chosen. This represents a significant progression compared to 2013. Given the patient's symptoms the decision was made to proceed with angioplasty and stenting of the ISR.  Additional 150 mg of Plavix was administered orally. The patient has been on continuous Plavix therapy since the stent was implanted in years ago. A bivalirudin bolus and infusion was started and the ACT documented to be therapeutic (greater than 300). We then used a BMW wire easily crossed the region of stenosis and LAD stent. An  XB LAD 3.0 guide catheter gave her good support. Predilatation was then performed with a 10 mm x 2.5 Angiosculpt balloon x2 inflations. Total balloon time 118 seconds. She did not experience chest pain but had nausea with occlusion. We then positioned and deployed a Promus Premier 2.75 x 16 mm DES at 11 atmospheres. The postdilatation was performed with a 3.0 x 12 mm [place] Trek to 14 atmospheres x2 inflations. Final angiographic result was acceptable.   CONTRAST:  Total of 190 cc.  COMPLICATIONS:  None   HEMODYNAMICS:  Aortic pressure 108/57 mmHg; LV pressure 111/7 mmHg; LVEDP 10 mm mercury  ANGIOGRAPHIC DATA:   The left main coronary artery is normal.  The left anterior descending artery is patent but contains a mid vessel stent that has tandem lesions distally representing ISR. The percent obstruction is 70 and 70. The more distal lesion represents a new lesion compared to 2013. The more proximal lesion has progressed. No other regions of significant stenosis are noted.  The left circumflex artery is large and widely patent. He gives origin to one dominant obtuse marginal.  The right coronary artery is dominant and widely patent. The PDA wraps around the left ventricular apex.  PCI RESULTS: Tandem 70% stenoses producing in-stent restenosis in the distal margin of the Cypher stent. After angina sculpt scoring balloon angioplasty and stenting 0% stenosis is noted. TIMI grade 3 flow was noted post procedure., Post dilatation balloon size is 3.0 mm.  LEFT VENTRICULOGRAM:  Left ventricular angiogram was done in the 30 RAO projection and revealed normal cavity size with EF of 65-70%   IMPRESSIONS:  1. Significant in-stent restenosis with tandem 70%  lesions. 2. Successful scoring balloon angioplasty and stenting of the ISR with reduction in stenosis to 0% using drug-eluting stent. 3. Widely patent circumflex and RCA 4. Normal LV function with EF of 70%   RECOMMENDATION:  Continue aspirin and  Plavix Hopeful discharge in a.m. if no complications.

## 2014-07-18 NOTE — Care Management Note (Addendum)
  Page 1 of 1   07/18/2014     11:46:56 AM CARE MANAGEMENT NOTE 07/18/2014  Patient:  Crystal Clark, Crystal Clark   Account Number:  [id number]  Date Initiated:  07/18/2014  Documentation initiated by:  Briceyda Abdullah  Subjective/Objective Assessment:   unspecified angina pectoris     Action/Plan:   CM to follow for disposition needs   Anticipated DC Date:  07/19/2014   Anticipated DC Plan:  HOME/SELF CARE         Choice offered to / List presented to:             Status of service:  Completed, signed off Medicare Important Message given?   (If response is "NO", the following Medicare IM given date fields will be blank) Date Medicare IM given:   Medicare IM given by:   Date Additional Medicare IM given:   Additional Medicare IM given by:    Discharge Disposition:  HOME/SELF CARE  Per UR Regulation:    If discussed at Long Length of Stay Meetings, dates discussed:    Comments:  Terriyah Westra RN, BSN, MSHL, CCM  Nurse - Case Manager, (Unit [phone]  07/18/2014 Med review:  Plavix Dispostion Plan:  home / self care

## 2014-07-18 NOTE — Progress Notes (Signed)
TR BAND REMOVAL  LOCATION:    right radial  DEFLATED PER PROTOCOL:    Yes.    TIME BAND OFF / DRESSING APPLIED:    1415   SITE UPON ARRIVAL:    Level 0  SITE AFTER BAND REMOVAL:    Level 0  CIRCULATION SENSATION AND MOVEMENT:    Within Normal Limits   Yes.    COMMENTS:  Site rechecked at 1445 with no change in assessment. Dressing dry and intact , no bleeding or hematoma, CSMs wnls and ulnar and radial pulses +2.

## 2014-07-19 ENCOUNTER — Encounter (HOSPITAL_COMMUNITY): Payer: Self-pay | Admitting: Physician Assistant

## 2014-07-19 DIAGNOSIS — I10 Essential (primary) hypertension: Secondary | ICD-10-CM

## 2014-07-19 DIAGNOSIS — I2510 Atherosclerotic heart disease of native coronary artery without angina pectoris: Secondary | ICD-10-CM | POA: Diagnosis not present

## 2014-07-19 DIAGNOSIS — E785 Hyperlipidemia, unspecified: Secondary | ICD-10-CM

## 2014-07-19 DIAGNOSIS — I209 Angina pectoris, unspecified: Secondary | ICD-10-CM

## 2014-07-19 DIAGNOSIS — I208 Other forms of angina pectoris: Secondary | ICD-10-CM

## 2014-07-19 LAB — CBC
HCT: 32.9 % — ABNORMAL LOW (ref 36.0–46.0)
Hemoglobin: 11.1 g/dL — ABNORMAL LOW (ref 12.0–15.0)
MCH: 30.8 pg (ref 26.0–34.0)
MCHC: 33.7 g/dL (ref 30.0–36.0)
MCV: 91.4 fL (ref 78.0–100.0)
Platelets: 274 10*3/uL (ref 150–400)
RBC: 3.6 MIL/uL — ABNORMAL LOW (ref 3.87–5.11)
RDW: 13.9 % (ref 11.5–15.5)
WBC: 6.9 10*3/uL (ref 4.0–10.5)

## 2014-07-19 LAB — BASIC METABOLIC PANEL
Anion gap: 10 (ref 5–15)
BUN: 7 mg/dL (ref 6–23)
CO2: 27 mEq/L (ref 19–32)
Calcium: 8.7 mg/dL (ref 8.4–10.5)
Chloride: 104 mEq/L (ref 96–112)
Creatinine, Ser: 0.75 mg/dL (ref 0.50–1.10)
GFR calc Af Amer: >90 mL/min (ref >90)
GFR calc non Af Amer: >90 mL/min (ref >90)
Glucose, Bld: 82 mg/dL (ref 70–99)
Potassium: 4.7 mEq/L (ref 3.7–5.3)
Sodium: 141 mEq/L (ref 137–147)

## 2014-07-19 MED FILL — Sodium Chloride IV Soln 0.9%: INTRAVENOUS | Qty: 50 | Status: AC

## 2014-07-19 NOTE — Discharge Summary (Signed)
Physician Discharge Summary     Cardiologist: Hochrein  Patient ID: PANAGIOTA PERFETTI MRN: [id number] DOB/AGE: October 14, 1956 58 y.o.  Admit date: 07/18/2014 Discharge date: 07/19/2014  Admission Diagnoses:    Other and unspecified angina pectoris  Discharge Diagnoses:  Active Problems:   Other and unspecified angina pectoris   HLD   Tobacco abuse   HTN  Discharged Condition: stable  Hospital Course:   Mrs. Kucinski is a 58 y/o female, followed by Dr. Percival Spanish, with a history of multiple sclerosis, hypertension, hyperlipidemia, 30+ year history of ongoing tobacco abuse (smokes one pack per day) and known CAD, s/p PCI to the LAD in 2006 in [place]. She underwent a repeat LHC on 12/23/11 by Dr. Burt Knack in the setting of chest pain. She was found to have a patent LAD stent with only 40% ISR, this was essentially unchanged from prior. Medical therapy was recommended at that time.   She had been doing fairly well, up until 3 weeks ago when she started to develop chest pain symptoms very similar to her prior angina. She notes substernal chest pressure/squeezing sensation with discomfort radiating to her bilateral upper extremities. She states these episodes have felt as if someone was "sitting on my chest". He notes associated dyspnea and nausea. Her symptoms have been relieved with sublingual nitroglycerin almost immediately. Two nights ago, she had an episode that woke her from her sleep, relieved after 2 doses of sublingual nitroglycerin. Her episodes seem to be worsening each time they occur. She also notes significant decrease in exercise tolerance over the last 2-3 weeks. She has lack of energy and gives out very easily performing household chores. She denies dizziness, syncope/near-syncope and no melena. She is currently chest pain-free. Her EKG demonstrates normal sinus rhythm without any ischemic abnormalities.  Unfortunately, she continues to smoke one pack per day. However, she reports daily  compliance with her medications including aspirin, Plavix, Lipitor and Ranexa. She has been intolerant to Imdur, due to previous interactions with her medications used to treat her multiple sclerosis. Beta blocker therapy is contraindicated due to a history of Raynauds disease.  She was brought in for cardiac catheterization which revealed significant in-stent restenosis with tandem 70% lesions.  She underwent successful scoring balloon angioplasty and stenting of the ISR with reduction in stenosis to 0% using drug-eluting stent.  Widely patent circumflex and RCA.  Normal LV function with EF of 70%.  Continue ASA and plavix.  The patient was seen by Dr. Irish Lack who felt she was stable for DC home.     Consults: None  Significant Diagnostic Studies: Left Heart Catheterization with Coronary Angiography and PCI Report  CHIZARA MENA  58 y.o.  female  13-Mar-1956  Procedure Date: 07/18/2014  Referring Physician: Marijo File, MD  Primary Cardiologist: Same  INDICATIONS: Class III angina with occasional episodes of rest pain responsive to nitroglycerin. Prior history of LAD stent (Cypher) 2006.  PROCEDURE: 1. Left heart cath; 2. Coronary angiography; 3. Left ventriculography; 4. Cutting balloon angioplasty followed by a DES implantation for ISR mid LAD  CONSENT:  The risks, benefits, and details of the procedure were explained in detail to the patient. Risks including death, stroke, heart attack, kidney injury, allergy, limb ischemia, bleeding and radiation injury were discussed. The patient verbalized understanding and wanted to proceed. Informed written consent was obtained.  PROCEDURE TECHNIQUE: After Xylocaine anesthesia a 5 French Slender sheath was placed in the right radial artery with an angiocath and the modified Seldinger technique.  Coronary angiography was done using a 5 F JR 4 and JL 3.5 cm diagnostic catheters. Left ventriculography was done using the JR 4 catheter and hand injection.    Angiographic views of the LAD stent were reviewed. A comparison was made with the angiogram performed in 2013 by Dr. Fletcher Anon. The patient has tandem distal LAD stent ISR with up to 80% stenosis in the more proximal lesion depending upon review chosen. This represents a significant progression compared to 2013. Given the patient's symptoms the decision was made to proceed with angioplasty and stenting of the ISR.  Additional 150 mg of Plavix was administered orally. The patient has been on continuous Plavix therapy since the stent was implanted in years ago. A bivalirudin bolus and infusion was started and the ACT documented to be therapeutic (greater than 300). We then used a BMW wire easily crossed the region of stenosis and LAD stent. An XB LAD 3.0 guide catheter gave her good support. Predilatation was then performed with a 10 mm x 2.5 Angiosculpt balloon x2 inflations. Total balloon time 118 seconds. She did not experience chest pain but had nausea with occlusion. We then positioned and deployed a Promus Premier 2.75 x 16 mm DES at 11 atmospheres. The postdilatation was performed with a 3.0 x 12 mm Zalma Trek to 14 atmospheres x2 inflations. Final angiographic result was acceptable.  CONTRAST: Total of 190 cc.  COMPLICATIONS: None  HEMODYNAMICS: Aortic pressure 108/57 mmHg; LV pressure 111/7 mmHg; LVEDP 10 mm mercury  ANGIOGRAPHIC DATA: The left main coronary artery is normal.  The left anterior descending artery is patent but contains a mid vessel stent that has tandem lesions distally representing ISR. The percent obstruction is 70 and 70. The more distal lesion represents a new lesion compared to 2013. The more proximal lesion has progressed. No other regions of significant stenosis are noted.  The left circumflex artery is large and widely patent. He gives origin to one dominant obtuse marginal.  The right coronary artery is dominant and widely patent. The PDA wraps around the left ventricular apex.   PCI RESULTS: Tandem 70% stenoses producing in-stent restenosis in the distal margin of the Cypher stent. After angina sculpt scoring balloon angioplasty and stenting 0% stenosis is noted. TIMI grade 3 flow was noted post procedure., Post dilatation balloon size is 3.0 mm.  LEFT VENTRICULOGRAM: Left ventricular angiogram was done in the 30 RAO projection and revealed normal cavity size with EF of 65-70%  IMPRESSIONS: 1. Significant in-stent restenosis with tandem 70% lesions.  2. Successful scoring balloon angioplasty and stenting of the ISR with reduction in stenosis to 0% using drug-eluting stent.  3. Widely patent circumflex and RCA  4. Normal LV function with EF of 70%  RECOMMENDATION: Continue aspirin and Plavix  Hopeful discharge in a.m. if no complications.   Treatments: See above  Discharge Exam: Blood pressure 87/53, pulse 63, temperature 97.6 F (36.4 C), temperature source Oral, resp. rate 20, height 5\' 2"  (1.575 m), weight 101 lb 6.6 oz (46 kg), SpO2 97.00%.   Disposition: 01-Home or Self Care      Discharge Instructions   Diet - low sodium heart healthy    Complete by:  As directed      Discharge instructions    Complete by:  As directed   No lifting with your right arm for three days.     Increase activity slowly    Complete by:  As directed  Medication List         amLODipine 5 MG tablet  Commonly known as:  NORVASC  Take 5 mg by mouth daily.     aspirin EC 81 MG tablet  Take 81 mg by mouth daily.     atorvastatin 20 MG tablet  Commonly known as:  LIPITOR  Take 20 mg by mouth daily.     clopidogrel 75 MG tablet  Commonly known as:  PLAVIX  Take 75 mg by mouth daily.     ezetimibe 10 MG tablet  Commonly known as:  ZETIA  Take 10 mg by mouth daily.     FLUoxetine 20 MG tablet  Commonly known as:  PROZAC  Take 20 mg by mouth daily.     lidocaine 5 %  Commonly known as:  LIDODERM  Place 2 patches onto the skin daily as needed. For  pain Remove & Discard patch within 12 hours or as directed by MD     loratadine 10 MG tablet  Commonly known as:  CLARITIN  Take 10 mg by mouth daily as needed for allergies.     nitroGLYCERIN 0.4 MG SL tablet  Commonly known as:  NITROSTAT  Place 1 tablet (0.4 mg total) under the tongue every 5 (five) minutes as needed for chest pain.     omeprazole 20 MG capsule  Commonly known as:  PRILOSEC  Take 20 mg by mouth daily.     ranolazine 500 MG 12 hr tablet  Commonly known as:  RANEXA  Take 500 mg by mouth 2 (two) times daily.     traMADol 50 MG tablet  Commonly known as:  ULTRAM  Take 50 mg by mouth every 6 (six) hours as needed for moderate pain.     TYSABRI 300 MG/15ML injection  Generic drug:  natalizumab  Inject 300 mg into the vein every 30 (thirty) days.     VITAMIN B-12 IJ  Inject as directed once a week.     Vitamin D (Ergocalciferol) 50000 UNITS Caps capsule  Commonly known as:  DRISDOL  Take 50,000 Units by mouth once a week.     zolpidem 6.25 MG CR tablet  Commonly known as:  AMBIEN CR  Take 6.25 mg by mouth at bedtime.       Follow-up Information   Follow up with HAGER, BRYAN, PA-C On 08/10/2014. (10:30 AM)    Specialty:  Physician Assistant   Contact information:   [address] [place] [address] [phone]       Signed: Tarri Fuller 07/19/2014, 9:39 AM  I have examined the patient and reviewed assessment and plan and discussed with patient.  Agree with above as stated.  Exam: RRR S1 S2 CTA bilaterally 2+ right radial pulse No edema  Continue DAPT.   Regina Coppolino S.

## 2014-07-19 NOTE — Progress Notes (Signed)
CARDIAC REHAB PHASE I   PRE:  Rate/Rhythm: 61 SR    BP: sitting 127/58    SaO2:   MODE:  Ambulation: 1000 ft   POST:  Rate/Rhythm: 76 SR    BP: sitting 133/59     SaO2:   Pt denied CP. Had to rest after 900 ft due to fatigue. Sts this is probably partially due to MS and partially due to deconditioning recently dealing with angina. Ed completed with long discussion of smoking cessation. Pt is wanting to try nicotine patches and possibly combined with yoga/stress relief. Husband is trying to quit smoking also (although using e-cigarette). Good reception. Unable to do CRPII due to lack of driving.  [phone]   Josephina Shih [place] CES, ACSM 07/19/2014 8:54 AM

## 2014-07-24 ENCOUNTER — Encounter (HOSPITAL_COMMUNITY): Payer: Self-pay | Admitting: Emergency Medicine

## 2014-07-24 ENCOUNTER — Emergency Department (HOSPITAL_COMMUNITY): Payer: Managed Care, Other (non HMO)

## 2014-07-24 ENCOUNTER — Emergency Department (HOSPITAL_COMMUNITY)
Admission: EM | Admit: 2014-07-24 | Discharge: 2014-07-24 | Disposition: A | Discharge status: Home / Self Care | Payer: Managed Care, Other (non HMO) | Attending: Emergency Medicine | Admitting: Emergency Medicine

## 2014-07-24 DIAGNOSIS — I2510 Atherosclerotic heart disease of native coronary artery without angina pectoris: Secondary | ICD-10-CM

## 2014-07-24 DIAGNOSIS — G35 Multiple sclerosis: Secondary | ICD-10-CM | POA: Insufficient documentation

## 2014-07-24 DIAGNOSIS — E785 Hyperlipidemia, unspecified: Secondary | ICD-10-CM

## 2014-07-24 DIAGNOSIS — Z862 Personal history of diseases of the blood and blood-forming organs and certain disorders involving the immune mechanism: Secondary | ICD-10-CM | POA: Diagnosis not present

## 2014-07-24 DIAGNOSIS — K219 Gastro-esophageal reflux disease without esophagitis: Secondary | ICD-10-CM | POA: Diagnosis not present

## 2014-07-24 DIAGNOSIS — Z79899 Other long term (current) drug therapy: Secondary | ICD-10-CM | POA: Diagnosis not present

## 2014-07-24 DIAGNOSIS — I10 Essential (primary) hypertension: Secondary | ICD-10-CM

## 2014-07-24 DIAGNOSIS — Z7982 Long term (current) use of aspirin: Secondary | ICD-10-CM | POA: Insufficient documentation

## 2014-07-24 DIAGNOSIS — R110 Nausea: Secondary | ICD-10-CM | POA: Insufficient documentation

## 2014-07-24 DIAGNOSIS — F17200 Nicotine dependence, unspecified, uncomplicated: Secondary | ICD-10-CM | POA: Insufficient documentation

## 2014-07-24 DIAGNOSIS — Z9861 Coronary angioplasty status: Secondary | ICD-10-CM | POA: Diagnosis not present

## 2014-07-24 DIAGNOSIS — G8929 Other chronic pain: Secondary | ICD-10-CM | POA: Diagnosis not present

## 2014-07-24 DIAGNOSIS — R0789 Other chest pain: Secondary | ICD-10-CM

## 2014-07-24 DIAGNOSIS — R079 Chest pain, unspecified: Secondary | ICD-10-CM | POA: Diagnosis present

## 2014-07-24 DIAGNOSIS — I2583 Coronary atherosclerosis due to lipid rich plaque: Secondary | ICD-10-CM | POA: Insufficient documentation

## 2014-07-24 HISTORY — DX: Tobacco use: Z72.0

## 2014-07-24 LAB — BASIC METABOLIC PANEL
Anion gap: 13 (ref 5–15)
BUN: 5 mg/dL — ABNORMAL LOW (ref 6–23)
CO2: 24 mEq/L (ref 19–32)
Calcium: 8.9 mg/dL (ref 8.4–10.5)
Chloride: 96 mEq/L (ref 96–112)
Creatinine, Ser: 0.58 mg/dL (ref 0.50–1.10)
GFR calc Af Amer: >90 mL/min (ref >90)
GFR calc non Af Amer: >90 mL/min (ref >90)
Glucose, Bld: 94 mg/dL (ref 70–99)
Potassium: 3.8 mEq/L (ref 3.7–5.3)
Sodium: 133 mEq/L — ABNORMAL LOW (ref 137–147)

## 2014-07-24 LAB — CBC
HCT: 36.7 % (ref 36.0–46.0)
Hemoglobin: 12.7 g/dL (ref 12.0–15.0)
MCH: 31.1 pg (ref 26.0–34.0)
MCHC: 34.6 g/dL (ref 30.0–36.0)
MCV: 89.7 fL (ref 78.0–100.0)
Platelets: 265 10*3/uL (ref 150–400)
RBC: 4.09 MIL/uL (ref 3.87–5.11)
RDW: 13.3 % (ref 11.5–15.5)
WBC: 8 10*3/uL (ref 4.0–10.5)

## 2014-07-24 LAB — I-STAT TROPONIN, ED
Troponin i, poc: 0 ng/mL (ref 0.00–0.08)
Troponin i, poc: 0 ng/mL (ref 0.00–0.08)

## 2014-07-24 LAB — TROPONIN I: Troponin I: <0.3 ng/mL (ref <0.30)

## 2014-07-24 MED ORDER — ONDANSETRON HCL 4 MG/2ML IJ SOLN
4.0000 mg | Freq: Once | INTRAMUSCULAR | Status: AC
  Administered 2014-07-24: 4 mg via INTRAVENOUS
  Filled 2014-07-24: qty 2

## 2014-07-24 MED ORDER — MORPHINE SULFATE 4 MG/ML IJ SOLN
4.0000 mg | Freq: Once | INTRAMUSCULAR | Status: AC
  Administered 2014-07-24: 4 mg via INTRAVENOUS
  Filled 2014-07-24: qty 1

## 2014-07-24 NOTE — Discharge Instructions (Signed)

## 2014-07-24 NOTE — Consult Note (Signed)
Cardiology Consultation Note  Patient ID: Crystal Clark, MRN: [id number], DOB/AGE: 13-Nov-1956 58 y.o. Admit date: 07/24/2014   Date of Consult: 07/24/2014 Primary Physician: No PCP Per Patient Primary Cardiologist: Hochrein  Chief Complaint: Chest pain and left arm numbness x this morning Reason for Consult: Chest pain  HPI:  58 year old female recently admitted to [facility] 8/5-8/7 for angina pectoris with history of multiple sclerosis, HTN, hyperlipidemia, ongoing tobacco abuse (30+ years), and known CAD, s/p PCI to LAD 2006 in [place], [place], repeat cath 12/23/2011 by Dr. Burt Knack in the setting of chest pain revealed patent LAD stent with only 40% ISR, this was essentially unchanged from prior. Medical therapy was recommended at that time.  She had been doing fairly well, up until 4 weeks ago when she started to develop chest pain symptoms very similar to her prior angina. She noted substernal chest pressure/squeezing sensation with discomfort radiating to her bilateral upper arms. Associated dyspnea and nausea. Felt as if someone was sitting on her chest. Her pain was relieved with 2 doses of sublingual nitro. Her chest pain episodes continued to worsen, thus prompting her to come to the hospital at the beginning of August.   She underwent another cardiac cath on 07/18/2014 which revealed tandem distal LAD stent ISR with up to 80% stenosis in the more proximal lesion depending upon review chosen. This represents a significant progression compared to 2013. Given her presentation the decision was made to proceed with angioplasty and stenting of the ISR. She received a Promus Premier 2.75 x 16 mm DES. Widely patent LCx and RCA. Normal LV function with EF of 70%. She was discharged home on aspirin nad Plavix.   Today she comes into the ED stating she has substernal chest pressure that radiates to the the arm with a strong sensation of numbness and weakness to the point that she feels like she  cannot move her arm. Her pain began this morning while she was up moving around feeding and dogs and horses. She has not felt well since her discharge on 8/7, complaining of a general "blah" feeling as well as nausea. States this is not her. She denies any associated diaphoresis, vomiting, SOB, presyncope, or syncope. No palpitations. No edema.   She is now approximately 4 weeks behind schedule on her MS treatments and will not be having them done for another several weeks per her report. She states the above sensation is different feeling than her MS symptoms. She denies any recent falls, last fell approximately 6 weeks ago and did not hit her head. States she never hits her head. She denies any slurred speech or lower extremity weakness.    Of note, she is contraindicated 2/2 raynaud's and is intolerant to Imdur 2/2 interactions with her MS medications.      Past Medical History  Diagnosis Date  . Coronary artery disease     s/p stent to LAD in 2006, 40% in stent restenosis 12/23/11  . Angina at rest   . MS (multiple sclerosis)     since 1997  . Dyslipidemia   . Anemia   . History of blood transfusion 1959  . GERD (gastroesophageal reflux disease)   . Chronic mid back pain     "T7-8 herniated disc that cannot be repaired"  . Raynaud's disease dx'd 2005      Most Recent Cardiac Studies: Left Heart Catheterization with Coronary Angiography and PCI Report  Crystal Clark  58 y.o.  female  [birth year]  Procedure Date: 07/18/2014  Referring Physician: Marijo File, MD  Primary Cardiologist: Same  INDICATIONS: Class III angina with occasional episodes of rest pain responsive to nitroglycerin. Prior history of LAD stent (Cypher) 2006.  PROCEDURE: 1. Left heart cath; 2. Coronary angiography; 3. Left ventriculography; 4. Cutting balloon angioplasty followed by a DES implantation for ISR mid LAD  CONSENT:  The risks, benefits, and details of the procedure were explained in detail to the  patient. Risks including death, stroke, heart attack, kidney injury, allergy, limb ischemia, bleeding and radiation injury were discussed. The patient verbalized understanding and wanted to proceed. Informed written consent was obtained.  PROCEDURE TECHNIQUE: After Xylocaine anesthesia a 5 French Slender sheath was placed in the right radial artery with an angiocath and the modified Seldinger technique. Coronary angiography was done using a 5 F JR 4 and JL 3.5 cm diagnostic catheters. Left ventriculography was done using the JR 4 catheter and hand injection.  Angiographic views of the LAD stent were reviewed. A comparison was made with the angiogram performed in 2013 by Dr. Fletcher Anon. The patient has tandem distal LAD stent ISR with up to 80% stenosis in the more proximal lesion depending upon review chosen. This represents a significant progression compared to 2013. Given the patient's symptoms the decision was made to proceed with angioplasty and stenting of the ISR.  Additional 150 mg of Plavix was administered orally. The patient has been on continuous Plavix therapy since the stent was implanted in years ago. A bivalirudin bolus and infusion was started and the ACT documented to be therapeutic (greater than 300). We then used a BMW wire easily crossed the region of stenosis and LAD stent. An XB LAD 3.0 guide catheter gave her good support. Predilatation was then performed with a 10 mm x 2.5 Angiosculpt balloon x2 inflations. Total balloon time 118 seconds. She did not experience chest pain but had nausea with occlusion. We then positioned and deployed a Promus Premier 2.75 x 16 mm DES at 11 atmospheres. The postdilatation was performed with a 3.0 x 12 mm [place] to 14 atmospheres x2 inflations. Final angiographic result was acceptable.  CONTRAST: Total of 190 cc.  COMPLICATIONS: None  HEMODYNAMICS: Aortic pressure 108/57 mmHg; LV pressure 111/7 mmHg; LVEDP 10 mm mercury  ANGIOGRAPHIC DATA: The left main  coronary artery is normal.  The left anterior descending artery is patent but contains a mid vessel stent that has tandem lesions distally representing ISR. The percent obstruction is 70 and 70. The more distal lesion represents a new lesion compared to 2013. The more proximal lesion has progressed. No other regions of significant stenosis are noted.  The left circumflex artery is large and widely patent. He gives origin to one dominant obtuse marginal.  The right coronary artery is dominant and widely patent. The PDA wraps around the left ventricular apex.  PCI RESULTS: Tandem 70% stenoses producing in-stent restenosis in the distal margin of the Cypher stent. After angina sculpt scoring balloon angioplasty and stenting 0% stenosis is noted. TIMI grade 3 flow was noted post procedure., Post dilatation balloon size is 3.0 mm.  LEFT VENTRICULOGRAM: Left ventricular angiogram was done in the 30 RAO projection and revealed normal cavity size with EF of 65-70%  IMPRESSIONS: 1. Significant in-stent restenosis with tandem 70% lesions.  2. Successful scoring balloon angioplasty and stenting of the ISR with reduction in stenosis to 0% using drug-eluting stent.  3. Widely patent circumflex and RCA  4. Normal LV  function with EF of 70%  RECOMMENDATION: Continue aspirin and Plavix  Hopeful discharge in a.m. if no complications.      Surgical History:  Past Surgical History  Procedure Laterality Date  . Cesarean section  X 2  . Knee arthroscopy Left   . Coronary angioplasty with stent placement  03/2005  . Cardiac catheterization  12/2011  . Coronary angioplasty with stent placement  07/18/2014    ISRS- Mid LAD: Promus Premier 2.75 x 16 mm DES      Home Meds: Prior to Admission medications   Medication Sig Start Date End Date Taking? Authorizing Provider  amLODipine (NORVASC) 5 MG tablet Take 5 mg by mouth daily.    Historical Provider, MD  aspirin EC 81 MG tablet Take 81 mg by mouth daily.       Historical Provider, MD  atorvastatin (LIPITOR) 20 MG tablet Take 20 mg by mouth daily.    Historical Provider, MD  clopidogrel (PLAVIX) 75 MG tablet Take 75 mg by mouth daily.    Historical Provider, MD  Cyanocobalamin (VITAMIN B-12 IJ) Inject as directed once a week.    Historical Provider, MD  ezetimibe (ZETIA) 10 MG tablet Take 10 mg by mouth daily.    Historical Provider, MD  FLUoxetine (PROZAC) 20 MG tablet Take 20 mg by mouth daily.  05/13/14   Historical Provider, MD  lidocaine (LIDODERM) 5 % Place 2 patches onto the skin daily as needed. For pain Remove & Discard patch within 12 hours or as directed by MD     Historical Provider, MD  loratadine (CLARITIN) 10 MG tablet Take 10 mg by mouth daily as needed for allergies.    Historical Provider, MD  natalizumab (TYSABRI) 300 MG/15ML injection Inject 300 mg into the vein every 30 (thirty) days.     Historical Provider, MD  nitroGLYCERIN (NITROSTAT) 0.4 MG SL tablet Place 1 tablet (0.4 mg total) under the tongue every 5 (five) minutes as needed for chest pain. 12/24/11   Roger A Arguello, PA-C  omeprazole (PRILOSEC) 20 MG capsule Take 20 mg by mouth daily.    Historical Provider, MD  ranolazine (RANEXA) 500 MG 12 hr tablet Take 500 mg by mouth 2 (two) times daily.    Historical Provider, MD  traMADol (ULTRAM) 50 MG tablet Take 50 mg by mouth every 6 (six) hours as needed for moderate pain.     Historical Provider, MD  Vitamin D, Ergocalciferol, (DRISDOL) 50000 UNITS CAPS capsule Take 50,000 Units by mouth once a week.  05/13/14   Historical Provider, MD  zolpidem (AMBIEN CR) 6.25 MG CR tablet Take 6.25 mg by mouth at bedtime.     Historical Provider, MD    Inpatient Medications:     Allergies:  Allergies  Allergen Reactions  . Simvastatin Other (See Comments)    "makes MS worse"    History   Social History  . Marital Status: Married    Spouse Name: N/A    Number of Children: N/A  . Years of Education: N/A   Occupational History    . 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 Smoking status: Current Every Day Smoker -- 0.50 packs/day for 32 years    Types: Cigars, Cigarettes  . Smokeless tobacco: Never Used  . Alcohol Use: No  . Drug Use: No  . Sexual Activity: Not on file   Other Topics Concern  . Not on file   Social History  Narrative  . No narrative on file     Family History  Problem Relation Age of Onset  . Colon polyps Mother   . Alzheimer's disease Mother   . Hypertension Mother   . Coronary artery disease Father     s/p CABG at 58     Review of Systems: General: negative for chills, fever, night sweats or weight changes.  Cardiovascular: negative for edema, orthopnea, palpitations, paroxysmal nocturnal dyspnea, shortness of breath or dyspnea on exertion Dermatological: negative for rash Respiratory: negative for cough or wheezing Urologic: negative for hematuria Abdominal: negative for vomiting, diarrhea, bright red blood per rectum, melena, or hematemesis Neurologic: negative for visual changes, syncope, or dizziness All other systems reviewed and are otherwise negative except as noted above.  Labs: No results found for this basename: CKTOTAL, CKMB, TROPONINI,  in the last 72 hours Lab Results  Component Value Date   WBC 8.0 07/24/2014   HGB 12.7 07/24/2014   HCT 36.7 07/24/2014   MCV 89.7 07/24/2014   PLT 265 07/24/2014    Recent Labs Lab 07/24/14 1323  NA 133*  K 3.8  CL 96  CO2 24  BUN 5*  CREATININE 0.58  CALCIUM 8.9  GLUCOSE 94   Lab Results  Component Value Date   CHOL 206* 07/11/2014   HDL 88 07/11/2014   LDLCALC 92 07/11/2014   TRIG 129 07/11/2014   No results found for this basename: DDIMER    Radiology/Studies:  Dg Chest 2 View  07/11/2014   CLINICAL DATA:  Chest pain, shortness of breath.  EXAM: CHEST  2 VIEW  COMPARISON:  December 22, 2011.  FINDINGS: The heart size and mediastinal contours are within normal limits. Both lungs are  clear. No pneumothorax or pleural effusion is noted. The visualized skeletal structures are unremarkable.  IMPRESSION: No acute cardiopulmonary abnormality seen.   Electronically Signed   By: Sabino Dick M.D.   On: 07/11/2014 14:26   Dg Chest Port 1 View  07/24/2014   CLINICAL DATA:  Chest pain  EXAM: PORTABLE CHEST - 1 VIEW  COMPARISON:  07/11/2014  FINDINGS: Cardiomediastinal silhouette is stable. No acute infiltrate or pleural effusion. No pulmonary edema. Bony thorax is unremarkable.  IMPRESSION: No active disease.   Electronically Signed   By: Lahoma Clark M.D.   On: 07/24/2014 13:45    EKG: NSR, 77, no acute st/t changes  Physical Exam: Blood pressure 128/75, pulse 70, temperature 98.1 F (36.7 C), temperature source Oral, resp. rate 11, SpO2 100.00%. General: Well developed, well nourished, in no acute distress. Head: Normocephalic, atraumatic, sclera non-icteric, no xanthomas, nares are without discharge.  Neck: Negative for carotid bruits. JVD not elevated. Lungs: Clear bilaterally to auscultation without rales, or rhonchi. Breathing is unlabored. Mild expiratory wheezes left sided.  Heart: RRR with S1 S2. No murmurs, rubs, or gallops appreciated. Abdomen: Soft, non-tender, non-distended with normoactive bowel sounds. No hepatomegaly. No rebound/guarding. No obvious abdominal masses. Msk:  Strength and tone appear normal for age. Extremities: No clubbing or cyanosis. No edema.  Distal pedal pulses are 2+ and equal bilaterally. Neuro: Alert and oriented X 3. No facial asymmetry. No focal deficit. Moves all extremities spontaneously. Decreased sensation throughout the entire left upper extremity. 4/5 grip strength left hand. 5/5 upper extremity strength bilaterally.  Psych:  Responds to questions appropriately with a normal affect.     Assessment and Plan: 58 year old female with history of multiple sclerosis, HTN, hyperlipidemia, ongoing tobacco abuse (30+ years), and  known CAD, s/p  PCI to LAD 2006 in [place], [place], repeat cath 12/23/2011, revealing patent stent along the LAD, and repeat cath 07/18/2014 revealing tandem lesions distally representing ISR, now s/p scoring balloon angioplasty and DES who presents with substernal chest pain.   1. Angina pectoris, atypical -Cardiac enzymes are negative to date -EKG is normal -It would be unlikely for her to have ISR without changes  -She has not missed any medication doses, per her report -ACS unlikely with extremity weakness  2. Left arm dysesthesia -She is now approximately 4 weeks late with her Tysabri treatment -This certainly could be a MS flare -Consider getting prior MRI records, neurologist in [place], [place] and obtaining MRI to compare -Less likely given the timing of her symptoms is a small plaque from her cath -Consider neuro consult  3. HTN -Controlled -Continue current regimen -Unable to take bb 2/2 Raynaud's   4. Hyperlipidemia -On Lipitor -Lipids checked beginning of August   Signed, Roselinda Bahena PA-C 07/24/2014, 2:45 PM

## 2014-07-24 NOTE — ED Notes (Signed)
Per EMS, pt had cardiac stent place years ago, on wed- stent had 90% occlusion--and had replaced; having L arm tingling, night time angina, and nausea; pt reports she has MS, took 5 81 ASA, plavix, and 3 nitro- with no improvement; 12 lead ekg unremarkable; vs- BP 126/73, HR 74, 100% RA

## 2014-07-24 NOTE — ED Notes (Signed)
Cardiology at bedside.

## 2014-07-24 NOTE — Consult Note (Signed)
Pt. Seen and examined. Agree with the NP/PA-C note as written. 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 The symptoms have gradually resolved. She had PCI for ISR in the LAD 6 days ago which was uncomplicated from the left radial artery. She felt no different after the procedure. She reports feeling nauseated, weak and basically "blah" since then. She also has MS and is followed by a neurologist.  She is getting monthly infusions, but is overdue for her most recent treatment. To her, these symptoms are different than her prior MS symptoms.  EKG is normal. Troponin is negative. Symptoms have essentially resolved, except neuro exam is notable for 4/5 grip strength in her left arm, normal strength in the remaining extremities. CN II-XII grossly intact. RRR, no murmur.  I do not suspect her pain is due to a problem with her recently placed stents, which would present more ominously with EKG changes or other abnormalities. I think she is having a flare of her MS, possibly related to recent procedures, stress and the duration since her last medical infusion.  I would recommend checking a second troponin.  If negative, then she could be discharged to follow-up in the office. She should follow-up with her neurologist as well.  Thanks for consulting [place].  Pixie Casino, MD, [facility] Attending Cardiologist [place]

## 2014-07-24 NOTE — ED Notes (Signed)
Portable xray at bedside.

## 2014-07-24 NOTE — ED Provider Notes (Signed)
CSN: [id number]     Arrival date & time 07/24/14  1257 History   First MD Initiated Contact with Patient 07/24/14 1311     Chief Complaint  Patient presents with  . Chest Pain     (Consider location/radiation/quality/duration/timing/severity/associated sxs/prior Treatment) Patient is a 58 y.o. female presenting with chest pain. The history is provided by the patient.  Chest Pain Pain location:  Substernal area Pain quality: pressure   Pain radiates to:  L arm Pain radiates to the back: no   Pain severity:  Moderate Onset quality:  Gradual Timing:  Constant Progression:  Unchanged Chronicity:  New Context: at rest   Context: not breathing, no drug use and no stress   Relieved by:  Nothing Worsened by:  Nothing tried Associated symptoms: nausea   Associated symptoms: no abdominal pain, no cough, no fever, no shortness of breath and not vomiting     Past Medical History  Diagnosis Date  . Coronary artery disease     s/p stent to LAD in 2006, 40% in stent restenosis 12/23/11  . Angina at rest   . MS (multiple sclerosis)     since 1997  . Dyslipidemia   . Anemia   . History of blood transfusion 1959  . GERD (gastroesophageal reflux disease)   . Chronic mid back pain     "T7-8 herniated disc that cannot be repaired"  . Raynaud's disease dx'd 2005   Past Surgical History  Procedure Laterality Date  . Cesarean section  X 2  . Knee arthroscopy Left   . Coronary angioplasty with stent placement  03/2005  . Cardiac catheterization  12/2011  . Coronary angioplasty with stent placement  07/18/2014    ISRS- Mid LAD: Promus Premier 2.75 x 16 mm DES    Family History  Problem Relation Age of Onset  . Colon polyps Mother   . Alzheimer's disease Mother   . Hypertension Mother   . Coronary artery disease Father     s/p CABG at 34   History  Substance Use Topics  . Smoking status: Current Every Day Smoker -- 0.50 packs/day for 32 years    Types: Cigars, Cigarettes  . Smokeless  tobacco: Never Used  . Alcohol Use: No   OB History   Grav Para Term Preterm Abortions TAB SAB Ect Mult Living                 Review of Systems  Constitutional: Negative for fever.  Respiratory: Negative for cough and shortness of breath.   Cardiovascular: Positive for chest pain.  Gastrointestinal: Positive for nausea. Negative for vomiting and abdominal pain.  All other systems reviewed and are negative.     Allergies  Simvastatin  Home Medications   Prior to Admission medications   Medication Sig Start Date End Date Taking? Authorizing Provider  amLODipine (NORVASC) 5 MG tablet Take 5 mg by mouth daily.    Historical Provider, MD  aspirin EC 81 MG tablet Take 81 mg by mouth daily.      Historical Provider, MD  atorvastatin (LIPITOR) 20 MG tablet Take 20 mg by mouth daily.    Historical Provider, MD  clopidogrel (PLAVIX) 75 MG tablet Take 75 mg by mouth daily.    Historical Provider, MD  Cyanocobalamin (VITAMIN B-12 IJ) Inject as directed once a week.    Historical Provider, MD  ezetimibe (ZETIA) 10 MG tablet Take 10 mg by mouth daily.    Historical Provider, MD  FLUoxetine (PROZAC) 20  MG tablet Take 20 mg by mouth daily.  05/13/14   Historical Provider, MD  lidocaine (LIDODERM) 5 % Place 2 patches onto the skin daily as needed. For pain Remove & Discard patch within 12 hours or as directed by MD     Historical Provider, MD  loratadine (CLARITIN) 10 MG tablet Take 10 mg by mouth daily as needed for allergies.    Historical Provider, MD  natalizumab (TYSABRI) 300 MG/15ML injection Inject 300 mg into the vein every 30 (thirty) days.     Historical Provider, MD  nitroGLYCERIN (NITROSTAT) 0.4 MG SL tablet Place 1 tablet (0.4 mg total) under the tongue every 5 (five) minutes as needed for chest pain. 12/24/11   Roger A Arguello, PA-C  omeprazole (PRILOSEC) 20 MG capsule Take 20 mg by mouth daily.    Historical Provider, MD  ranolazine (RANEXA) 500 MG 12 hr tablet Take 500 mg by mouth  2 (two) times daily.    Historical Provider, MD  traMADol (ULTRAM) 50 MG tablet Take 50 mg by mouth every 6 (six) hours as needed for moderate pain.     Historical Provider, MD  Vitamin D, Ergocalciferol, (DRISDOL) 50000 UNITS CAPS capsule Take 50,000 Units by mouth once a week.  05/13/14   Historical Provider, MD  zolpidem (AMBIEN CR) 6.25 MG CR tablet Take 6.25 mg by mouth at bedtime.     Historical Provider, MD   BP 128/75  Pulse 70  Temp(Src) 98.1 F (36.7 C) (Oral)  Resp 11  SpO2 100% Physical Exam  Nursing note and vitals reviewed. Constitutional: She is oriented to person, place, and time. She appears well-developed and well-nourished. No distress.  HENT:  Head: Normocephalic and atraumatic.  Mouth/Throat: Oropharynx is clear and moist.  Eyes: EOM are normal. Pupils are equal, round, and reactive to light.  Neck: Normal range of motion. Neck supple.  Cardiovascular: Normal rate and regular rhythm.  Exam reveals no friction rub.   No murmur heard. Pulmonary/Chest: Effort normal and breath sounds normal. No respiratory distress. She has no wheezes. She has no rales.  Abdominal: Soft. She exhibits no distension. There is no tenderness. There is no rebound.  Musculoskeletal: Normal range of motion. She exhibits no edema.  Neurological: She is alert and oriented to person, place, and time. No cranial nerve deficit. She exhibits normal muscle tone. Coordination normal.  L arm strength intact. Mild altered light touch sensation, but sensation is intact.  Skin: No rash noted. She is not diaphoretic.    ED Course  Procedures (including critical care time) Labs Review Labs Reviewed  CBC  BASIC METABOLIC PANEL  Randolm Idol, ED    Imaging Review Dg Chest Port 1 View  07/24/2014   CLINICAL DATA:  Chest pain  EXAM: PORTABLE CHEST - 1 VIEW  COMPARISON:  07/11/2014  FINDINGS: Cardiomediastinal silhouette is stable. No acute infiltrate or pleural effusion. No pulmonary edema. Bony  thorax is unremarkable.  IMPRESSION: No active disease.   Electronically Signed   By: Lahoma Crocker M.D.   On: 07/24/2014 13:45     EKG Interpretation None      Date: 07/24/2014  Rate: 77  Rhythm: normal sinus rhythm  QRS Axis: normal  Intervals: normal  ST/T Wave abnormalities: normal  Conduction Disutrbances:none  Narrative Interpretation:   Old EKG Reviewed: unchanged   MDM   Final diagnoses:  Coronary artery disease due to lipid rich plaque  MS (multiple sclerosis)  Other chest pain  Essential hypertension  Hyperlipidemia  58 year old female with history of MS, MI presents with chest and left arm numbness. Began this morning. No relief with aspirin and nitroglycerin. Had her first stent placed in 2006. One week ago had a cardiac cath showing 90% stent occlusion. She received a balloon angioplasty without stent and another stent was placed. Drug-eluting stents were placed. She has not felt well since then, but this morning had pressure-like chest pain which she described as, "increasing impairment or pressure in my chest" with associated left arm numbness. Here vitals are stable. Her lungs are clear. Left arm has altered touch sensation but sensation is intact. Normal left arm strength. I suspect this is secondary to her chest pain is not related to her stroke. Of note she is also 3 weeks late for her Tysabri injections for her MS. EKG unchanged from prior. Seen by Dr. Debara Pickett of [facility] - he believes this is secondary to her MS. EKG unchanged, serial troponins ok. Dr. Debara Pickett felt any post-stent placement problems would've presented much sooner and in much more severe of a fashion. Patient doing well at this time, relaxing comfortably. I offered CT to evaluate her MS, she wants to wait until her f/u Neurology appointment. No signs concerning for stroke here, has good strength and sensation in her L arm, but altered light touch.  Evelina Bucy, MD 07/25/14 [phone]

## 2014-07-24 NOTE — ED Notes (Signed)
Patient endorses left sided arm numbness and 10/10 left sided chest pressure. Pt endorses consistent nausea since catheterization last Wednesday. Pt has taken 3 nitro without relief and 324 ASA. Pt denies SOB, dizziness, weakness, blurry vision. Pt endorses one episode of worsening nausea and dizziness in ambulance. Pt in NAD, able to speak full sentences.

## 2014-08-01 ENCOUNTER — Other Ambulatory Visit: Payer: Self-pay | Admitting: Specialist

## 2014-08-01 DIAGNOSIS — G35 Multiple sclerosis: Secondary | ICD-10-CM

## 2014-08-01 DIAGNOSIS — R0989 Other specified symptoms and signs involving the circulatory and respiratory systems: Secondary | ICD-10-CM

## 2014-08-03 ENCOUNTER — Other Ambulatory Visit: Payer: Self-pay | Admitting: *Deleted

## 2014-08-03 MED ORDER — EZETIMIBE 10 MG PO TABS: 10.0000 mg | ORAL_TABLET | Freq: Every day | ORAL | Status: DC

## 2014-08-03 MED ORDER — RANOLAZINE ER 500 MG PO TB12: 500.0000 mg | ORAL_TABLET | Freq: Two times a day (BID) | ORAL | Status: DC

## 2014-08-03 MED ORDER — CLOPIDOGREL BISULFATE 75 MG PO TABS: 75.0000 mg | ORAL_TABLET | Freq: Every day | ORAL | Status: DC

## 2014-08-03 MED ORDER — AMLODIPINE BESYLATE 5 MG PO TABS: 5.0000 mg | ORAL_TABLET | Freq: Every day | ORAL | Status: DC

## 2014-08-03 MED ORDER — OMEPRAZOLE 20 MG PO CPDR: 20.0000 mg | DELAYED_RELEASE_CAPSULE | Freq: Every day | ORAL | Status: DC

## 2014-08-03 MED ORDER — ATORVASTATIN CALCIUM 20 MG PO TABS: 20.0000 mg | ORAL_TABLET | Freq: Every day | ORAL | Status: DC

## 2014-08-09 ENCOUNTER — Ambulatory Visit
Admission: RE | Admit: 2014-08-09 | Discharge: 2014-08-09 | Disposition: A | Discharge status: Home / Self Care | Payer: Managed Care, Other (non HMO) | Source: Ambulatory Visit | Attending: Specialist | Admitting: Specialist

## 2014-08-09 DIAGNOSIS — G35 Multiple sclerosis: Secondary | ICD-10-CM

## 2014-08-09 DIAGNOSIS — R0989 Other specified symptoms and signs involving the circulatory and respiratory systems: Secondary | ICD-10-CM

## 2014-08-09 MED ORDER — GADOBENATE DIMEGLUMINE 529 MG/ML IV SOLN
9.0000 mL | Freq: Once | INTRAVENOUS | Status: AC | PRN
  Administered 2014-08-09: 9 mL via INTRAVENOUS

## 2014-08-10 ENCOUNTER — Ambulatory Visit: Payer: Managed Care, Other (non HMO) | Admitting: Physician Assistant

## 2014-08-25 ENCOUNTER — Telehealth: Payer: Self-pay | Admitting: Internal Medicine

## 2014-08-25 MED ORDER — CIPROFLOXACIN HCL 0.3 % OP SOLN: 1.0000 [drp] | OPHTHALMIC | Status: DC

## 2014-08-25 NOTE — Telephone Encounter (Signed)
Pink eye. Unilateral, cloudy discharge. Potentially bacterial. Plan- Cipro opth gtts to [place] [phone]

## 2014-09-23 ENCOUNTER — Other Ambulatory Visit: Payer: Self-pay | Admitting: Cardiology

## 2014-11-22 ENCOUNTER — Encounter (HOSPITAL_COMMUNITY): Payer: Self-pay | Admitting: Cardiovascular Disease

## 2014-12-20 ENCOUNTER — Telehealth: Payer: Self-pay | Admitting: Internal Medicine

## 2014-12-20 MED ORDER — AZITHROMYCIN 250 MG PO TABS: ORAL_TABLET | ORAL | Status: DC

## 2014-12-20 NOTE — Telephone Encounter (Signed)
She and daughter Estill Bamberg have 1 week yellow sputum, cough, malaise. Sent Z pak

## 2015-04-10 ENCOUNTER — Encounter: Payer: Self-pay | Admitting: Gastroenterology

## 2015-11-03 ENCOUNTER — Other Ambulatory Visit: Payer: Self-pay | Admitting: Cardiology

## 2016-04-17 ENCOUNTER — Encounter (HOSPITAL_COMMUNITY): Payer: Self-pay

## 2016-04-17 ENCOUNTER — Emergency Department (HOSPITAL_COMMUNITY)
Admission: EM | Admit: 2016-04-17 | Discharge: 2016-04-17 | Disposition: A | Discharge status: Home / Self Care | Payer: PRIVATE HEALTH INSURANCE | Attending: Emergency Medicine | Admitting: Emergency Medicine

## 2016-04-17 ENCOUNTER — Emergency Department (HOSPITAL_COMMUNITY): Payer: PRIVATE HEALTH INSURANCE

## 2016-04-17 DIAGNOSIS — E785 Hyperlipidemia, unspecified: Secondary | ICD-10-CM | POA: Diagnosis not present

## 2016-04-17 DIAGNOSIS — Z792 Long term (current) use of antibiotics: Secondary | ICD-10-CM | POA: Insufficient documentation

## 2016-04-17 DIAGNOSIS — Z955 Presence of coronary angioplasty implant and graft: Secondary | ICD-10-CM | POA: Insufficient documentation

## 2016-04-17 DIAGNOSIS — I2510 Atherosclerotic heart disease of native coronary artery without angina pectoris: Secondary | ICD-10-CM | POA: Diagnosis not present

## 2016-04-17 DIAGNOSIS — R1032 Left lower quadrant pain: Secondary | ICD-10-CM | POA: Insufficient documentation

## 2016-04-17 DIAGNOSIS — Z79891 Long term (current) use of opiate analgesic: Secondary | ICD-10-CM | POA: Insufficient documentation

## 2016-04-17 DIAGNOSIS — F17210 Nicotine dependence, cigarettes, uncomplicated: Secondary | ICD-10-CM | POA: Diagnosis not present

## 2016-04-17 DIAGNOSIS — R109 Unspecified abdominal pain: Secondary | ICD-10-CM

## 2016-04-17 DIAGNOSIS — Z7982 Long term (current) use of aspirin: Secondary | ICD-10-CM | POA: Insufficient documentation

## 2016-04-17 DIAGNOSIS — Z96659 Presence of unspecified artificial knee joint: Secondary | ICD-10-CM | POA: Diagnosis not present

## 2016-04-17 DIAGNOSIS — Z791 Long term (current) use of non-steroidal anti-inflammatories (NSAID): Secondary | ICD-10-CM | POA: Diagnosis not present

## 2016-04-17 DIAGNOSIS — Z79899 Other long term (current) drug therapy: Secondary | ICD-10-CM | POA: Diagnosis not present

## 2016-04-17 DIAGNOSIS — K219 Gastro-esophageal reflux disease without esophagitis: Secondary | ICD-10-CM | POA: Diagnosis not present

## 2016-04-17 DIAGNOSIS — G35 Multiple sclerosis: Secondary | ICD-10-CM | POA: Diagnosis not present

## 2016-04-17 LAB — URINALYSIS, ROUTINE W REFLEX MICROSCOPIC
Bilirubin Urine: NEGATIVE
Glucose, UA: NEGATIVE mg/dL
Hgb urine dipstick: NEGATIVE
Ketones, ur: NEGATIVE mg/dL
Leukocytes, UA: NEGATIVE
Nitrite: NEGATIVE
Protein, ur: NEGATIVE mg/dL
Specific Gravity, Urine: 1.002 — ABNORMAL LOW (ref 1.005–1.030)
pH: 7 (ref 5.0–8.0)

## 2016-04-17 MED ORDER — KETOROLAC TROMETHAMINE 30 MG/ML IJ SOLN
15.0000 mg | Freq: Once | INTRAMUSCULAR | Status: AC
  Administered 2016-04-17: 15 mg via INTRAVENOUS
  Filled 2016-04-17: qty 1

## 2016-04-17 MED ORDER — FENTANYL CITRATE (PF) 100 MCG/2ML IJ SOLN
25.0000 ug | INTRAMUSCULAR | Status: DC | PRN
  Administered 2016-04-17: 25 ug via INTRAVENOUS
  Filled 2016-04-17: qty 2

## 2016-04-17 MED ORDER — IBUPROFEN 600 MG PO TABS: 600.0000 mg | ORAL_TABLET | Freq: Four times a day (QID) | ORAL | Status: DC | PRN

## 2016-04-17 NOTE — ED Notes (Signed)
Pt c/o 10/10 left flank pain w/ radiation to left groin that has progressively become worse w/ no relief after taking Oxycodone since yesterday AM. Pt states she was partaking in heaving lifting this past weekend. Pt denies hx of kidney stones and dysuria. Pt A+OX4, speaking in complete sentences, grimacing and hunched over while ambulating to treatment room.

## 2016-04-17 NOTE — Progress Notes (Signed)
Pt states her neurologist sees her for the majority of her medical issues and follow up Confirms no pcp but also has a Cv  EPIC updated

## 2016-04-17 NOTE — ED Notes (Signed)
Patient transported to CT 

## 2016-04-17 NOTE — ED Notes (Signed)
PT DISCHARGED. INSTRUCTIONS AND PRESCRIPTION GIVEN. AAOX3. PT IN NO APPARENT DISTRESS OR PAIN. THE OPPORTUNITY TO ASK QUESTIONS WAS PROVIDED. 

## 2016-04-17 NOTE — Discharge Instructions (Signed)
Flank Pain °Flank pain refers to pain that is located on the side of the body between the upper abdomen and the back. The pain may occur over a short period of time (acute) or may be long-term or reoccurring (chronic). It may be mild or severe. Flank pain can be caused by many things. °CAUSES  °Some of the more common causes of flank pain include: °· Muscle strains.   °· Muscle spasms.   °· A disease of your spine (vertebral disk disease).   °· A lung infection (pneumonia).   °· Fluid around your lungs (pulmonary edema).   °· A kidney infection.   °· Kidney stones.   °· A very painful skin rash caused by the chickenpox virus (shingles).   °· Gallbladder disease.   °HOME CARE INSTRUCTIONS  °Home care will depend on the cause of your pain. In general, °· Rest as directed by your caregiver. °· Drink enough fluids to keep your urine clear or pale yellow. °· Only take over-the-counter or prescription medicines as directed by your caregiver. Some medicines may help relieve the pain. °· Tell your caregiver about any changes in your pain. °· Follow up with your caregiver as directed. °SEEK IMMEDIATE MEDICAL CARE IF:  °· Your pain is not controlled with medicine.   °· You have new or worsening symptoms. °· Your pain increases.   °· You have abdominal pain.   °· You have shortness of breath.   °· You have persistent nausea or vomiting.   °· You have swelling in your abdomen.   °· You feel faint or pass out.   °· You have blood in your urine. °· You have a fever or persistent symptoms for more than 2-3 days. °· You have a fever and your symptoms suddenly get worse. °MAKE SURE YOU:  °· Understand these instructions. °· Will watch your condition. °· Will get help right away if you are not doing well or get worse. °  °This information is not intended to replace advice given to you by your health care provider. Make sure you discuss any questions you have with your health care provider. °  °Document Released: 01/21/2006 Document  Revised: 08/24/2012 Document Reviewed: 07/14/2012 °Elsevier Interactive Patient Education ©2016 [employer] ° °

## 2016-04-17 NOTE — ED Provider Notes (Signed)
CSN: [id number]     Arrival date & time 04/17/16  1107 History   First MD Initiated Contact with Patient 04/17/16 1130     Chief Complaint  Patient presents with  . Flank Pain     (Consider location/radiation/quality/duration/timing/severity/associated sxs/prior Treatment) Patient is a 60 y.o. female presenting with flank pain. The history is provided by the patient.  Flank Pain This is a new problem. The current episode started 12 to 24 hours ago. The problem occurs constantly. The problem has been gradually worsening. Associated symptoms include abdominal pain (LLQ and groin). Pertinent negatives include no chest pain and no shortness of breath. The symptoms are aggravated by bending, exertion, twisting and standing. Nothing relieves the symptoms. Treatments tried: norco. The treatment provided no relief.    Past Medical History  Diagnosis Date  . Coronary artery disease     a) s/p stent to LAD in 2006 in [place], [place]. b) 40% in stent restenosis 12/23/11. c) tandem 70% lesions distal LAD with ISR s/p balloon angio and DES.  Marland Kitchen Angina at rest [facility])   . MS (multiple sclerosis) ([place])     a) since 1997. b) Imdur with medication interactions.  . Dyslipidemia   . Anemia   . History of blood transfusion 1959  . GERD (gastroesophageal reflux disease)   . Chronic mid back pain     "T7-8 herniated disc that cannot be repaired"  . Raynaud's disease dx'd 2005    bb contraindicated  . Tobacco use     30+ pack years   Past Surgical History  Procedure Laterality Date  . Cesarean section  X 2  . Knee arthroscopy Left   . Coronary angioplasty with stent placement  03/2005  . Cardiac catheterization  12/2011  . Coronary angioplasty with stent placement  07/18/2014    ISRS- Mid LAD: Promus Premier 2.75 x 16 mm DES   . Left heart catheterization with coronary angiogram N/A 12/23/2011    Procedure: LEFT HEART CATHETERIZATION WITH CORONARY ANGIOGRAM;  Surgeon: Wellington Hampshire, MD;  Location: [facility] CATH  LAB;  Service: Cardiovascular;  Laterality: N/A;  . Left heart catheterization with coronary angiogram N/A 07/18/2014    Procedure: LEFT HEART CATHETERIZATION WITH CORONARY ANGIOGRAM;  Surgeon: Sinclair Grooms, MD;  Location: [facility] CATH LAB;  Service: Cardiovascular;  Laterality: N/A;   Family History  Problem Relation Age of Onset  . Colon polyps Mother   . Alzheimer's disease Mother   . Hypertension Mother   . Coronary artery disease Father     s/p CABG at 6   Social History  Substance Use Topics  . Smoking status: Current Every Day Smoker -- 0.50 packs/day for 32 years    Types: Cigars, Cigarettes  . Smokeless tobacco: Never Used  . Alcohol Use: No   OB History    No data available     Review of Systems  Respiratory: Negative for shortness of breath.   Cardiovascular: Negative for chest pain.  Gastrointestinal: Positive for abdominal pain (LLQ and groin).  Genitourinary: Positive for flank pain.  All other systems reviewed and are negative.     Allergies  Morphine and related and Simvastatin  Home Medications   Prior to Admission medications   Medication Sig Start Date End Date Taking? Authorizing Provider  amLODipine (NORVASC) 5 MG tablet TAKE 1 TABLET (5 MG TOTAL) BY MOUTH DAILY. 11/04/15   Minus Breeding, MD  aspirin EC 81 MG tablet Take 81 mg by mouth daily.  Historical Provider, MD  atorvastatin (LIPITOR) 20 MG tablet TAKE 1 TABLET (20 MG TOTAL) BY MOUTH DAILY. 09/24/14   Minus Breeding, MD  azithromycin (ZITHROMAX) 250 MG tablet 2 today then one daily 12/20/14   Deneise Lever, MD  ciprofloxacin (CILOXAN) 0.3 % ophthalmic solution Place 1 drop into the left eye every 2 (two) hours. Administer 1 drop, every 2 hours, while awake, for 2 days. Then 1 drop, every 4 hours, while awake, for the next 5 days. 08/25/14   Deneise Lever, MD  clopidogrel (PLAVIX) 75 MG tablet TAKE 1 TABLET (75 MG TOTAL) BY MOUTH DAILY. 11/04/15   Minus Breeding, MD  Cyanocobalamin (VITAMIN  B-12 IJ) Inject as directed once a week. Last given over 2 weeks ago from today (8-11-5)    Historical Provider, MD  FLUoxetine (PROZAC) 20 MG tablet Take 20 mg by mouth daily.  05/13/14   Historical Provider, MD  ibuprofen (ADVIL,MOTRIN) 600 MG tablet Take 1 tablet (600 mg total) by mouth every 6 (six) hours as needed. 04/17/16   Leo Grosser, MD  lidocaine (LIDODERM) 5 % Place 2 patches onto the skin daily as needed. For pain Remove & Discard patch within 12 hours or as directed by MD     Historical Provider, MD  natalizumab (TYSABRI) 300 MG/15ML injection Inject 300 mg into the vein every 30 (thirty) days. Last given seven weeks ago from today(07-25-15)    Historical Provider, MD  nitroGLYCERIN (NITROSTAT) 0.4 MG SL tablet Place 1 tablet (0.4 mg total) under the tongue every 5 (five) minutes as needed for chest pain. 12/24/11   Roger A Arguello, PA-C  omeprazole (PRILOSEC) 20 MG capsule TAKE ONE CAPSULE BY MOUTH EVERY DAY 09/24/14   Minus Breeding, MD  omeprazole (PRILOSEC) 20 MG capsule TAKE 1 CAPSULE (20 MG TOTAL) BY MOUTH DAILY. 09/24/14   Minus Breeding, MD  RANEXA 500 MG 12 hr tablet TAKE 1 TABLET (500 MG TOTAL) BY MOUTH 2 (TWO) TIMES DAILY. 09/24/14   Minus Breeding, MD  traMADol (ULTRAM) 50 MG tablet Take 50 mg by mouth every 6 (six) hours as needed for moderate pain.     Historical Provider, MD  Vitamin D, Ergocalciferol, (DRISDOL) 50000 UNITS CAPS capsule Take 50,000 Units by mouth once a week.  05/13/14   Historical Provider, MD  ZETIA 10 MG tablet TAKE 1 TABLET (10 MG TOTAL) BY MOUTH DAILY. 11/04/15   Minus Breeding, MD  zolpidem (AMBIEN CR) 6.25 MG CR tablet Take 6.25 mg by mouth at bedtime.     Historical Provider, MD   BP 115/71 mmHg  Pulse 59  Temp(Src) 97.8 F (36.6 C) (Oral)  Resp 16  Ht 5\' 2"  (1.575 m)  Wt 103 lb (46.72 kg)  BMI 18.83 kg/m2  SpO2 100% Physical Exam  Constitutional: She is oriented to person, place, and time. She appears well-developed and well-nourished. No  distress.  HENT:  Head: Normocephalic.  Eyes: Conjunctivae are normal.  Neck: Neck supple. No tracheal deviation present.  Cardiovascular: Normal rate and regular rhythm.   Pulmonary/Chest: Effort normal. No respiratory distress.  Abdominal: Soft. She exhibits no distension. There is tenderness in the left lower quadrant. There is no rigidity, no rebound, no guarding, no tenderness at McBurney's point and negative Murphy's sign.  Musculoskeletal:       Lumbar back: She exhibits tenderness.       Back:  Neurological: She is alert and oriented to person, place, and time.  Skin: Skin is warm and dry.  Psychiatric: She has a normal mood and affect.    ED Course  Procedures (including critical care time) Labs Review Labs Reviewed  URINALYSIS, ROUTINE W REFLEX MICROSCOPIC (NOT AT [facility]) - Abnormal; Notable for the following:    Specific Gravity, Urine 1.002 (*)    All other components within normal limits    Imaging Review Ct Renal Stone Study  04/17/2016  CLINICAL DATA:  Two day history of left flank pain EXAM: CT ABDOMEN AND PELVIS WITHOUT CONTRAST TECHNIQUE: Multidetector CT imaging of the abdomen and pelvis was performed following the standard protocol without oral or intravenous contrast material administration. COMPARISON:  None. FINDINGS: Lower chest: On axial slice 7 series 3, there is a nodular opacity in the right middle lobe measuring 5 x 4 mm. A nearby 2 mm nodular opacity is present in the right middle lobe on axial slice 5 series 3. Lung bases otherwise are clear. Hepatobiliary: Liver measures 16.4 cm in length. No focal liver lesions are identified on this noncontrast enhanced study. Gallbladder wall is not appreciably thickened. There is no biliary duct dilatation. Pancreas: There is no pancreatic mass or inflammatory focus. Spleen: No splenic lesions are evident. Adrenals/Urinary Tract: Adrenals appear normal bilaterally. Kidneys bilaterally show no evidence of mass or  hydronephrosis on either side. There is no demonstrable renal or ureteral calculus on either side. The urinary bladder is mildly distended. Urinary bladder wall thickness is normal with urinary bladder midline in location. Stomach/Bowel: There is no bowel wall or mesenteric thickening. No bowel obstruction. No free air or portal venous air. Vascular/Lymphatic: There is atherosclerotic calcification in the aorta. There is no demonstrable abdominal aortic aneurysm. No vascular lesions are evident beyond atherosclerotic calcification on this noncontrast enhanced study. No adenopathy is apparent in the abdomen or pelvis. Reproductive: Uterus is anteverted. There is no pelvic mass or pelvic fluid collection. Other: The appendix appears normal. No abscess or ascites is evident in the abdomen or pelvis. Musculoskeletal: There are no blastic or lytic bone lesions. No abdominal wall or intramuscular lesion evident. IMPRESSION: No renal or ureteral calculus. No hydronephrosis. No urinary bladder wall thickening. No bowel obstruction.  No abscess.  Appendix appears normal. Atherosclerotic calcification in the aorta and common iliac arteries. Nodular opacities right middle lobe, largest measuring 5 x 4 mm. No follow-up needed if patient is low-risk (and has no known or suspected primary neoplasm). Non-contrast chest CT can be considered in 12 months if patient is high-risk. This recommendation follows the consensus statement: Guidelines for Management of Incidental Pulmonary Nodules Detected on CT Images:From the Fleischner Society 2017; published online before print (10.1148/radiol.IJ:2314499). Electronically Signed   By: Lowella Grip III M.D.   On: 04/17/2016 13:44   I have personally reviewed and evaluated these images and lab results as part of my medical decision-making.   EKG Interpretation None      MDM   Final diagnoses:  Left flank pain    60 y.o. female presents with left flank pain radiating into  groin. Happened over last 24 hours and has not abated. Signs concerning for renal colic or musculoskeletal etiology. UA negative for blood. Discussed options for workup including empiric treatment, XR/US or CT and Pt opted for definitive study. No stone noted and no other significant abnormality is apparent, likely MSK in nature. Patient was recommended to take short course of scheduled NSAIDs and engage in early mobility as definitive treatment.     Leo Grosser, MD 04/17/16 [phone]

## 2016-08-31 ENCOUNTER — Telehealth: Payer: Self-pay | Admitting: Cardiology

## 2016-08-31 ENCOUNTER — Encounter: Payer: Self-pay | Admitting: Physician Assistant

## 2016-08-31 NOTE — Telephone Encounter (Signed)
Received call from patient- pt reports episodes of CP x 3-4 days.  Reports episodes of left arm pain, left jaw pain, chest heaviness, increased fatigue, woke her up over the weekend and she vomitted x 1, reports taking up to 3 NTG for relief of pain.  Last took NTG yesterday. Denies pain at current, no NTG today.  Reports taking Ranexa BID.  Reports in 2015 daughter became sick and her health took the "backburner" but is concerned given that it has happened consistently for 3-4 days.    Advised if symptoms reoccur, proceed to ER for evaluation.  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  Requesting to be seen this week if possible.    Appt made for 9/20 @ 11:30 with Melina Copa PA at [facility].  Pt verbalized understanding.

## 2016-09-01 ENCOUNTER — Encounter: Payer: Self-pay | Admitting: Physician Assistant

## 2016-09-01 NOTE — Progress Notes (Addendum)
Cardiology Office Note    Date:  09/02/2016  ID:  TARANIKA MARRA, DOB [birth year], MRN [id number] PCP:  No PCP Per Patient  Cardiologist:  Dr. Percival Spanish  Chief Complaint: chest pain  History of Present Illness:  Crystal Clark is a 60 y.o. female with history of CAD (s/p PCI to LAD 2006 in [place], unchanged cath 2013, PTCA/DES to distal LAD stent ISR in 2015), multiple sclerosis, hyperlipidemia, tobacco abuse, GERD, Raynaud's disease who presents back to clinic for evaluation of chest pain. Last cath was at time of PTCA as above with widely patent LCx and RCA and EF of 70%. No labs available since 2015. Has been intolerant of Imdur due to interaction with MS meds and can not take BB due to Raynaud's disease. 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 She has been managed in the past with Ranexa for microvascular disease, but her symptoms have gotten significantly worse over the last 6 days. On Thursday evening she began to notice chest pain any time she exerted herself. She felt it while feeding the horses and lifting hay, as well as cleaning up after the puppies at her home. (Her daughter rescues puppies for the [facility].) This was associated with radiation to her jaw/arm and SOB. She also has had nocturnal angina associated with vomiting. She has had rare episodes of discomfort at rest. She feels a general sense of fatigue which is similar to prior angina. She is presently chest pain free.   Of note the patient requested refills on several of her cardiac meds - we did not prescribe today as some may change in the hospital based on findings. This will need to be addressed at discharge.   Past Medical History:  Diagnosis Date  . Anemia   . Chronic mid back pain    "T7-8 herniated disc  that cannot be repaired"  . Coronary artery disease    a) s/p stent to LAD in 2006 in [place], [place]. b) unchanged cath 2013. c) tandem 70% lesions distal LAD with ISR s/p balloon angio and DES 07/2014.  Marland Kitchen Dyslipidemia   . Essential hypertension   . GERD (gastroesophageal reflux disease)   . History of blood transfusion 1959  . MS (multiple sclerosis) ([place])    a) since 1997. b) Imdur with medication interactions.  . Raynaud's disease dx'd 2005   bb contraindicated  . Tobacco use    30+ pack years    Past Surgical History:  Procedure Laterality Date  . CARDIAC CATHETERIZATION  12/2011  . CESAREAN SECTION  X 2  . CORONARY ANGIOPLASTY WITH STENT PLACEMENT  03/2005  . CORONARY ANGIOPLASTY WITH STENT PLACEMENT  07/18/2014   ISRS- Mid LAD: Promus Premier 2.75 x 16 mm DES   . KNEE ARTHROSCOPY Left   . LEFT HEART CATHETERIZATION WITH CORONARY ANGIOGRAM N/A 12/23/2011   Procedure: LEFT HEART CATHETERIZATION WITH CORONARY ANGIOGRAM;  Surgeon: Wellington Hampshire, MD;  Location: [facility] CATH LAB;  Service: Cardiovascular;  Laterality: N/A;  . LEFT HEART CATHETERIZATION WITH CORONARY ANGIOGRAM N/A 07/18/2014   Procedure: LEFT HEART CATHETERIZATION WITH CORONARY ANGIOGRAM;  Surgeon: Sinclair Grooms, MD;  Location: [facility] CATH LAB;  Service: Cardiovascular;  Laterality: N/A;    Current Medications: Current Outpatient Prescriptions  Medication Sig Dispense Refill  .  amLODipine (NORVASC) 5 MG tablet TAKE 1 TABLET (5 MG TOTAL) BY MOUTH DAILY. 30 tablet 0  . aspirin EC 81 MG tablet Take 81 mg by mouth daily.      Marland Kitchen atorvastatin (LIPITOR) 20 MG tablet TAKE 1 TABLET (20 MG TOTAL) BY MOUTH DAILY. 30 tablet 10  . ciprofloxacin (CILOXAN) 0.3 % ophthalmic solution Place 1 drop into the left eye every 2 (two) hours. Administer 1 drop, every 2 hours, while awake, for 2 days. Then 1 drop, every 4 hours, while awake, for the next 5 days. 5 mL 1  . clopidogrel (PLAVIX) 75 MG tablet TAKE 1 TABLET (75 MG TOTAL) BY MOUTH DAILY. 30  tablet 0  . Cyanocobalamin (VITAMIN B-12 IJ) Inject as directed once a week. Last given over 2 weeks ago from today (8-11-5)    . FLUoxetine (PROZAC) 20 MG tablet Take 20 mg by mouth daily.     Marland Kitchen LAMOTRIGINE PO Take 1 tablet by mouth 2 (two) times daily.    Marland Kitchen lidocaine (LIDODERM) 5 % Place 2 patches onto the skin daily as needed. For pain Remove & Discard patch within 12 hours or as directed by MD     . natalizumab (TYSABRI) 300 MG/15ML injection Inject 300 mg into the vein every 30 (thirty) days. Last given seven weeks ago from today(07-25-15)    . nitroGLYCERIN (NITROSTAT) 0.4 MG SL tablet Place 1 tablet (0.4 mg total) under the tongue every 5 (five) minutes as needed for chest pain. 25 tablet 3  . omeprazole (PRILOSEC) 20 MG capsule TAKE ONE CAPSULE BY MOUTH EVERY DAY 30 capsule 10  . omeprazole (PRILOSEC) 20 MG capsule TAKE 1 CAPSULE (20 MG TOTAL) BY MOUTH DAILY. 30 capsule 10  . Pregabalin (LYRICA PO) Take 1 capsule by mouth 2 (two) times daily.    Marland Kitchen RANEXA 500 MG 12 hr tablet TAKE 1 TABLET (500 MG TOTAL) BY MOUTH 2 (TWO) TIMES DAILY. 60 tablet 10  . traMADol (ULTRAM) 50 MG tablet Take 50 mg by mouth every 6 (six) hours as needed for moderate pain.     . Vitamin D, Ergocalciferol, (DRISDOL) 50000 UNITS CAPS capsule Take 50,000 Units by mouth once a week.     Marland Kitchen ZETIA 10 MG tablet TAKE 1 TABLET (10 MG TOTAL) BY MOUTH DAILY. 30 tablet 0  . zolpidem (AMBIEN CR) 6.25 MG CR tablet Take 6.25 mg by mouth at bedtime.      No current facility-administered medications for this visit.      Allergies:   Morphine and related and Simvastatin   Social History   Social History  . Marital status: Married    Spouse name: N/A  . Number of children: N/A  . Years of education: N/A   Occupational History  . 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 Smoking status: Current Every Day Smoker    Packs/day: 0.50    Years: 32.00    Types: Cigars, Cigarettes  . Smokeless  tobacco: Never Used  . Alcohol use No  . Drug use: No  . Sexual activity: Not Asked   Other Topics Concern  . None   Social History Narrative  . None     Family History:  The patient's family history includes Alzheimer's disease in her mother; Colon polyps in her mother; Coronary artery disease in her father; Hypertension in her mother.   ROS:   Please see the history of present illness.  No  bleeding. All other systems are reviewed and otherwise negative.    PHYSICAL EXAM:   VS:  BP 114/72   Pulse 70   Ht 5\' 2"  (1.575 m)   Wt 102 lb 6.4 oz (46.4 kg)   BMI 18.73 kg/m   BMI: Body mass index is 18.73 kg/m. GEN: Well nourished, well developed WF, in no acute distress  HEENT: normocephalic, atraumatic Neck: no JVD, carotid bruits, or masses Cardiac: RRR; no murmurs, rubs, or gallops, no edema  Respiratory:  clear to auscultation bilaterally, normal work of breathing GI: soft, nontender, nondistended, + BS MS: no deformity or atrophy  Skin: warm and dry, no rash Neuro:  Alert and Oriented x 3, Strength and sensation are intact, follows commands Psych: euthymic mood, full affect  Wt Readings from Last 3 Encounters:  09/02/16 102 lb 6.4 oz (46.4 kg)  04/17/16 103 lb (46.7 kg)  07/19/14 101 lb 6.6 oz (46 kg)      Studies/Labs Reviewed:   EKG:  EKG was ordered today and personally reviewed by me and demonstrates NSR 67bpm no acute changes, QTc 446ms  Recent Labs: No results found for requested labs within last 8760 hours.   Lipid Panel    Component Value Date/Time   CHOL 206 (H) 07/11/2014 1223   TRIG 129 07/11/2014 1223   HDL 88 07/11/2014 1223   CHOLHDL 2.3 07/11/2014 1223   VLDL 26 07/11/2014 1223   LDLCALC 92 07/11/2014 1223    Additional studies/ records that were reviewed today include: Summarized above.    ASSESSMENT & PLAN:   1. Chest pain concerning for unstable angina - patient seen/examined with Dr. Meda Coffee. Her symptoms are worrisome. She's begun  to have some discomfort at rest as well. Will plan to admit, check labs, start on IV heparin. Cath board is full today so we will let her eat and plan on cath tomorrow with Dr. Ellyn Hack at noon. Risks and benefits of cardiac catheterization have been discussed with the patient.  These include bleeding, infection, kidney damage, stroke, heart attack, death.  The patient understands these risks and is willing to proceed. Will not start any IV NTG due to h/o interaction between Imdur and her MS medications. I spoke with Bernerd Pho PA-C who is covering for our hospital team leader Birdie Sons today to make her aware. There are no beds available for direct admission so we will have to send her to the ER. I will write her admission orders upon her arrival. 2. CAD - as above. Continue ASA, Plavix, statin. Not on BB due to Raynaud's disease. Will need to d/c omeprazole and start Protonix given Plavix interaction. If restenosis is found on cath would consider P2Y12 testing. Of note the patient requested refills on several of her cardiac meds - we did not prescribe today as some may change in the hospital based on findings. This will need to be addressed at discharge. 3. MS - on Tysabri as outpatient. 4. Hyperlipidemia - continue statin. She's had issues with MS with simvastatin in the past so I will not titrate dose today. 5. Tobacco abuse - she acknowledges the need to quit. Cessation advised.  Disposition: F/u TBD post-cath.   Medication Adjustments/Labs and Tests Ordered: Current medicines are reviewed at length with the patient today.  Concerns regarding medicines are outlined above. Medication changes, Labs and Tests ordered today are summarized above and listed in the Patient Instructions accessible in Encounters.   Signed, Melina Copa PA-C  09/02/2016 12:06 PM  [facility] [place], [place], [place]  [address] Phone: [phone]; Fax: [phone]

## 2016-09-02 ENCOUNTER — Observation Stay (HOSPITAL_COMMUNITY)
Admission: EM | Admit: 2016-09-02 | Discharge: 2016-09-03 | Disposition: A | Discharge status: Home / Self Care | Payer: PRIVATE HEALTH INSURANCE | Attending: Cardiology | Admitting: Cardiology

## 2016-09-02 ENCOUNTER — Emergency Department (HOSPITAL_COMMUNITY): Payer: PRIVATE HEALTH INSURANCE

## 2016-09-02 ENCOUNTER — Encounter: Payer: Self-pay | Admitting: Physician Assistant

## 2016-09-02 ENCOUNTER — Encounter (HOSPITAL_COMMUNITY): Payer: Self-pay | Admitting: Emergency Medicine

## 2016-09-02 ENCOUNTER — Ambulatory Visit (INDEPENDENT_AMBULATORY_CARE_PROVIDER_SITE_OTHER): Payer: PRIVATE HEALTH INSURANCE | Admitting: Physician Assistant

## 2016-09-02 VITALS — BP 114/72 | HR 70 | Ht 62.0 in | Wt 102.4 lb

## 2016-09-02 DIAGNOSIS — Z79899 Other long term (current) drug therapy: Secondary | ICD-10-CM | POA: Insufficient documentation

## 2016-09-02 DIAGNOSIS — Z9861 Coronary angioplasty status: Secondary | ICD-10-CM

## 2016-09-02 DIAGNOSIS — F17210 Nicotine dependence, cigarettes, uncomplicated: Secondary | ICD-10-CM | POA: Insufficient documentation

## 2016-09-02 DIAGNOSIS — I25110 Atherosclerotic heart disease of native coronary artery with unstable angina pectoris: Secondary | ICD-10-CM | POA: Diagnosis not present

## 2016-09-02 DIAGNOSIS — F17290 Nicotine dependence, other tobacco product, uncomplicated: Secondary | ICD-10-CM | POA: Insufficient documentation

## 2016-09-02 DIAGNOSIS — I7300 Raynaud's syndrome without gangrene: Secondary | ICD-10-CM

## 2016-09-02 DIAGNOSIS — I2510 Atherosclerotic heart disease of native coronary artery without angina pectoris: Secondary | ICD-10-CM

## 2016-09-02 DIAGNOSIS — Z7982 Long term (current) use of aspirin: Secondary | ICD-10-CM | POA: Diagnosis not present

## 2016-09-02 DIAGNOSIS — I10 Essential (primary) hypertension: Secondary | ICD-10-CM | POA: Diagnosis not present

## 2016-09-02 DIAGNOSIS — K219 Gastro-esophageal reflux disease without esophagitis: Secondary | ICD-10-CM | POA: Diagnosis not present

## 2016-09-02 DIAGNOSIS — G35 Multiple sclerosis: Secondary | ICD-10-CM | POA: Diagnosis not present

## 2016-09-02 DIAGNOSIS — Z7902 Long term (current) use of antithrombotics/antiplatelets: Secondary | ICD-10-CM | POA: Insufficient documentation

## 2016-09-02 DIAGNOSIS — Z720 Tobacco use: Secondary | ICD-10-CM

## 2016-09-02 DIAGNOSIS — R079 Chest pain, unspecified: Secondary | ICD-10-CM

## 2016-09-02 DIAGNOSIS — Z955 Presence of coronary angioplasty implant and graft: Secondary | ICD-10-CM | POA: Insufficient documentation

## 2016-09-02 DIAGNOSIS — I200 Unstable angina: Secondary | ICD-10-CM

## 2016-09-02 DIAGNOSIS — E785 Hyperlipidemia, unspecified: Secondary | ICD-10-CM

## 2016-09-02 DIAGNOSIS — I209 Angina pectoris, unspecified: Secondary | ICD-10-CM | POA: Diagnosis present

## 2016-09-02 LAB — CBC WITH DIFFERENTIAL/PLATELET
Basophils Absolute: 0.1 10*3/uL (ref 0.0–0.1)
Basophils Relative: 1 %
Eosinophils Absolute: 0.4 10*3/uL (ref 0.0–0.7)
Eosinophils Relative: 4 %
HCT: 38.9 % (ref 36.0–46.0)
Hemoglobin: 12.8 g/dL (ref 12.0–15.0)
Lymphocytes Relative: 40 %
Lymphs Abs: 3.3 10*3/uL (ref 0.7–4.0)
MCH: 30.3 pg (ref 26.0–34.0)
MCHC: 32.9 g/dL (ref 30.0–36.0)
MCV: 92 fL (ref 78.0–100.0)
Monocytes Absolute: 0.5 10*3/uL (ref 0.1–1.0)
Monocytes Relative: 5 %
Neutro Abs: 4.1 10*3/uL (ref 1.7–7.7)
Neutrophils Relative %: 50 %
Platelets: 307 10*3/uL (ref 150–400)
RBC: 4.23 MIL/uL (ref 3.87–5.11)
RDW: 13.7 % (ref 11.5–15.5)
WBC: 8.3 10*3/uL (ref 4.0–10.5)

## 2016-09-02 LAB — COMPREHENSIVE METABOLIC PANEL
ALT: 14 U/L (ref 14–54)
AST: 19 U/L (ref 15–41)
Albumin: 4.2 g/dL (ref 3.5–5.0)
Alkaline Phosphatase: 75 U/L (ref 38–126)
Anion gap: 8 (ref 5–15)
BUN: <5 mg/dL — ABNORMAL LOW (ref 6–20)
CO2: 23 mmol/L (ref 22–32)
Calcium: 9.1 mg/dL (ref 8.9–10.3)
Chloride: 106 mmol/L (ref 101–111)
Creatinine, Ser: 0.61 mg/dL (ref 0.44–1.00)
GFR calc Af Amer: >60 mL/min (ref >60)
GFR calc non Af Amer: >60 mL/min (ref >60)
Glucose, Bld: 85 mg/dL (ref 65–99)
Potassium: 4.1 mmol/L (ref 3.5–5.1)
Sodium: 137 mmol/L (ref 135–145)
Total Bilirubin: 0.6 mg/dL (ref 0.3–1.2)
Total Protein: 6.3 g/dL — ABNORMAL LOW (ref 6.5–8.1)

## 2016-09-02 LAB — TSH: TSH: 1.357 u[IU]/mL (ref 0.350–4.500)

## 2016-09-02 LAB — I-STAT TROPONIN, ED: Troponin i, poc: 0 ng/mL (ref 0.00–0.08)

## 2016-09-02 LAB — TROPONIN I
Troponin I: <0.03 ng/mL (ref <0.03)
Troponin I: <0.03 ng/mL (ref <0.03)

## 2016-09-02 LAB — PROTIME-INR
INR: 1.05
Prothrombin Time: 13.7 seconds (ref 11.4–15.2)

## 2016-09-02 LAB — HEPARIN LEVEL (UNFRACTIONATED): Heparin Unfractionated: 0.37 IU/mL (ref 0.30–0.70)

## 2016-09-02 MED ORDER — FLUOXETINE HCL 20 MG PO CAPS
20.0000 mg | ORAL_CAPSULE | Freq: Every day | ORAL | Status: DC
  Administered 2016-09-02: 20 mg via ORAL
  Filled 2016-09-02: qty 1

## 2016-09-02 MED ORDER — ATORVASTATIN CALCIUM 20 MG PO TABS
20.0000 mg | ORAL_TABLET | Freq: Every day | ORAL | Status: DC
  Administered 2016-09-02: 20 mg via ORAL
  Filled 2016-09-02: qty 1

## 2016-09-02 MED ORDER — ASPIRIN EC 81 MG PO TBEC
81.0000 mg | DELAYED_RELEASE_TABLET | Freq: Every day | ORAL | Status: DC
  Administered 2016-09-02: 81 mg via ORAL
  Filled 2016-09-02: qty 1

## 2016-09-02 MED ORDER — SODIUM CHLORIDE 0.9 % WEIGHT BASED INFUSION
1.0000 mL/kg/h | INTRAVENOUS | Status: DC
  Administered 2016-09-03: 250 mL via INTRAVENOUS

## 2016-09-02 MED ORDER — PANTOPRAZOLE SODIUM 40 MG PO TBEC
40.0000 mg | DELAYED_RELEASE_TABLET | Freq: Every day | ORAL | Status: DC
Start: 2016-09-02 — End: 2016-09-03
  Administered 2016-09-02: 40 mg via ORAL
  Filled 2016-09-02: qty 1

## 2016-09-02 MED ORDER — SODIUM CHLORIDE 0.9% FLUSH: 3.0000 mL | INTRAVENOUS | Status: DC | PRN

## 2016-09-02 MED ORDER — EZETIMIBE 10 MG PO TABS
10.0000 mg | ORAL_TABLET | Freq: Every day | ORAL | Status: DC
  Administered 2016-09-02: 10 mg via ORAL
  Filled 2016-09-02: qty 1

## 2016-09-02 MED ORDER — TRAMADOL HCL 50 MG PO TABS
50.0000 mg | ORAL_TABLET | Freq: Four times a day (QID) | ORAL | Status: DC | PRN
Start: 2016-09-02 — End: 2016-09-03

## 2016-09-02 MED ORDER — NITROGLYCERIN 0.4 MG SL SUBL: 0.4000 mg | SUBLINGUAL_TABLET | SUBLINGUAL | Status: DC | PRN

## 2016-09-02 MED ORDER — AMLODIPINE BESYLATE 5 MG PO TABS
5.0000 mg | ORAL_TABLET | Freq: Every day | ORAL | Status: DC
  Administered 2016-09-02: 5 mg via ORAL
  Filled 2016-09-02: qty 1

## 2016-09-02 MED ORDER — ONDANSETRON HCL 4 MG/2ML IJ SOLN: 4.0000 mg | Freq: Four times a day (QID) | INTRAMUSCULAR | Status: DC | PRN

## 2016-09-02 MED ORDER — SODIUM CHLORIDE 0.9% FLUSH
3.0000 mL | Freq: Two times a day (BID) | INTRAVENOUS | Status: DC
  Administered 2016-09-02: 3 mL via INTRAVENOUS

## 2016-09-02 MED ORDER — LAMOTRIGINE 100 MG PO TABS
50.0000 mg | ORAL_TABLET | Freq: Two times a day (BID) | ORAL | Status: DC
  Administered 2016-09-02: 50 mg via ORAL
  Filled 2016-09-02 (×2): qty 1

## 2016-09-02 MED ORDER — SODIUM CHLORIDE 0.9 % WEIGHT BASED INFUSION
3.0000 mL/kg/h | INTRAVENOUS | Status: AC
  Administered 2016-09-03: 3 mL/kg/h via INTRAVENOUS

## 2016-09-02 MED ORDER — RANOLAZINE ER 500 MG PO TB12
500.0000 mg | ORAL_TABLET | Freq: Two times a day (BID) | ORAL | Status: DC
  Administered 2016-09-02 – 2016-09-03 (×2): 500 mg via ORAL
  Filled 2016-09-02 (×2): qty 1

## 2016-09-02 MED ORDER — HEPARIN BOLUS VIA INFUSION
2500.0000 [IU] | Freq: Once | INTRAVENOUS | Status: AC
  Administered 2016-09-02: 2500 [IU] via INTRAVENOUS
  Filled 2016-09-02: qty 2500

## 2016-09-02 MED ORDER — ASPIRIN 81 MG PO CHEW
81.0000 mg | CHEWABLE_TABLET | ORAL | Status: AC
  Administered 2016-09-03: 81 mg via ORAL
  Filled 2016-09-02: qty 1

## 2016-09-02 MED ORDER — ZOLPIDEM TARTRATE 5 MG PO TABS
5.0000 mg | ORAL_TABLET | Freq: Every evening | ORAL | Status: DC | PRN
  Administered 2016-09-02: 5 mg via ORAL
  Filled 2016-09-02: qty 1

## 2016-09-02 MED ORDER — CLOPIDOGREL BISULFATE 75 MG PO TABS
75.0000 mg | ORAL_TABLET | Freq: Every day | ORAL | Status: DC
Start: 2016-09-03 — End: 2016-09-03
  Administered 2016-09-02: 75 mg via ORAL
  Filled 2016-09-02: qty 1

## 2016-09-02 MED ORDER — SODIUM CHLORIDE 0.9 % IV SOLN: 250.0000 mL | INTRAVENOUS | Status: DC | PRN

## 2016-09-02 MED ORDER — ACETAMINOPHEN 325 MG PO TABS: 650.0000 mg | ORAL_TABLET | ORAL | Status: DC | PRN

## 2016-09-02 MED ORDER — HEPARIN (PORCINE) IN NACL 100-0.45 UNIT/ML-% IJ SOLN
700.0000 [IU]/h | INTRAMUSCULAR | Status: DC
  Administered 2016-09-02: 600 [IU]/h via INTRAVENOUS
  Filled 2016-09-02: qty 250

## 2016-09-02 MED ORDER — FLUOXETINE HCL 20 MG PO TABS
20.0000 mg | ORAL_TABLET | Freq: Every day | ORAL | Status: DC
  Filled 2016-09-02: qty 1

## 2016-09-02 MED ORDER — PREGABALIN 50 MG PO CAPS
50.0000 mg | ORAL_CAPSULE | Freq: Two times a day (BID) | ORAL | Status: DC
  Administered 2016-09-02 – 2016-09-03 (×2): 50 mg via ORAL
  Filled 2016-09-02 (×2): qty 1

## 2016-09-02 NOTE — H&P (Signed)
Cardiology H&P   Date:  09/02/2016  ID:  SINDEL LEY, DOB 1956/09/05, MRN [id number] PCP:  No PCP Per Patient  Cardiologist:  Dr. Percival Spanish  Chief Complaint: chest pain  History of Present Illness:  Crystal Clark is a 60 y.o. female with history of CAD (s/p PCI to LAD 2006 in [place], unchanged cath 2013, PTCA/DES to distal LAD stent ISR in 2015), multiple sclerosis, hyperlipidemia, tobacco abuse, GERD, Raynaud's disease who presents back to clinic for evaluation of chest pain. Last cath was at time of PTCA as above with widely patent LCx and RCA and EF of 70%. No labs available since 2015. Has been intolerant of Imdur due to interaction with MS meds and can not take BB due to Raynaud's disease. 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  She presents back to clinic today for evaluation of chest discomfort. She has been managed in the past with Ranexa for microvascular disease, but her symptoms have gotten significantly worse over the last 6 days. On Thursday evening she began to notice chest pain any time she exerted herself. She felt it while feeding the horses and lifting hay, as well as cleaning up after the puppies at her home. (Her daughter rescues puppies for the [facility].) This was associated with radiation to her jaw/arm and SOB. She also has had nocturnal angina associated with vomiting. She has had rare episodes of discomfort at rest. She feels a general sense of fatigue which is similar to prior angina. She is presently chest pain free.   Of note the patient requested refills on several of her cardiac meds - we did not prescribe today as some may change in the hospital based on findings. This will need to be addressed at discharge.   Past Medical History:  Diagnosis Date  . Anemia   . Chronic mid back pain    "T7-8 herniated  disc that cannot be repaired"  . Coronary artery disease    a) s/p stent to LAD in 2006 in [place], [place]. b) unchanged cath 2013. c) tandem 70% lesions distal LAD with ISR s/p balloon angio and DES 07/2014.  Marland Kitchen Dyslipidemia   . Essential hypertension   . GERD (gastroesophageal reflux disease)   . History of blood transfusion 1959  . MS (multiple sclerosis) ([place])    a) since 1997. b) Imdur with medication interactions.  . Raynaud's disease dx'd 2005   bb contraindicated  . Tobacco use    30+ pack years    Past Surgical History:  Procedure Laterality Date  . CARDIAC CATHETERIZATION  12/2011  . CESAREAN SECTION  X 2  . CORONARY ANGIOPLASTY WITH STENT PLACEMENT  03/2005  . CORONARY ANGIOPLASTY WITH STENT PLACEMENT  07/18/2014   ISRS- Mid LAD: Promus Premier 2.75 x 16 mm DES   . KNEE ARTHROSCOPY Left   . LEFT HEART CATHETERIZATION WITH CORONARY ANGIOGRAM N/A 12/23/2011   Procedure: LEFT HEART CATHETERIZATION WITH CORONARY ANGIOGRAM;  Surgeon: Wellington Hampshire, MD;  Location: [facility] CATH LAB;  Service: Cardiovascular;  Laterality: N/A;  . LEFT HEART CATHETERIZATION WITH CORONARY ANGIOGRAM N/A 07/18/2014   Procedure: LEFT HEART CATHETERIZATION WITH CORONARY ANGIOGRAM;  Surgeon: Sinclair Grooms, MD;  Location: [facility] CATH LAB;  Service: Cardiovascular;  Laterality: N/A;    Current Medications: No current facility-administered medications for this encounter.  Current Outpatient Prescriptions  Medication Sig Dispense Refill  . amLODipine (NORVASC) 5 MG tablet TAKE 1 TABLET (5 MG TOTAL) BY MOUTH DAILY. 30 tablet 0  . aspirin EC 81 MG tablet Take 81 mg by mouth daily.      Marland Kitchen atorvastatin (LIPITOR) 20 MG tablet TAKE 1 TABLET (20 MG TOTAL) BY MOUTH DAILY. 30 tablet 10  . ciprofloxacin (CILOXAN) 0.3 % ophthalmic solution Place 1 drop into the left eye every 2 (two) hours. Administer 1 drop, every 2 hours, while awake, for 2 days. Then 1 drop, every 4 hours, while awake, for the next 5 days. 5 mL 1  .  clopidogrel (PLAVIX) 75 MG tablet TAKE 1 TABLET (75 MG TOTAL) BY MOUTH DAILY. 30 tablet 0  . Cyanocobalamin (VITAMIN B-12 IJ) Inject as directed once a week. Last given over 2 weeks ago from today (8-11-5)    . FLUoxetine (PROZAC) 20 MG tablet Take 20 mg by mouth daily.     Marland Kitchen LAMOTRIGINE PO Take 1 tablet by mouth 2 (two) times daily.    Marland Kitchen lidocaine (LIDODERM) 5 % Place 2 patches onto the skin daily as needed. For pain Remove & Discard patch within 12 hours or as directed by MD     . natalizumab (TYSABRI) 300 MG/15ML injection Inject 300 mg into the vein every 30 (thirty) days. Last given seven weeks ago from today(07-25-15)    . nitroGLYCERIN (NITROSTAT) 0.4 MG SL tablet Place 1 tablet (0.4 mg total) under the tongue every 5 (five) minutes as needed for chest pain. 25 tablet 3  . omeprazole (PRILOSEC) 20 MG capsule TAKE ONE CAPSULE BY MOUTH EVERY DAY 30 capsule 10  . omeprazole (PRILOSEC) 20 MG capsule TAKE 1 CAPSULE (20 MG TOTAL) BY MOUTH DAILY. 30 capsule 10  . Pregabalin (LYRICA PO) Take 1 capsule by mouth 2 (two) times daily.    Marland Kitchen RANEXA 500 MG 12 hr tablet TAKE 1 TABLET (500 MG TOTAL) BY MOUTH 2 (TWO) TIMES DAILY. 60 tablet 10  . traMADol (ULTRAM) 50 MG tablet Take 50 mg by mouth every 6 (six) hours as needed for moderate pain.     . Vitamin D, Ergocalciferol, (DRISDOL) 50000 UNITS CAPS capsule Take 50,000 Units by mouth once a week.     Marland Kitchen ZETIA 10 MG tablet TAKE 1 TABLET (10 MG TOTAL) BY MOUTH DAILY. 30 tablet 0  . zolpidem (AMBIEN CR) 6.25 MG CR tablet Take 6.25 mg by mouth at bedtime.        Allergies:   Morphine and related and Simvastatin   Social History   Social History  . Marital status: Married    Spouse name: N/A  . Number of children: N/A  . Years of education: N/A   Occupational History  . 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 Smoking status: Current Every Day Smoker    Packs/day: 0.50    Years: 32.00    Types: Cigars, Cigarettes  .  Smokeless tobacco: Never Used  . Alcohol use No  . Drug use: No  . Sexual activity: Not on file   Other Topics Concern  . Not on file   Social History Narrative  . No narrative on file     Family History:  The patient's family history includes Alzheimer's disease in her mother; Colon polyps in her mother; Coronary artery disease in her father; Hypertension in her mother.   ROS:   Please see the  history of present illness.  No bleeding. All other systems are reviewed and otherwise negative.    PHYSICAL EXAM:   VS:  There were no vitals taken for this visit.  BMI: There is no height or weight on file to calculate BMI. GEN: Well nourished, well developed WF, in no acute distress  HEENT: normocephalic, atraumatic Neck: no JVD, carotid bruits, or masses Cardiac: RRR; no murmurs, rubs, or gallops, no edema  Respiratory:  clear to auscultation bilaterally, normal work of breathing GI: soft, nontender, nondistended, + BS MS: no deformity or atrophy  Skin: warm and dry, no rash Neuro:  Alert and Oriented x 3, Strength and sensation are intact, follows commands Psych: euthymic mood, full affect  Wt Readings from Last 3 Encounters:  09/02/16 102 lb 6.4 oz (46.4 kg)  04/17/16 103 lb (46.7 kg)  07/19/14 101 lb 6.6 oz (46 kg)      Studies/Labs Reviewed:   EKG:  EKG was ordered today and personally reviewed by me and demonstrates NSR 67bpm no acute changes, QTc 480ms  Recent Labs: No results found for requested labs within last 8760 hours.   Lipid Panel    Component Value Date/Time   CHOL 206 (H) 07/11/2014 1223   TRIG 129 07/11/2014 1223   HDL 88 07/11/2014 1223   CHOLHDL 2.3 07/11/2014 1223   VLDL 26 07/11/2014 1223   LDLCALC 92 07/11/2014 1223    Additional studies/ records that were reviewed today include: Summarized above.    ASSESSMENT & PLAN:   1. Chest pain concerning for unstable angina - patient seen/examined with Dr. Meda Coffee. Her symptoms are worrisome.  She's begun to have some discomfort at rest as well. Will plan to admit, check labs, start on IV heparin. Cath board is full today so we will let her eat and plan on cath tomorrow with Dr. Ellyn Hack at noon. Risks and benefits of cardiac catheterization have been discussed with the patient.  These include bleeding, infection, kidney damage, stroke, heart attack, death.  The patient understands these risks and is willing to proceed. Will not start any IV NTG due to h/o interaction between Imdur and her MS medications. I spoke with Bernerd Pho PA-C who is covering for our hospital team leader Birdie Sons today to make her aware. There are no beds available for direct admission so we will have to send her to the ER. I will write her admission orders upon her arrival. I also spoke with ER triage to make them aware. 2. CAD - as above. Continue ASA, Plavix, statin. Not on BB due to Raynaud's disease. Will need to d/c omeprazole and start Protonix given Plavix interaction. If restenosis is found on cath would consider P2Y12 testing. Of note the patient requested refills on several of her cardiac meds - we did not prescribe today as some may change in the hospital based on findings. This will need to be addressed at discharge. Will also ask pharmacy to help clarify some of her home meds that are missing doses. 3. MS - on Tysabri as outpatient. 4. Hyperlipidemia - continue statin. She's had issues with MS with simvastatin in the past so I will not titrate dose today. 5. Tobacco abuse - she acknowledges the need to quit. Cessation advised.  Disposition: F/u TBD post-cath.   Medication Adjustments/Labs and Tests Ordered: Current medicines are reviewed at length with the patient today.  Concerns regarding medicines are outlined above. Medication changes, Labs and Tests ordered today are summarized above and listed  in the Patient Instructions accessible in Encounters.   Raechel Ache PA-C  09/02/2016 1:02 PM     [facility] [place], [place], [place]  [address] Phone: [phone]; Fax: [phone]   The patient was seen, examined and discussed with Melina Copa, PA-C and I agree with the above.   This is a very pleasant 60 year old female with past medical history of CAD (s/p PCI to LAD 2006 in [place], unchanged cath 2013, PTCA/DES to distal LAD stent ISR in 2015), multiple sclerosis, hyperlipidemia, ongoing tobacco abuse, GERD, Raynaud's disease who presented today with symptoms that are highly suspicious of unstable angina. The patient has been experiencing progressively worsening exertional chest pain that resolves addressed, last night she woke up with resting chest pain typical in character with her angina. The patient states that her pain is associated with nausea vomiting radiation to her jaw and arm and shortness of breath.  We have decided to admit her to the hospital for cardiac catheterization tomorrow.  Ena Dawley 09/02/2016

## 2016-09-02 NOTE — ED Provider Notes (Signed)
[facility] DEPT Provider Note   CSN: [id number] Arrival date & time: 09/02/16  1302     History   Chief Complaint Chief Complaint  Patient presents with  . Chest Pain  . Fatigue    HPI Crystal Clark is a 60 y.o. female.  HPI  Pt with hx of CAD with stents, MS presents with c/o chest pain.  Symptoms started approx 5 days ago- she was seen by cardiology in office today and they wanted to direct admit her from there but there were no beds.  She denies current chest pain.  She states the pain 5 days ago awoke her from sleep, described as heavines.  She took nitroglycerin x 3 with relief of pain at that time.  She has been having chest pain and heaviness each day since.  No fever, She has had mild cough.  She is a current smoker.  No fever.  No leg swelling.  No palpitations or syncope.  There are no other associated systemic symptoms, there are no other alleviating or modifying factors.   Past Medical History:  Diagnosis Date  . Anemia   . Chronic mid back pain    "T7-8 herniated disc that cannot be repaired"  . Coronary artery disease    a) s/p stent to LAD in 2006 in [place], [place]. b) unchanged cath 2013. c) tandem 70% lesions distal LAD with ISR s/p balloon angio and DES 07/2014. d) stable cath 08/2016 without obstructive lesion.  . Dyslipidemia   . GERD (gastroesophageal reflux disease)   . History of blood transfusion 1959  . MS (multiple sclerosis) ([place])    a) since 1997. b) cannot take Imdur due to medication interactions.  . Raynaud's disease dx'd 2005   bb contraindicated  . Tobacco use    30+ pack years    Patient Active Problem List   Diagnosis Date Noted  . Raynaud's disease 09/03/2016  . Tobacco abuse 09/03/2016  . Angina pectoris ([place]) 09/02/2016  . CAD S/P percutaneous coronary angioplasty   . MS (multiple sclerosis) ([place])   . Hyperlipidemia 12/23/2011    Past Surgical History:  Procedure Laterality Date  . CARDIAC CATHETERIZATION  12/2011  .  CARDIAC CATHETERIZATION N/A 09/03/2016   Procedure: Left Heart Cath and Coronary Angiography;  Surgeon: Leonie Man, MD;  Location: [facility];  Service: Cardiovascular;  Laterality: N/A;  . CESAREAN SECTION  X 2  . CORONARY ANGIOPLASTY WITH STENT PLACEMENT  03/2005  . CORONARY ANGIOPLASTY WITH STENT PLACEMENT  07/18/2014   ISRS- Mid LAD: Promus Premier 2.75 x 16 mm DES   . KNEE ARTHROSCOPY Left   . LEFT HEART CATHETERIZATION WITH CORONARY ANGIOGRAM N/A 12/23/2011   Procedure: LEFT HEART CATHETERIZATION WITH CORONARY ANGIOGRAM;  Surgeon: Wellington Hampshire, MD;  Location: [facility]ATH [facility];  Service: Cardiovascular;  Laterality: N/A;  . LEFT HEART CATHETERIZATION WITH CORONARY ANGIOGRAM N/A 07/18/2014   Procedure: LEFT HEART CATHETERIZATION WITH CORONARY ANGIOGRAM;  Surgeon: Sinclair Grooms, MD;  Location: [facility] CATH LAB;  Service: Cardiovascular;  Laterality: N/A;    OB History    No data available       Home Medications    Prior to Admission medications   Medication Sig Start Date End Date Taking? Authorizing Provider  aspirin EC 81 MG tablet Take 81 mg by mouth daily.     Yes Historical Provider, MD  FLUoxetine (PROZAC) 20 MG tablet Take 20 mg by mouth daily.  05/13/14  Yes Historical  Provider, MD  lamoTRIgine (LAMICTAL) 25 MG tablet Take 50 mg by mouth 2 (two) times daily.   Yes Historical Provider, MD  lidocaine (LIDODERM) 5 % Place 2 patches onto the skin daily as needed. For pain Remove & Discard patch within 12 hours or as directed by MD    Yes Historical Provider, MD  natalizumab (TYSABRI) 300 MG/15ML injection Inject 300 mg into the vein every 30 (thirty) days. Last given seven weeks ago from today(07-25-15)   Yes Historical Provider, MD  pregabalin (LYRICA) 50 MG capsule Take 50 mg by mouth 2 (two) times daily.   Yes Historical Provider, MD  traMADol (ULTRAM) 50 MG tablet Take 50 mg by mouth every 6 (six) hours as needed for moderate pain.    Yes Historical Provider, MD  Vitamin D,  Ergocalciferol, (DRISDOL) 50000 UNITS CAPS capsule Take 50,000 Units by mouth every Saturday.  05/13/14  Yes Historical Provider, MD  zolpidem (AMBIEN) 10 MG tablet Take 10 mg by mouth at bedtime.   Yes Historical Provider, MD  amLODipine (NORVASC) 5 MG tablet Take 1 tablet (5 mg total) by mouth daily. 09/03/16   Dayna N Dunn, PA-C  atorvastatin (LIPITOR) 20 MG tablet Take 1 tablet (20 mg total) by mouth every evening. 09/03/16   Dayna N Dunn, PA-C  clopidogrel (PLAVIX) 75 MG tablet Take 1 tablet (75 mg total) by mouth daily. 09/03/16   Dayna N Dunn, PA-C  ezetimibe (ZETIA) 10 MG tablet Take 1 tablet (10 mg total) by mouth daily. 09/03/16   Dayna N Dunn, PA-C  nitroGLYCERIN (NITROSTAT) 0.4 MG SL tablet Place 1 tablet (0.4 mg total) under the tongue every 5 (five) minutes as needed for chest pain. Up to 3 doses 09/03/16   Dayna N Dunn, PA-C  pantoprazole (PROTONIX) 40 MG tablet Take 1 tablet (40 mg total) by mouth daily. 09/03/16   Dayna N Dunn, PA-C  ranolazine (RANEXA) 500 MG 12 hr tablet Take 1 tablet (500 mg total) by mouth 2 (two) times daily. 09/03/16   Dayna N Dunn, PA-C    Family History Family History  Problem Relation Age of Onset  . Colon polyps Mother   . Alzheimer's disease Mother   . Hypertension Mother   . Coronary artery disease Father     s/p CABG at 53    Social History Social History  Substance Use Topics  . Smoking status: Current Every Day Smoker    Packs/day: 0.50    Years: 32.00    Types: Cigars, Cigarettes  . Smokeless tobacco: Never Used  . Alcohol use No     Allergies   Morphine and related and Simvastatin   Review of Systems Review of Systems ROS reviewed and all otherwise negative except for mentioned in HPI  Physical Exam Updated Vital Signs BP 118/61   Pulse 77   Temp 98.1 F (36.7 C) (Oral)   Resp (!) 21   Ht 5\' 2"  (1.575 m)   Wt 45.3 kg   SpO2 100%   BMI 18.25 kg/m  Vitals reviewed Physical Exam Physical Examination: General appearance -  alert, well appearing, and in no distress Mental status - no conjunctival injection no scleral icterus Eyes - no conjunctival injection no scleral icterus Chest - clear to auscultation, no wheezes, rales or rhonchi, symmetric air entry Heart - normal rate, regular rhythm, normal S1, S2, no murmurs, rubs, clicks or gallops Neurolgiical - alert, oriented, normal speech Extremities - peripheral pulses normal, no pedal edema, no clubbing or cyanosis  Skin - normal coloration and turgor, no rashes  ED Treatments / Results  Labs (all labs ordered are listed, but only abnormal results are displayed) Labs Reviewed  COMPREHENSIVE METABOLIC PANEL - Abnormal; Notable for the following:       Result Value   BUN <5 (*)    Total Protein 6.3 (*)    All other components within normal limits  HEPARIN LEVEL (UNFRACTIONATED) - Abnormal; Notable for the following:    Heparin Unfractionated 0.26 (*)    All other components within normal limits  CBC - Abnormal; Notable for the following:    RBC 3.82 (*)    Hemoglobin 11.5 (*)    HCT 34.7 (*)    All other components within normal limits  BASIC METABOLIC PANEL - Abnormal; Notable for the following:    BUN <5 (*)    Calcium 8.6 (*)    All other components within normal limits  LIPID PANEL - Abnormal; Notable for the following:    LDL Cholesterol 111 (*)    All other components within normal limits  CBC WITH DIFFERENTIAL/PLATELET  PROTIME-INR  TROPONIN I  TROPONIN I  TROPONIN I  TSH  HEPARIN LEVEL (UNFRACTIONATED)  I-STAT TROPOININ, ED    EKG  EKG Interpretation  Date/Time:  Wednesday September 02 2016 13:09:46 EDT Ventricular Rate:  71 PR Interval:  124 QRS Duration: 124 QT Interval:  414 QTC Calculation: 449 R Axis:   90 Text Interpretation:  Normal sinus rhythm Rightward axis Non-specific intra-ventricular conduction delay Nonspecific ST and T wave abnormality Abnormal ECG baseline artifact but does not appear to have acute changes  compared to prior Confirmed by [facility]  MD, Dazja Houchin [phone]) on 09/02/2016 4:58:45 PM       Radiology Dg Chest 2 View  Result Date: 09/02/2016 CLINICAL DATA:  Chronic cough for months, chest pain for about 1 week EXAM: CHEST  2 VIEW COMPARISON:  07/24/2014 FINDINGS: Cardiomediastinal silhouette is unremarkable. No acute infiltrate or pleural effusion. No pulmonary edema. Osteopenia and mild degenerative changes mid thoracic spine IMPRESSION: No active cardiopulmonary disease. Electronically Signed   By: Lahoma Crocker M.D.   On: 09/02/2016 13:58    Procedures Procedures (including critical care time)  Medications Ordered in ED Medications  amLODipine (NORVASC) tablet 5 mg ( Oral MAR Unhold 09/03/16 1032)  clopidogrel (PLAVIX) tablet 75 mg ( Oral MAR Unhold 09/03/16 1032)  ezetimibe (ZETIA) tablet 10 mg ( Oral MAR Unhold 09/03/16 1032)  atorvastatin (LIPITOR) tablet 20 mg ( Oral MAR Unhold 09/03/16 1032)  pantoprazole (PROTONIX) EC tablet 40 mg ( Oral MAR Unhold 09/03/16 1032)  ranolazine (RANEXA) 12 hr tablet 500 mg ( Oral MAR Unhold 09/03/16 1032)  zolpidem (AMBIEN) tablet 5 mg ( Oral MAR Unhold 09/03/16 1032)  aspirin EC tablet 81 mg ( Oral MAR Unhold 09/03/16 1032)  nitroGLYCERIN (NITROSTAT) SL tablet 0.4 mg ( Sublingual MAR Unhold 09/03/16 1032)  traMADol (ULTRAM) tablet 50 mg ( Oral MAR Unhold 09/03/16 1032)  acetaminophen (TYLENOL) tablet 650 mg ( Oral MAR Unhold 09/03/16 1032)  ondansetron (ZOFRAN) injection 4 mg ( Intravenous MAR Unhold 09/03/16 1032)  sodium chloride flush (NS) 0.9 % injection 3 mL ( Intravenous MAR Unhold 09/03/16 1032)  sodium chloride flush (NS) 0.9 % injection 3 mL ( Intravenous MAR Unhold 09/03/16 1032)  0.9 %  sodium chloride infusion ( Intravenous MAR Unhold 09/03/16 1032)  0.9% sodium chloride infusion (3 mL/kg/hr  46.4 kg Intravenous New Bag/Given 09/03/16 0612)  heparin ADULT infusion 100 units/mL (25000  units/236mL sodium chloride 0.45%) (700 Units/hr Intravenous  Rate/Dose Change 09/03/16 0550)  lamoTRIgine (LAMICTAL) tablet 50 mg ( Oral MAR Unhold 09/03/16 1032)  pregabalin (LYRICA) capsule 50 mg ( Oral MAR Unhold 09/03/16 1032)  FLUoxetine (PROZAC) capsule 20 mg ( Oral MAR Unhold 09/03/16 1032)  aspirin chewable tablet 81 mg (81 mg Oral Given 09/03/16 0612)  heparin bolus via infusion 2,500 Units (2,500 Units Intravenous Bolus from Bag 09/02/16 1712)     Initial Impression / Assessment and Plan / ED Course  I have reviewed the triage vital signs and the nursing notes.  Pertinent labs & imaging results that were available during my care of the patient were reviewed by me and considered in my medical decision making (see chart for details).  Clinical Course    Pt presenting for admission from cardiology clinic.  Pt started on heparin and cardiology is planning on doing cath during hospitalization.  During ED stay patient remains chest pain free.    Final Clinical Impressions(s) / ED Diagnoses   Final diagnoses:  Ischemic chest pain [facility])    New Prescriptions Discharge Medication List as of 09/03/2016  1:38 PM    START taking these medications   Details  pantoprazole (PROTONIX) 40 MG tablet Take 1 tablet (40 mg total) by mouth daily., Starting Thu 09/03/2016, Normal         Alfonzo Beers, MD 09/03/16 1517

## 2016-09-02 NOTE — Progress Notes (Signed)
I clarified with patient that lamotrigene dose is 25mg  2 tabs BID and lyrica is 50mg  BID. Wake Conlee PA-C

## 2016-09-02 NOTE — Progress Notes (Signed)
ANTICOAGULATION CONSULT NOTE - Initial Consult  Pharmacy Consult for heparin  Indication: chest pain/ACS  Allergies  Allergen Reactions  . Morphine And Related Other (See Comments)    Pt states "It increases my angina"  . Simvastatin Other (See Comments)    "makes MS worse"    Patient Measurements:   Heparin Dosing Weight: 46.4 kg  Vital Signs: BP: 114/72 (09/20 1140) Pulse Rate: 70 (09/20 1140)  Labs: No results for input(s): HGB, HCT, PLT, APTT, LABPROT, INR, HEPARINUNFRC, HEPRLOWMOCWT, CREATININE, CKTOTAL, CKMB, TROPONINI in the last 72 hours.  CrCl cannot be calculated (Patient's most recent lab result is older than the maximum 21 days allowed.).   Medical History: Past Medical History:  Diagnosis Date  . Anemia   . Chronic mid back pain    "T7-8 herniated disc that cannot be repaired"  . Coronary artery disease    a) s/p stent to LAD in 2006 in [place], [place]. b) unchanged cath 2013. c) tandem 70% lesions distal LAD with ISR s/p balloon angio and DES 07/2014.  Marland Kitchen Dyslipidemia   . GERD (gastroesophageal reflux disease)   . History of blood transfusion 1959  . MS (multiple sclerosis) ([place])    a) since 1997. b) Imdur with medication interactions.  . Raynaud's disease dx'd 2005   bb contraindicated  . Tobacco use    30+ pack years    Medications:  See EMR  Assessment: 60 yo female with hx of CAD s/p PCI in 2006 and 2015. Admitted with worsening chest pain and vomiting. To start heparin gtt for ACS and planning for cardiac cath in the morning. No anticoagulants prior to admission.    Goal of Therapy:  Heparin level 0.3-0.7 units/ml Monitor platelets by anticoagulation protocol: Yes    Plan:  -Heparin bolus 2500 units then 600 units/hr -Daily HL, CBC -First level this evening -Cath tomorrow morning   Harvel Quale 09/02/2016,1:56 PM

## 2016-09-02 NOTE — ED Triage Notes (Signed)
Pt reports Cp Thursday night, woke from a sleep. Described as heaviness. Pt reports vomiting with the pain, SL nitro x3 with relief of pain. Pt reports daily chest pain and heaviness over the weekend as well as fatigue and some SOB. Pt current smoker, with cough, denies recent fever. VSS. Pt with hx of stent and occlusion.

## 2016-09-03 ENCOUNTER — Ambulatory Visit (HOSPITAL_COMMUNITY): Admission: RE | Admit: 2016-09-03 | Payer: PRIVATE HEALTH INSURANCE | Source: Ambulatory Visit | Admitting: Cardiology

## 2016-09-03 ENCOUNTER — Ambulatory Visit: Payer: Managed Care, Other (non HMO) | Admitting: Nurse Practitioner

## 2016-09-03 ENCOUNTER — Encounter (HOSPITAL_COMMUNITY): Payer: Self-pay | Admitting: Cardiology

## 2016-09-03 ENCOUNTER — Encounter (HOSPITAL_COMMUNITY)
Admission: EM | Disposition: A | Discharge status: Home / Self Care | Payer: Self-pay | Source: Home / Self Care | Attending: Emergency Medicine

## 2016-09-03 DIAGNOSIS — I25110 Atherosclerotic heart disease of native coronary artery with unstable angina pectoris: Principal | ICD-10-CM

## 2016-09-03 DIAGNOSIS — I2510 Atherosclerotic heart disease of native coronary artery without angina pectoris: Secondary | ICD-10-CM | POA: Diagnosis not present

## 2016-09-03 DIAGNOSIS — I209 Angina pectoris, unspecified: Secondary | ICD-10-CM | POA: Diagnosis not present

## 2016-09-03 DIAGNOSIS — G35 Multiple sclerosis: Secondary | ICD-10-CM | POA: Diagnosis not present

## 2016-09-03 DIAGNOSIS — Z720 Tobacco use: Secondary | ICD-10-CM

## 2016-09-03 DIAGNOSIS — I7300 Raynaud's syndrome without gangrene: Secondary | ICD-10-CM

## 2016-09-03 DIAGNOSIS — Z9861 Coronary angioplasty status: Secondary | ICD-10-CM

## 2016-09-03 HISTORY — PX: CARDIAC CATHETERIZATION: SHX172

## 2016-09-03 LAB — BASIC METABOLIC PANEL
Anion gap: 6 (ref 5–15)
BUN: <5 mg/dL — ABNORMAL LOW (ref 6–20)
CO2: 27 mmol/L (ref 22–32)
Calcium: 8.6 mg/dL — ABNORMAL LOW (ref 8.9–10.3)
Chloride: 106 mmol/L (ref 101–111)
Creatinine, Ser: 0.68 mg/dL (ref 0.44–1.00)
GFR calc Af Amer: >60 mL/min (ref >60)
GFR calc non Af Amer: >60 mL/min (ref >60)
Glucose, Bld: 85 mg/dL (ref 65–99)
Potassium: 3.6 mmol/L (ref 3.5–5.1)
Sodium: 139 mmol/L (ref 135–145)

## 2016-09-03 LAB — CBC
HCT: 34.7 % — ABNORMAL LOW (ref 36.0–46.0)
Hemoglobin: 11.5 g/dL — ABNORMAL LOW (ref 12.0–15.0)
MCH: 30.1 pg (ref 26.0–34.0)
MCHC: 33.1 g/dL (ref 30.0–36.0)
MCV: 90.8 fL (ref 78.0–100.0)
Platelets: 282 10*3/uL (ref 150–400)
RBC: 3.82 MIL/uL — ABNORMAL LOW (ref 3.87–5.11)
RDW: 13.8 % (ref 11.5–15.5)
WBC: 7 10*3/uL (ref 4.0–10.5)

## 2016-09-03 LAB — LIPID PANEL
Cholesterol: 197 mg/dL (ref 0–200)
HDL: 75 mg/dL (ref >40)
LDL Cholesterol: 111 mg/dL — ABNORMAL HIGH (ref 0–99)
Total CHOL/HDL Ratio: 2.6 RATIO
Triglycerides: 54 mg/dL (ref <150)
VLDL: 11 mg/dL (ref 0–40)

## 2016-09-03 LAB — HEPARIN LEVEL (UNFRACTIONATED): Heparin Unfractionated: 0.26 IU/mL — ABNORMAL LOW (ref 0.30–0.70)

## 2016-09-03 LAB — TROPONIN I: Troponin I: <0.03 ng/mL (ref <0.03)

## 2016-09-03 SURGERY — LEFT HEART CATH AND CORONARY ANGIOGRAPHY

## 2016-09-03 MED ORDER — FENTANYL CITRATE (PF) 100 MCG/2ML IJ SOLN
INTRAMUSCULAR | Status: AC
  Filled 2016-09-03: qty 2

## 2016-09-03 MED ORDER — IOPAMIDOL (ISOVUE-370) INJECTION 76%
INTRAVENOUS | Status: DC | PRN
  Administered 2016-09-03: 65 mL via INTRA_ARTERIAL

## 2016-09-03 MED ORDER — IOPAMIDOL (ISOVUE-370) INJECTION 76%
INTRAVENOUS | Status: AC
  Filled 2016-09-03: qty 100

## 2016-09-03 MED ORDER — HEPARIN (PORCINE) IN NACL 2-0.9 UNIT/ML-% IJ SOLN
INTRAMUSCULAR | Status: DC | PRN
  Administered 2016-09-03: 1000 mL

## 2016-09-03 MED ORDER — FENTANYL CITRATE (PF) 100 MCG/2ML IJ SOLN
INTRAMUSCULAR | Status: DC | PRN
  Administered 2016-09-03: 25 ug via INTRAVENOUS

## 2016-09-03 MED ORDER — MIDAZOLAM HCL 2 MG/2ML IJ SOLN
INTRAMUSCULAR | Status: DC | PRN
  Administered 2016-09-03: 1 mg via INTRAVENOUS

## 2016-09-03 MED ORDER — ATORVASTATIN CALCIUM 20 MG PO TABS: 20.0000 mg | ORAL_TABLET | Freq: Every evening | ORAL | 6 refills | Status: DC

## 2016-09-03 MED ORDER — HEPARIN SODIUM (PORCINE) 1000 UNIT/ML IJ SOLN
INTRAMUSCULAR | Status: AC
  Filled 2016-09-03: qty 1

## 2016-09-03 MED ORDER — EZETIMIBE 10 MG PO TABS: 10.0000 mg | ORAL_TABLET | Freq: Every day | ORAL | 6 refills | Status: DC

## 2016-09-03 MED ORDER — NITROGLYCERIN 0.4 MG SL SUBL: 0.4000 mg | SUBLINGUAL_TABLET | SUBLINGUAL | 3 refills | Status: DC | PRN

## 2016-09-03 MED ORDER — CLOPIDOGREL BISULFATE 75 MG PO TABS: 75.0000 mg | ORAL_TABLET | Freq: Every day | ORAL | 6 refills | Status: DC

## 2016-09-03 MED ORDER — MIDAZOLAM HCL 2 MG/2ML IJ SOLN
INTRAMUSCULAR | Status: AC
  Filled 2016-09-03: qty 2

## 2016-09-03 MED ORDER — HEPARIN (PORCINE) IN NACL 2-0.9 UNIT/ML-% IJ SOLN
INTRAMUSCULAR | Status: AC
  Filled 2016-09-03: qty 1000

## 2016-09-03 MED ORDER — LIDOCAINE HCL (PF) 1 % IJ SOLN
INTRAMUSCULAR | Status: DC | PRN
  Administered 2016-09-03: 2 mL

## 2016-09-03 MED ORDER — VERAPAMIL HCL 2.5 MG/ML IV SOLN
INTRAVENOUS | Status: AC
  Filled 2016-09-03: qty 2

## 2016-09-03 MED ORDER — PANTOPRAZOLE SODIUM 40 MG PO TBEC: 40.0000 mg | DELAYED_RELEASE_TABLET | Freq: Every day | ORAL | 1 refills | Status: DC

## 2016-09-03 MED ORDER — AMLODIPINE BESYLATE 5 MG PO TABS: 5.0000 mg | ORAL_TABLET | Freq: Every day | ORAL | 6 refills | Status: DC

## 2016-09-03 MED ORDER — LIDOCAINE HCL (PF) 1 % IJ SOLN
INTRAMUSCULAR | Status: AC
  Filled 2016-09-03: qty 30

## 2016-09-03 MED ORDER — HEPARIN SODIUM (PORCINE) 1000 UNIT/ML IJ SOLN
INTRAMUSCULAR | Status: DC | PRN
  Administered 2016-09-03: 3000 [IU] via INTRAVENOUS

## 2016-09-03 MED ORDER — RANOLAZINE ER 500 MG PO TB12: 500.0000 mg | ORAL_TABLET | Freq: Two times a day (BID) | ORAL | 6 refills | Status: DC

## 2016-09-03 MED ORDER — VERAPAMIL HCL 2.5 MG/ML IV SOLN
INTRAVENOUS | Status: DC | PRN
  Administered 2016-09-03: 5 mL via INTRA_ARTERIAL

## 2016-09-03 SURGICAL SUPPLY — 11 items
CATH DIAG 6FR PIGTAIL ANGLED (CATHETERS) ×1 IMPLANT
CATH OPTITORQUE TIG 4.0 5F (CATHETERS) ×1 IMPLANT
DEVICE RAD COMP TR BAND LRG (VASCULAR PRODUCTS) ×1 IMPLANT
GLIDESHEATH SLEND A-KIT 6F 22G (SHEATH) ×1 IMPLANT
KIT HEART LEFT (KITS) ×2 IMPLANT
PACK CARDIAC CATHETERIZATION (CUSTOM PROCEDURE TRAY) ×2 IMPLANT
SYR MEDRAD MARK V 150ML (SYRINGE) ×2 IMPLANT
TRANSDUCER W/STOPCOCK (MISCELLANEOUS) ×2 IMPLANT
TUBING CIL FLEX 10 FLL-RA (TUBING) ×2 IMPLANT
WIRE HI TORQ VERSACORE-J 145CM (WIRE) ×1 IMPLANT
WIRE SAFE-T 1.5MM-J .035X260CM (WIRE) ×1 IMPLANT

## 2016-09-03 NOTE — Progress Notes (Signed)
ANTICOAGULATION CONSULT NOTE - Follow-up Consult  Pharmacy Consult for heparin  Indication: chest pain/ACS  Allergies  Allergen Reactions  . Morphine And Related Other (See Comments)    Pt states "It increases my angina"  . Simvastatin Other (See Comments)    "makes MS worse"    Patient Measurements: Height: 5\' 2"  (157.5 cm) Weight: 100 lb 6.4 oz (45.5 kg) IBW/kg (Calculated) : 50.1 Heparin Dosing Weight: 46.4 kg  Vital Signs: Temp: 98.5 F (36.9 C) (09/20 1945) Temp Source: Oral (09/20 1945) BP: 131/61 (09/20 1945) Pulse Rate: 66 (09/20 1945)  Labs:  Recent Labs  09/02/16 1337 09/02/16 1928 09/02/16 2229  HGB 12.8  --   --   HCT 38.9  --   --   PLT 307  --   --   LABPROT 13.7  --   --   INR 1.05  --   --   HEPARINUNFRC  --   --  0.37  CREATININE 0.61  --   --   TROPONINI <0.03 <0.03  --     Estimated Creatinine Clearance: 55.1 mL/min (by C-G formula based on SCr of 0.61 mg/dL).   Assessment: 60 yo female with hx of CAD s/p PCI in 2006 and 2015. Pt on heparin gtt for ACS. Heparin level therapeutic on 600 units/hr. No issues noted. Cath scheduled for 1200.  Goal of Therapy:  Heparin level 0.3-0.7 units/ml Monitor platelets by anticoagulation protocol: Yes   Plan:  -Continue heparin at 600 units/hr -F/u daily heparin level to confirm therapeutic  Sherlon Handing, PharmD, BCPS Clinical pharmacist, pager [phone] 09/03/2016,12:08 AM

## 2016-09-03 NOTE — Discharge Summary (Signed)
Discharge Summary    Patient ID: Crystal Clark,  MRN: [id number], DOB/AGE: September 28, 1956 60 y.o.  Admit date: 09/02/2016 Discharge date: 09/03/2016  Primary Care Provider: No PCP Per Patient Primary Cardiologist: Hochrein  Discharge Diagnoses    Principal Problem:   Angina pectoris ([place]) Active Problems:   Hyperlipidemia   CAD S/P percutaneous coronary angioplasty   MS (multiple sclerosis) ([facility])   Raynaud's disease   Tobacco abuse   Diagnostic Studies/Procedures    1. Cardiac catheterization this admission, please see full report and below for summary. Stable.  _____________   History of Present Illness & Hospital Course    CAD (s/p PCI to LAD 2006 in [place], unchanged cath 2013, PTCA/DES to distal LAD stent ISR in 2015, cath as below), multiple sclerosis, hyperlipidemia, tobacco abuse, GERD, Raynaud's disease (on amlodipine for this) who presented to clinic with complaints of chest pain. She been intolerant of Imdur due to interaction with MS meds and can not take BB due to Raynaud's disease. 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 She presented with symptoms of chest pain. For the past 6 days she began to notice chest pain occurred with exertion, at night, and then later at rest. One of the nocturnal episodes was associated with projectile vomiting. She felt it while feeding the horses and lifting hay, as well as cleaning up after the puppies at her home. (Her daughter rescues puppies for the [facility].) She felt generally fatigued which is what she felt with prior angina. However, her cardiac symptomatology has been difficult to sort out due to her concomitant multiple sclerosis. EKG was nonacute but the patient was certainly concerned about further blockage. She reported compliance with meds. She was admitted to the hospital and  placed on IV heparin for concern for unstable angina. She r/o for MI. LFTs nonacute. TSH wnl. Not tachycardic, tachypneic, or hypoxic. No signs of DVT on exam or recent immobility - Wells criteria low risk. CXR NAD. Underwent LHC today showing 10% stenosis in previously placed stents but essentially normal angiographically images with no culprit lesion to explain the patient's symptoms, normal LVEDP, normal LV function. Could consider possible Variant/Prinzmetal angina in a patient with Raynaud's. Her BP was too soft to consider titration of amlodipine. As above, she has not bee able to take Imdur due to interaction with MS medications. Will continue current regimen and consider titration of Ranexa in follow-up and refer to pulm for eval of possible COPD if she has recurrent symptoms. She ambulated post cath this afternoon without any recurrent chest pain or SOB. Pulse ox 100%. Telemetry stable. Smoking cessation was advised. Did not titrate up statin due to h/o simvastatin exacerbating her MS in the past. Dr. Ellyn Hack has seen and examined the patient today and feels she is stable for discharge.  She requested refills of her cardiac meds at dc. They were all previously prescribed in all capital letters with the disclaimer that she needed to call her doctor for refills. Upon renewing the rx I removed the excess disclaimer - so even though it looks like these doses were changed, the computer only thinks that due to the removal of the disclaimer. The only med change made was substitution of Protonix for Prilosec due to concomitant Plavix.  Consultants: N/A _____________  Discharge Vitals Blood pressure 118/61, pulse 77, temperature 98.1 F (36.7  C), temperature source Oral, resp. rate (!) 21, height 5\' 2"  (1.575 m), weight 99 lb 12.8 oz (45.3 kg), SpO2 100 %.  Filed Weights   09/02/16 1945 09/03/16 0400  Weight: 100 lb 6.4 oz (45.5 kg) 99 lb 12.8 oz (45.3 kg)    Labs & Radiologic Studies     CBC  Recent Labs  09/02/16 1337 09/03/16 0355  WBC 8.3 7.0  NEUTROABS 4.1  --   HGB 12.8 11.5*  HCT 38.9 34.7*  MCV 92.0 90.8  PLT 307 [id number]   Basic Metabolic Panel  Recent Labs  09/02/16 1337 09/03/16 0355  NA 137 139  K 4.1 3.6  CL 106 106  CO2 23 27  GLUCOSE 85 85  BUN <5* <5*  CREATININE 0.61 0.68  CALCIUM 9.1 8.6*   Liver Function Tests  Recent Labs  09/02/16 1337  AST 19  ALT 14  ALKPHOS 75  BILITOT 0.6  PROT 6.3*  ALBUMIN 4.2   Cardiac Enzymes  Recent Labs  09/02/16 1337 09/02/16 1928 09/03/16 0100  TROPONINI <0.03 <0.03 <0.03    Fasting Lipid Panel  Recent Labs  09/03/16 0355  CHOL 197  HDL 75  LDLCALC 111*  TRIG 54  CHOLHDL 2.6   Thyroid Function Tests  Recent Labs  09/02/16 1337  TSH 1.357   _____________  Dg Chest 2 View  Result Date: 09/02/2016 CLINICAL DATA:  Chronic cough for months, chest pain for about 1 week EXAM: CHEST  2 VIEW COMPARISON:  07/24/2014 FINDINGS: Cardiomediastinal silhouette is unremarkable. No acute infiltrate or pleural effusion. No pulmonary edema. Osteopenia and mild degenerative changes mid thoracic spine IMPRESSION: No active cardiopulmonary disease. Electronically Signed   By: Lahoma Crocker M.D.   On: 09/02/2016 13:58   Disposition   Pt is being discharged home today in good condition.  Follow-up Plans & Appointments    Follow-up Information    Dayna N Dunn, PA-C .   Specialties:  Cardiology, Radiology Why:  As below: 09/14/16 at [facility] information: [address] [place] [place] [address] [phone]          Discharge Instructions    Diet - low sodium heart healthy    Complete by:  As directed    Increase activity slowly    Complete by:  As directed    Some studies suggest Prilosec/Omeprazole interacts with Plavix. We changed your Prilosec/Omeprazole to the equivalent dose of Protonix for less chance of interaction.   No driving for 2 days. No lifting over 5  lbs for 1 week. No sexual activity for 1 week. Keep procedure site clean & dry. If you notice increased pain, swelling, bleeding or pus, call/return!  You may shower, but no soaking baths/hot tubs/pools for 1 week. If symptoms recur, please return for further evaluation.      Discharge Medications     Medication List    STOP taking these medications   omeprazole 20 MG capsule Commonly known as:  PRILOSEC Replaced by:  pantoprazole 40 MG tablet     TAKE these medications   amLODipine 5 MG tablet Commonly known as:  NORVASC Take 1 tablet (5 mg total) by mouth daily. What changed:  See the new instructions.   aspirin EC 81 MG tablet Take 81 mg by mouth daily.   atorvastatin 20 MG tablet Commonly known as:  LIPITOR Take 1 tablet (20 mg total) by mouth every evening. What changed:  See the new instructions.   clopidogrel 75 MG  tablet Commonly known as:  PLAVIX Take 1 tablet (75 mg total) by mouth daily. What changed:  See the new instructions.   ezetimibe 10 MG tablet Commonly known as:  ZETIA Take 1 tablet (10 mg total) by mouth daily. What changed:  See the new instructions.   FLUoxetine 20 MG tablet Commonly known as:  PROZAC Take 20 mg by mouth daily.   lamoTRIgine 25 MG tablet Commonly known as:  LAMICTAL Take 50 mg by mouth 2 (two) times daily.   lidocaine 5 % Commonly known as:  LIDODERM Place 2 patches onto the skin daily as needed. For pain Remove & Discard patch within 12 hours or as directed by MD   nitroGLYCERIN 0.4 MG SL tablet Commonly known as:  NITROSTAT Place 1 tablet (0.4 mg total) under the tongue every 5 (five) minutes as needed for chest pain. Up to 3 doses What changed:  additional instructions   pantoprazole 40 MG tablet Commonly known as:  PROTONIX Take 1 tablet (40 mg total) by mouth daily. Replaces:  omeprazole 20 MG capsule   pregabalin 50 MG capsule Commonly known as:  LYRICA Take 50 mg by mouth 2 (two) times daily.   ranolazine  500 MG 12 hr tablet Commonly known as:  RANEXA Take 1 tablet (500 mg total) by mouth 2 (two) times daily. What changed:  See the new instructions.   traMADol 50 MG tablet Commonly known as:  ULTRAM Take 50 mg by mouth every 6 (six) hours as needed for moderate pain.   TYSABRI 300 MG/15ML injection Generic drug:  natalizumab Inject 300 mg into the vein every 30 (thirty) days. Last given seven weeks ago from today(07-25-15)   Vitamin D (Ergocalciferol) 50000 units Caps capsule Commonly known as:  DRISDOL Take 50,000 Units by mouth every Saturday.   zolpidem 10 MG tablet Commonly known as:  AMBIEN Take 10 mg by mouth at bedtime.        Allergies:  Allergies  Allergen Reactions  . Morphine And Related Other (See Comments)    Pt states "It increases my angina"  . Simvastatin Other (See Comments)    "makes MS worse"   Outstanding Labs/Studies   NA  Duration of Discharge Encounter   Greater than 30 minutes including physician time.  Signed, Nedra Hai Dunn PA-C 09/03/2016, 2:02 PM   Personally seen and examined. Agree with above. Ready for DC Spoke to her at length about possible microvascular dz. She has been told this in the past.  Cath site normal.  Could consider low dose Imdur in future or increased Ranexa but BP tends to be an issue.  Low dose coreg could also be used in this setting as well.   Has close follow up.   Candee Furbish, MD

## 2016-09-03 NOTE — Progress Notes (Signed)
ANTICOAGULATION CONSULT NOTE - Follow-up Consult  Pharmacy Consult for heparin  Indication: chest pain/ACS  Allergies  Allergen Reactions  . Morphine And Related Other (See Comments)    Pt states "It increases my angina"  . Simvastatin Other (See Comments)    "makes MS worse"    Patient Measurements: Height: 5\' 2"  (157.5 cm) Weight: 99 lb 12.8 oz (45.3 kg) IBW/kg (Calculated) : 50.1 Heparin Dosing Weight: 46.4 kg  Vital Signs: Temp: 98.2 F (36.8 C) (09/21 0400) Temp Source: Oral (09/21 0400) BP: 97/48 (09/21 0400) Pulse Rate: 71 (09/21 0400)  Labs:  Recent Labs  09/02/16 1337 09/02/16 1928 09/02/16 2229 09/03/16 0100 09/03/16 0355  HGB 12.8  --   --   --  11.5*  HCT 38.9  --   --   --  34.7*  PLT 307  --   --   --  282  LABPROT 13.7  --   --   --   --   INR 1.05  --   --   --   --   HEPARINUNFRC  --   --  0.37  --  0.26*  CREATININE 0.61  --   --   --  0.68  TROPONINI <0.03 <0.03  --  <0.03  --     Estimated Creatinine Clearance: 54.8 mL/min (by C-G formula based on SCr of 0.68 mg/dL).   Assessment: 60 yo female with hx of CAD s/p PCI in 2006 and 2015. Pt on heparin gtt for ACS. Heparin level now down to subtherapeutic on 600 units/hr. No issues with line or bleeding reported per RN. Cath scheduled for 1200.  Goal of Therapy:  Heparin level 0.3-0.7 units/ml Monitor platelets by anticoagulation protocol: Yes   Plan:  -increase heparin to 700 units/hr -F/u post cath  Sherlon Handing, PharmD, BCPS Clinical pharmacist, pager [phone] 09/03/2016,5:48 AM

## 2016-09-03 NOTE — Interval H&P Note (Signed)
History and Physical Interval Note:  09/03/2016 8:30 AM  Crystal Clark  has presented today for cardiac catheterization, with the diagnosis of cp- unstable angina with known CAD-PCI.   The various methods of treatment have been discussed with the patient and family. After consideration of risks, benefits and other options for treatment, the patient has consented to  Procedure(s): Left Heart Cath and Coronary Angiography (N/A) with possible Percutaneous Coronary Intervention as a surgical intervention .    The patient's history has been reviewed, patient examined, no change in status, stable for surgery.  I have reviewed the patient's chart and labs.  Questions were answered to the patient's satisfaction.    Cath Lab Visit (complete for each Cath Lab visit)  Clinical Evaluation Leading to the Procedure:   ACS: Yes.  - Progressive Angina  Non-ACS:    Anginal Classification: CCS III  Anti-ischemic medical therapy: Maximal Therapy (2 or more classes of medications)  Non-Invasive Test Results: No non-invasive testing performed  Prior CABG: No previous CABG   Glenetta Hew

## 2016-09-03 NOTE — Discharge Instructions (Signed)
Radial Site Care °Refer to this sheet in the next few weeks. These instructions provide you with information about caring for yourself after your procedure. Your health care provider may also give you more specific instructions. Your treatment has been planned according to current medical practices, but problems sometimes occur. Call your health care provider if you have any problems or questions after your procedure. °WHAT TO EXPECT AFTER THE PROCEDURE °After your procedure, it is typical to have the following: °· Bruising at the radial site that usually fades within 1-2 weeks. °· Blood collecting in the tissue (hematoma) that may be painful to the touch. It should usually decrease in size and tenderness within 1-2 weeks. °HOME CARE INSTRUCTIONS °· Take medicines only as directed by your health care provider. °· You may shower 24-48 hours after the procedure or as directed by your health care provider. Remove the bandage (dressing) and gently wash the site with plain soap and water. Pat the area dry with a clean towel. Do not rub the site, because this may cause bleeding. °· Do not take baths, swim, or use a hot tub until your health care provider approves. °· Check your insertion site every day for redness, swelling, or drainage. °· Do not apply powder or lotion to the site. °· Do not flex or bend the affected arm for 24 hours or as directed by your health care provider. °· Do not push or pull heavy objects with the affected arm for 24 hours or as directed by your health care provider. °· Do not lift over 10 lb (4.5 kg) for 5 days after your procedure or as directed by your health care provider. °· Ask your health care provider when it is okay to: °¨ Return to work or school. °¨ Resume usual physical activities or sports. °¨ Resume sexual activity. °· Do not drive home if you are discharged the same day as the procedure. Have someone else drive you. °· You may drive 24 hours after the procedure unless otherwise  instructed by your health care provider. °· Do not operate machinery or power tools for 24 hours after the procedure. °· If your procedure was done as an outpatient procedure, which means that you went home the same day as your procedure, a responsible adult should be with you for the first 24 hours after you arrive home. °· Keep all follow-up visits as directed by your health care provider. This is important. °SEEK MEDICAL CARE IF: °· You have a fever. °· You have chills. °· You have increased bleeding from the radial site. Hold pressure on the site. °SEEK IMMEDIATE MEDICAL CARE IF: °· You have unusual pain at the radial site. °· You have redness, warmth, or swelling at the radial site. °· You have drainage (other than a small amount of blood on the dressing) from the radial site. °· The radial site is bleeding, and the bleeding does not stop after 30 minutes of holding steady pressure on the site. °· Your arm or hand becomes pale, cool, tingly, or numb. °  °This information is not intended to replace advice given to you by your health care provider. Make sure you discuss any questions you have with your health care provider. °  °Document Released: 01/02/2011 Document Revised: 12/21/2014 Document Reviewed: 06/18/2014 °Elsevier Interactive Patient Education ©2016 [employer] ° °

## 2016-09-03 NOTE — Progress Notes (Signed)
Pt discharged home. Discharge instructions have been gone over with the patient. IV's removed. Pt given unit number and told to call if they have any concerns regarding their discharge instructions. Tyvon Eggenberger V, RN   

## 2016-09-03 NOTE — Progress Notes (Signed)
Report received via Janett Billow RN using SBAR format, reviewed information given to her via ED, will assume care of patient when she arrives.

## 2016-09-14 ENCOUNTER — Ambulatory Visit (INDEPENDENT_AMBULATORY_CARE_PROVIDER_SITE_OTHER): Payer: PRIVATE HEALTH INSURANCE | Admitting: Physician Assistant

## 2016-09-14 ENCOUNTER — Encounter: Payer: Self-pay | Admitting: Physician Assistant

## 2016-09-14 VITALS — BP 130/70 | HR 70 | Ht 62.0 in | Wt 103.0 lb

## 2016-09-14 DIAGNOSIS — R0602 Shortness of breath: Secondary | ICD-10-CM | POA: Diagnosis not present

## 2016-09-14 DIAGNOSIS — R079 Chest pain, unspecified: Secondary | ICD-10-CM | POA: Diagnosis not present

## 2016-09-14 DIAGNOSIS — Z720 Tobacco use: Secondary | ICD-10-CM

## 2016-09-14 DIAGNOSIS — I2510 Atherosclerotic heart disease of native coronary artery without angina pectoris: Secondary | ICD-10-CM | POA: Diagnosis not present

## 2016-09-14 DIAGNOSIS — E785 Hyperlipidemia, unspecified: Secondary | ICD-10-CM | POA: Diagnosis not present

## 2016-09-14 LAB — D-DIMER, QUANTITATIVE: D-Dimer, Quant: <0.27 ug/mL-FEU (ref 0.00–0.50)

## 2016-09-14 MED ORDER — AMLODIPINE BESYLATE 5 MG PO TABS
7.5000 mg | ORAL_TABLET | Freq: Every day | ORAL | 3 refills | Status: DC
Start: 2016-09-14 — End: 2017-06-17

## 2016-09-14 NOTE — Patient Instructions (Signed)
Medication Instructions:  1) INCREASE Amlodipine to 7.5mg  daily  Labwork: DDimer today  Testing/Procedures: None  Follow-Up: Your physician wants you to follow-up in: 3 months with Dr. Percival Spanish. You will receive a reminder letter in the mail two months in advance. If you don't receive a letter, please call our office to schedule the follow-up appointment.   Any Other Special Instructions Will Be Listed Below (If Applicable).     If you need a refill on your cardiac medications before your next appointment, please call your pharmacy.

## 2016-09-14 NOTE — Progress Notes (Signed)
Cardiology Office Note    Date:  09/14/2016  ID:  KIMBERLI MORRIS, DOB [birth year], MRN [id number] PCP:  No PCP Per Patient  Cardiologist:  Dr. Percival Spanish   Chief Complaint: f/u cath  History of Present Illness:  Crystal Clark is a 60 y.o. female with history of CAD (s/p PCI to LAD 2006 in [place], unchanged cath 2013, PTCA/DES to distal LAD stent ISR in 2015, cath unchanged 08/2016), multiple sclerosis, hyperlipidemia, tobacco abuse, GERD, Raynaud's disease (on amlodipine for this) who presented to clinic with complaints of chest pain/shortness of breath. She been intolerant of Imdur due to interaction with MS meds and can not take BB due to Raynaud's disease. She has cardiovascular nursing experience - used to work at the [employer], was a Surveyor, quantity for [employer], and provided oversight for Pulmonary Rehab at [employer]. She recently presented to the office 09/02/16 with symptoms of chest pain concerning for [place], worse with exertion. She was admitted to the hospital and r/o for MI. Not tachycardic, tachypneic, or hypoxic, no signs of DVT, [place] criterial low risk. She underwent LHC 09/03/16 showing 10% stenosis in previously placed stents but essentially normal angiographically images with no culprit lesion to explain the patient's symptoms, normal LVEDP, normal LV function - could consider possible Variant/Prinzmetal angina in a patient with Raynaud's. Her BP was too soft to consider titration of amlodipine and as above, could not use Imdur due to med interaction. It was recommended to consider titration of Ranexa in follow-up and refer to pulm for eval of possible COPD if she has recurrent symptoms. We did not titrate statin due to h/o simvastatin exacerbating her MS in the past.  She presents back for follow-up. She says things haven't changed much - still notices a decreased level of endurance ever since all this started that one Thursday. Activity feels limited by dyspnea  on exertion. Still with occasional chest discomfort worse with exertion. She wonders if there is some truth to the Prinzmetal's theory as she's felt her Raynauds has been acting up.   Past Medical History:  Diagnosis Date  . Anemia   . Chronic mid back pain    "T7-8 herniated disc that cannot be repaired"  . Coronary artery disease    a) s/p stent to LAD in 2006 in [place], [place]. b) unchanged cath 2013. c) tandem 70% lesions distal LAD with ISR s/p balloon angio and DES 07/2014. d) stable cath 08/2016 without obstructive lesion.  . Dyslipidemia   . GERD (gastroesophageal reflux disease)   . History of blood transfusion 1959  . MS (multiple sclerosis) ([place])    a) since 1997. b) cannot take Imdur due to medication interactions.  . Raynaud's disease dx'd 2005   bb contraindicated  . Tobacco use    30+ pack years    Past Surgical History:  Procedure Laterality Date  . CARDIAC CATHETERIZATION  12/2011  . CARDIAC CATHETERIZATION N/A 09/03/2016   Procedure: Left Heart Cath and Coronary Angiography;  Surgeon: Leonie Man, MD;  Location: [facility];  Service: Cardiovascular;  Laterality: N/A;  . CESAREAN SECTION  X 2  . CORONARY ANGIOPLASTY WITH STENT PLACEMENT  03/2005  . CORONARY ANGIOPLASTY WITH STENT PLACEMENT  07/18/2014   ISRS- Mid LAD: Promus Premier 2.75 x 16 mm DES   . KNEE ARTHROSCOPY Left   . LEFT HEART CATHETERIZATION WITH CORONARY ANGIOGRAM N/A 12/23/2011   Procedure: LEFT HEART CATHETERIZATION WITH CORONARY ANGIOGRAM;  Surgeon: Wellington Hampshire, MD;  Location: [facility] CATH LAB;  Service: Cardiovascular;  Laterality: N/A;  . LEFT HEART CATHETERIZATION WITH CORONARY ANGIOGRAM N/A 07/18/2014   Procedure: LEFT HEART CATHETERIZATION WITH CORONARY ANGIOGRAM;  Surgeon: Sinclair Grooms, MD;  Location: [facility] CATH LAB;  Service: Cardiovascular;  Laterality: N/A;    Current Medications: Current Outpatient Prescriptions  Medication Sig Dispense Refill  . amLODipine (NORVASC) 5 MG tablet  Take 1.5 tablets (7.5 mg total) by mouth daily. 135 tablet 3  . aspirin EC 81 MG tablet Take 81 mg by mouth daily.      Marland Kitchen atorvastatin (LIPITOR) 20 MG tablet Take 1 tablet (20 mg total) by mouth every evening. 30 tablet 6  . clopidogrel (PLAVIX) 75 MG tablet Take 1 tablet (75 mg total) by mouth daily. 30 tablet 6  . ezetimibe (ZETIA) 10 MG tablet Take 1 tablet (10 mg total) by mouth daily. 30 tablet 6  . FLUoxetine (PROZAC) 20 MG tablet Take 20 mg by mouth daily.     Marland Kitchen lamoTRIgine (LAMICTAL) 25 MG tablet Take 50 mg by mouth 2 (two) times daily.    Marland Kitchen lidocaine (LIDODERM) 5 % Place 2 patches onto the skin daily as needed. For pain Remove & Discard patch within 12 hours or as directed by MD     . natalizumab (TYSABRI) 300 MG/15ML injection Inject 300 mg into the vein every 30 (thirty) days. Last given seven weeks ago from today(07-25-15)    . nitroGLYCERIN (NITROSTAT) 0.4 MG SL tablet Place 1 tablet (0.4 mg total) under the tongue every 5 (five) minutes as needed for chest pain. Up to 3 doses 25 tablet 3  . pantoprazole (PROTONIX) 40 MG tablet Take 1 tablet (40 mg total) by mouth daily. 30 tablet 1  . pregabalin (LYRICA) 50 MG capsule Take 50 mg by mouth 2 (two) times daily.    . ranolazine (RANEXA) 500 MG 12 hr tablet Take 1 tablet (500 mg total) by mouth 2 (two) times daily. 60 tablet 6  . traMADol (ULTRAM) 50 MG tablet Take 50 mg by mouth every 6 (six) hours as needed for moderate pain.     . Vitamin D, Ergocalciferol, (DRISDOL) 50000 UNITS CAPS capsule Take 50,000 Units by mouth every Saturday.     . zolpidem (AMBIEN) 10 MG tablet Take 10 mg by mouth at bedtime.     No current facility-administered medications for this visit.      Allergies:   Morphine and related and Simvastatin   Social History   Social History  . Marital status: Married    Spouse name: N/A  . Number of children: N/A  . Years of education: N/A   Occupational History  . 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 Smoking status: Current Every Day Smoker    Packs/day: 0.50    Years: 32.00    Types: Cigars, Cigarettes  . Smokeless tobacco: Never Used  . Alcohol use No  . Drug use: No  . Sexual activity: Not Asked   Other Topics Concern  . None   Social History Narrative  . None     Family History:  The patient's family history includes Alzheimer's disease in her mother; Colon polyps in her mother; Coronary artery disease in her father; Hypertension in her mother.   ROS:   Please see the history of present illness.  All other systems are reviewed and otherwise negative.  PHYSICAL EXAM:   VS:  BP 130/70   Pulse 70   Ht 5\' 2"  (1.575 m)   Wt 103 lb (46.7 kg)   BMI 18.84 kg/m   BMI: Body mass index is 18.84 kg/m. GEN: Well nourished, well developed WF, in no acute distress  HEENT: normocephalic, atraumatic Neck: no JVD, carotid bruits, or masses Cardiac: RRR; no murmurs, rubs, or gallops, no edema  Respiratory: mildly diminished BS throughout, no wheezes, rales or rhonchi; normal work of breathing GI: soft, nontender, nondistended, + BS MS: no deformity or atrophy  Skin: warm and dry, no rash, radial cath site without hematoma or ecchymosis; good pulse. Neuro:  Alert and Oriented x 3, Strength and sensation are intact, follows commands Psych: euthymic mood, full affect  Wt Readings from Last 3 Encounters:  09/14/16 103 lb (46.7 kg)  09/03/16 99 lb 12.8 oz (45.3 kg)  09/02/16 102 lb 6.4 oz (46.4 kg)      Studies/Labs Reviewed:   EKG:  EKG was not ordered today.  Recent Labs: 09/02/2016: ALT 14; TSH 1.357 09/03/2016: BUN <5; Creatinine, Ser 0.68; Hemoglobin 11.5; Platelets 282; Potassium 3.6; Sodium 139   Lipid Panel    Component Value Date/Time   CHOL 197 09/03/2016 0355   TRIG 54 09/03/2016 0355   HDL 75 09/03/2016 0355   CHOLHDL 2.6 09/03/2016 0355   VLDL 11 09/03/2016 0355   LDLCALC 111 (H) 09/03/2016 0355    Additional studies/  records that were reviewed today include: Summarized above.    ASSESSMENT & PLAN:   1. Chest pain/shortness of breath - symptoms have not changed much since recently OV. No obstructive lesion on cath, but could be experiencing coronary vasospasm. We discussed importance of tobacco cessation as this could be contributing. In the past her BP has been prohibitive of med titration. It was [id number] systolic on arrival and recheck 112/72 by me. She takes her amlodipine at night and wonders if she can add a small increase to her dose. I think this is worthwhile to try, with careful attention not to lower BP too much. She follows this. Will increase from 5mg  to 7.5mg  daily. I told her she can try doing the 5mg  at night as before, then add the 2.5mg  in the morning so she gets some of the daytime benefit but with less BP effect. I will also check d-dimer given the abrupt onset of her symptoms overall. If it is positive, she will need CTA to rule out PE. I offered her referral to pulmonology to evaluate for COPD but she declines. She wants to take things one step at a time. 2. Coronary artery disease - as above. Continue ASA, statin, Plavix. She is not on BB due to her Raynaud's disease.  3. Tobacco abuse - reviewed importance of cessation. She inquired about vaping and we discussed this also has inherent dangers - I told her some patients have success using it as a limited transition off cigarettes but this would be at her own risk. 4. Hyperlipidemia - continue statin. As above, dose not titrated at present time due to h/o exacerbations of MS with other statin.  Disposition: F/u with Dr. Percival Spanish in 3 months.   Medication Adjustments/Labs and Tests Ordered: Current medicines are reviewed at length with the patient today.  Concerns regarding medicines are outlined above. Medication changes, Labs and Tests ordered today are summarized above and listed in the Patient Instructions accessible in Encounters.    SignedMelina Copa PA-C  09/14/2016 3:43  PM    [facility] [place], [place], [place]  [address] Phone: [phone]; Fax: [phone]

## 2016-09-14 NOTE — Progress Notes (Signed)
Please let patient know d-dimer was negative.  Dayna Dunn PA-C

## 2016-11-12 ENCOUNTER — Other Ambulatory Visit: Payer: Self-pay | Admitting: Physician Assistant

## 2016-11-12 NOTE — Telephone Encounter (Signed)
REFILL 

## 2016-12-29 DIAGNOSIS — R413 Other amnesia: Secondary | ICD-10-CM | POA: Diagnosis not present

## 2016-12-29 DIAGNOSIS — G35 Multiple sclerosis: Secondary | ICD-10-CM | POA: Diagnosis not present

## 2016-12-29 DIAGNOSIS — G40909 Epilepsy, unspecified, not intractable, without status epilepticus: Secondary | ICD-10-CM | POA: Diagnosis not present

## 2016-12-29 DIAGNOSIS — M5416 Radiculopathy, lumbar region: Secondary | ICD-10-CM | POA: Diagnosis not present

## 2016-12-29 DIAGNOSIS — Z79899 Other long term (current) drug therapy: Secondary | ICD-10-CM | POA: Diagnosis not present

## 2016-12-29 DIAGNOSIS — G43009 Migraine without aura, not intractable, without status migrainosus: Secondary | ICD-10-CM | POA: Diagnosis not present

## 2017-02-04 DIAGNOSIS — G35 Multiple sclerosis: Secondary | ICD-10-CM | POA: Diagnosis not present

## 2017-02-22 ENCOUNTER — Encounter: Payer: Self-pay | Admitting: Physician Assistant

## 2017-03-01 ENCOUNTER — Telehealth: Payer: Self-pay | Admitting: Emergency Medicine

## 2017-03-01 ENCOUNTER — Ambulatory Visit (INDEPENDENT_AMBULATORY_CARE_PROVIDER_SITE_OTHER): Payer: PRIVATE HEALTH INSURANCE | Admitting: Physician Assistant

## 2017-03-01 ENCOUNTER — Other Ambulatory Visit (INDEPENDENT_AMBULATORY_CARE_PROVIDER_SITE_OTHER): Payer: PRIVATE HEALTH INSURANCE

## 2017-03-01 ENCOUNTER — Other Ambulatory Visit: Payer: Self-pay | Admitting: Emergency Medicine

## 2017-03-01 ENCOUNTER — Encounter: Payer: Self-pay | Admitting: Physician Assistant

## 2017-03-01 VITALS — BP 118/64 | HR 68 | Ht 62.0 in | Wt 108.0 lb

## 2017-03-01 DIAGNOSIS — R112 Nausea with vomiting, unspecified: Secondary | ICD-10-CM

## 2017-03-01 DIAGNOSIS — R1031 Right lower quadrant pain: Secondary | ICD-10-CM | POA: Diagnosis not present

## 2017-03-01 DIAGNOSIS — K219 Gastro-esophageal reflux disease without esophagitis: Secondary | ICD-10-CM

## 2017-03-01 DIAGNOSIS — R194 Change in bowel habit: Secondary | ICD-10-CM | POA: Diagnosis not present

## 2017-03-01 LAB — COMPREHENSIVE METABOLIC PANEL
ALT: 17 U/L (ref 0–35)
AST: 20 U/L (ref 0–37)
Albumin: 4.3 g/dL (ref 3.5–5.2)
Alkaline Phosphatase: 86 U/L (ref 39–117)
BUN: 8 mg/dL (ref 6–23)
CO2: 29 mEq/L (ref 19–32)
Calcium: 9.4 mg/dL (ref 8.4–10.5)
Chloride: 102 mEq/L (ref 96–112)
Creatinine, Ser: 0.72 mg/dL (ref 0.40–1.20)
GFR: 88.2 mL/min (ref >60.00)
Glucose, Bld: 89 mg/dL (ref 70–99)
Potassium: 4.2 mEq/L (ref 3.5–5.1)
Sodium: 139 mEq/L (ref 135–145)
Total Bilirubin: 0.4 mg/dL (ref 0.2–1.2)
Total Protein: 6.8 g/dL (ref 6.0–8.3)

## 2017-03-01 LAB — CBC WITH DIFFERENTIAL/PLATELET
Basophils Absolute: 0.1 10*3/uL (ref 0.0–0.1)
Basophils Relative: 0.8 % (ref 0.0–3.0)
Eosinophils Absolute: 0.1 10*3/uL (ref 0.0–0.7)
Eosinophils Relative: 2 % (ref 0.0–5.0)
HCT: 37.9 % (ref 36.0–46.0)
Hemoglobin: 12.7 g/dL (ref 12.0–15.0)
Lymphocytes Relative: 43.5 % (ref 12.0–46.0)
Lymphs Abs: 2.8 10*3/uL (ref 0.7–4.0)
MCHC: 33.6 g/dL (ref 30.0–36.0)
MCV: 88.9 fl (ref 78.0–100.0)
Monocytes Absolute: 0.5 10*3/uL (ref 0.1–1.0)
Monocytes Relative: 8.3 % (ref 3.0–12.0)
Neutro Abs: 2.9 10*3/uL (ref 1.4–7.7)
Neutrophils Relative %: 45.4 % (ref 43.0–77.0)
Platelets: 321 10*3/uL (ref 150.0–400.0)
RBC: 4.26 Mil/uL (ref 3.87–5.11)
RDW: 13.2 % (ref 11.5–15.5)
WBC: 6.4 10*3/uL (ref 4.0–10.5)

## 2017-03-01 LAB — LIPASE: Lipase: 44 U/L (ref 11.0–59.0)

## 2017-03-01 MED ORDER — PANTOPRAZOLE SODIUM 40 MG PO TBEC: 40.0000 mg | DELAYED_RELEASE_TABLET | Freq: Two times a day (BID) | ORAL | 2 refills | Status: DC

## 2017-03-01 MED ORDER — NA SULFATE-K SULFATE-MG SULF 17.5-3.13-1.6 GM/177ML PO SOLN: 1.0000 | ORAL | 0 refills | Status: DC

## 2017-03-01 NOTE — Telephone Encounter (Signed)
03/01/2017   RE: Crystal Clark DOB: 1956/10/20 MRN: [id number]   Dear Dr. Percival Spanish,    We have scheduled the above patient for an endoscopic procedure. Our records show that she is on anticoagulation therapy.   Please advise as to how long the patient may come off her therapy of Plavix prior to the procedure, which is scheduled for .  Please fax back/ or route this note to Tinnie Gens, [place].     Sincerely,    Tinnie Gens, Home

## 2017-03-01 NOTE — Progress Notes (Signed)
Chief Complaint: Abdominal pain, nausea, vomiting, reflux  HPI:  Crystal Clark is a 61 year old Caucasian female with a past medical history of chronic mid back pain herniated T 7-8 disc, coronary artery disease status post stent to LAD in 2006, maintained on Plavix and aspirin, GERD, MS since 1997, Raynaud's disease and others listed below who presents to clinic today as a new patient for complaint of abdominal pain, nausea, vomiting and reflux.   The patient has been followed in clinic previously by Dr. Ardis Hughs for a screening colonoscopy completed 03/06/08 with findings of one polyp. Pathology showed an adenoma and patient was told to repeat in 5 years.   Today, the patient presents to clinic accompanied by her daughter and tells me that typically her only pain comes from her MS and her angina. About 2-1/2 weeks ago she started with a right lower quadrant abdominal pain which is below the "belt line", and radiates just over to the center of her abdomen and stops. This has been associated with abdominal bloating which makes her have to "unbutton her pants", from time to time as well as nausea and vomiting. Prior to this the patient had noticed an increase in reflux symptoms which were awaking her in the middle of the night with an "acid vomit", and she was taking a lot of Tums on top of her Protonix 40 mg daily which she has been on since 2006. Patient tells me that just last night she had an episode of "projectile vomiting", she believes part of this is due to the fact that she had chili for dinner around 9:30 and laid down to sleep shortly thereafter.   She does tell me that her typical bowel movements are a variation from diarrhea to constipation due to her MS. She had been rather constipated lately and has used stool softeners which allowed her to have "regular bowel movements", which have not changed her abdominal pain. She has also been having some night sweats over the past 2 weeks since this  abdominal pain started. Patient has been using Tramadol 50 mg every 4-6 hours which has been helping, but this concerns her she does not normally have this pain and it started "out of the blue".   Patient denies fever, blood in her stool, melena, change in diet, change in medications, weight loss, fatigue, anorexia or dysphagia.  Past Medical History:  Diagnosis Date  . Anemia   . Chronic mid back pain    "T7-8 herniated disc that cannot be repaired"  . Coronary artery disease    a) s/p stent to LAD in 2006 in [place], [place]. b) unchanged cath 2013. c) tandem 70% lesions distal LAD with ISR s/p balloon angio and DES 07/2014. d) stable cath 08/2016 without obstructive lesion.  . Dyslipidemia   . GERD (gastroesophageal reflux disease)   . History of blood transfusion 1959  . MS (multiple sclerosis) ([place])    a) since 1997. b) cannot take Imdur due to medication interactions.  . Raynaud's disease dx'd 2005   bb contraindicated  . Tobacco use    30+ pack years    Past Surgical History:  Procedure Laterality Date  . CARDIAC CATHETERIZATION  12/2011  . CARDIAC CATHETERIZATION N/A 09/03/2016   Procedure: Left Heart Cath and Coronary Angiography;  Surgeon: Leonie Man, MD;  Location: [facility];  Service: Cardiovascular;  Laterality: N/A;  . CESAREAN SECTION  X 2  . CORONARY ANGIOPLASTY WITH STENT PLACEMENT  03/2005  .  CORONARY ANGIOPLASTY WITH STENT PLACEMENT  07/18/2014   ISRS- Mid LAD: Promus Premier 2.75 x 16 mm DES   . KNEE ARTHROSCOPY Left   . LEFT HEART CATHETERIZATION WITH CORONARY ANGIOGRAM N/A 12/23/2011   Procedure: LEFT HEART CATHETERIZATION WITH CORONARY ANGIOGRAM;  Surgeon: Wellington Hampshire, MD;  Location: [facility];  Service: Cardiovascular;  Laterality: N/A;  . LEFT HEART CATHETERIZATION WITH CORONARY ANGIOGRAM N/A 07/18/2014   Procedure: LEFT HEART CATHETERIZATION WITH CORONARY ANGIOGRAM;  Surgeon: Sinclair Grooms, MD;  Location: [facility];  Service: Cardiovascular;   Laterality: N/A;    Current Outpatient Prescriptions  Medication Sig Dispense Refill  . amLODipine (NORVASC) 5 MG tablet Take 1.5 tablets (7.5 mg total) by mouth daily. 135 tablet 3  . aspirin EC 81 MG tablet Take 81 mg by mouth daily.      Marland Kitchen atorvastatin (LIPITOR) 20 MG tablet Take 1 tablet (20 mg total) by mouth every evening. 30 tablet 6  . clopidogrel (PLAVIX) 75 MG tablet Take 1 tablet (75 mg total) by mouth daily. 30 tablet 6  . ezetimibe (ZETIA) 10 MG tablet Take 1 tablet (10 mg total) by mouth daily. 30 tablet 6  . FLUoxetine (PROZAC) 20 MG tablet Take 20 mg by mouth daily.     Marland Kitchen lamoTRIgine (LAMICTAL) 25 MG tablet Take 50 mg by mouth 2 (two) times daily.    Marland Kitchen lidocaine (LIDODERM) 5 % Place 2 patches onto the skin daily as needed. For pain Remove & Discard patch within 12 hours or as directed by MD     . natalizumab (TYSABRI) 300 MG/15ML injection Inject 300 mg into the vein every 30 (thirty) days. Last given seven weeks ago from today(07-25-15)    . nitroGLYCERIN (NITROSTAT) 0.4 MG SL tablet Place 1 tablet (0.4 mg total) under the tongue every 5 (five) minutes as needed for chest pain. Up to 3 doses 25 tablet 3  . pantoprazole (PROTONIX) 40 MG tablet TAKE 1 TABLET (40 MG TOTAL) BY MOUTH DAILY. 30 tablet 2  . pregabalin (LYRICA) 50 MG capsule Take 50 mg by mouth 2 (two) times daily.    . ranolazine (RANEXA) 500 MG 12 hr tablet Take 1 tablet (500 mg total) by mouth 2 (two) times daily. 60 tablet 6  . traMADol (ULTRAM) 50 MG tablet Take 50 mg by mouth every 6 (six) hours as needed for moderate pain.     . Vitamin D, Ergocalciferol, (DRISDOL) 50000 UNITS CAPS capsule Take 50,000 Units by mouth every Saturday.     . zolpidem (AMBIEN) 10 MG tablet Take 10 mg by mouth at bedtime.     No current facility-administered medications for this visit.     Allergies as of 03/01/2017 - Review Complete 03/01/2017  Allergen Reaction Noted  . Morphine and related Other (See Comments) 04/17/2016  .  Simvastatin Other (See Comments) 12/23/2011    Family History  Problem Relation Age of Onset  . Colon polyps Mother   . Alzheimer's disease Mother   . Hypertension Mother   . Coronary artery disease Father     s/p CABG at 3    Social History   Social History  . Marital status: Married    Spouse name: N/A  . Number of children: N/A  . Years of education: N/A   Occupational History  . 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 Smoking status: Current Every Day Smoker    Packs/day:  0.50    Years: 32.00    Types: Cigars, Cigarettes  . Smokeless tobacco: Never Used  . Alcohol use No  . Drug use: No  . Sexual activity: Not on file   Other Topics Concern  . Not on file   Social History Narrative  . No narrative on file    Review of Systems:    Constitutional: Positive for night sweats No weight loss or fever Skin: No rash Cardiovascular: No chest pain Respiratory: No SOB  Gastrointestinal: See HPI and otherwise negative Genitourinary: No dysuria or change in urinary frequency Neurological: No headache, dizziness or syncope Musculoskeletal: No new muscle or joint pain Hematologic: No bleeding or bruising Psychiatric: No history of depression or anxiety   Physical Exam:  Vital signs: BP 118/64   Pulse 68   Ht 5\' 2"  (1.575 m)   Wt 108 lb (49 kg)   SpO2 98%   BMI 19.75 kg/m   Constitutional:   Pleasant Thin-appearing Caucasian female appears to be in NAD, Well developed, Well nourished, alert and cooperative Head:  Normocephalic and atraumatic. Eyes:   PEERL, EOMI. No icterus. Conjunctiva pink. Ears:  Normal auditory acuity. Neck:  Supple Throat: Oral cavity and pharynx without inflammation, swelling or lesion.  Respiratory: Respirations even and unlabored. Lungs clear to auscultation bilaterally.   No wheezes, crackles, or rhonchi.  Cardiovascular: Normal S1, S2. No MRG. Regular rate and rhythm. No peripheral edema, cyanosis or  pallor.  Gastrointestinal:  Soft, mild distention, moderate right lower quadrant TTP No rebound or guarding. Normal bowel sounds. No appreciable masses or hepatomegaly. Rectal:  Not performed.  Msk:  Symmetrical without gross deformities. Without edema, no deformity or joint abnormality.  Neurologic:  Alert and  oriented x4;  grossly normal neurologically.  Skin:   Dry and intact without significant lesions or rashes. Psychiatric:  Demonstrates good judgement and reason without abnormal affect or behaviors.  MOST RECENT LABS: CBC    Component Value Date/Time   WBC 7.0 09/03/2016 0355   RBC 3.82 (L) 09/03/2016 0355   HGB 11.5 (L) 09/03/2016 0355   HCT 34.7 (L) 09/03/2016 0355   PLT 282 09/03/2016 0355   MCV 90.8 09/03/2016 0355   MCH 30.1 09/03/2016 0355   MCHC 33.1 09/03/2016 0355   RDW 13.8 09/03/2016 0355   LYMPHSABS 3.3 09/02/2016 1337   MONOABS 0.5 09/02/2016 1337   EOSABS 0.4 09/02/2016 1337   BASOSABS 0.1 09/02/2016 1337    CMP     Component Value Date/Time   NA 139 09/03/2016 0355   K 3.6 09/03/2016 0355   CL 106 09/03/2016 0355   CO2 27 09/03/2016 0355   GLUCOSE 85 09/03/2016 0355   BUN <5 (L) 09/03/2016 0355   CREATININE 0.68 09/03/2016 0355   CREATININE 0.63 07/11/2014 1223   CALCIUM 8.6 (L) 09/03/2016 0355   PROT 6.3 (L) 09/02/2016 1337   ALBUMIN 4.2 09/02/2016 1337   AST 19 09/02/2016 1337   ALT 14 09/02/2016 1337   ALKPHOS 75 09/02/2016 1337   BILITOT 0.6 09/02/2016 1337   GFRNONAA >60 09/03/2016 0355   GFRAA >60 09/03/2016 0355    Assessment: 1. Right lower quadrant abdominal pain: Started about 2 weeks ago, with nausea and vomiting and intermittent abdominal bloating; concern for partial bowel obstruction versus appendicitis versus other 2. Nausea and vomiting: With the above and increase in reflux, question relation to above versus gastritis? 3. GERD: Patient has been on a PPI since 2006 once daily, over the past 2-1/2  weeks has been having to use  Tums as well, waking in the middle of the night with reflux; consider gastritis versus H. pylori versus gallbladder etiology versus other 4. Change in bowel habits: She tells me she has been more constipated lately, she typically radiates back and forth from diarrhea to constipation, was able to have a good bowel movement with laxative; this is likely related to [place]: 1. Ordered a CT of the abdomen and pelvis with contrast for further evaluation of acute onset of right lower quadrant pain as well as nausea and vomiting. 2. Ordered labs to include a CBC, CMP, lipase 3. Scheduled patient for a colonoscopy. She is due for screening purposes regardless of abdominal pain due to her history of adenomatous polyps, but with recent abdominal pain will encourage her to do this sooner rather than later. Patient is trying to leave April 23 for [place]. She is hoping to have this done any time from April 7-23rd if we do not find anything on above studies. Did discuss risks, benefits, limitations and alternatives the patient agrees to proceed. This was scheduled with Dr. Ardis Hughs in the [facility]. 4. Patient was advised to hold her Plavix for 5 days prior to the procedure. We will discuss holding her Plavix with her cardiologist to insure this is acceptable for her. 5. Increased patient's Pantoprazole to 40 mg twice a day, 30-60 minutes before eating. Prescribed #60 with 2 refills 6. Patient to follow in clinic as directed after labs above or sooner if necessary.  Ellouise Newer, PA-C [facility] 03/01/2017, 9:51 AM  Cc: No ref. provider found

## 2017-03-01 NOTE — Patient Instructions (Signed)
You have been scheduled for a CT scan of the abdomen and pelvis at [facility] ([address]---this is in the same building as [facility]).   You are scheduled on 03-04-17 at 8:30 am. You should arrive 15 minutes prior to your appointment time for registration. Please follow the written instructions below on the day of your exam:  WARNING: IF YOU ARE ALLERGIC TO IODINE/X-RAY DYE, PLEASE NOTIFY RADIOLOGY IMMEDIATELY AT [phone]! YOU WILL BE GIVEN A 13 HOUR PREMEDICATION PREP.  1) Do not eat anything after 4:30 am  (4 hours prior to your test) 2) You have been given 2 bottles of oral contrast to drink. The solution may taste               better if refrigerated, but do NOT add ice or any other liquid to this solution. Shake             well before drinking.    Drink 1 bottle of contrast @ 6:30 am (2 hours prior to your exam)  Drink 1 bottle of contrast @ 7:30 am (1 hour prior to your exam)  You may take any medications as prescribed with a small amount of water except for the following: Metformin, Glucophage, Glucovance, Avandamet, Riomet, Fortamet, Actoplus Met, Janumet, Glumetza or Metaglip. The above medications must be held the day of the exam AND 48 hours after the exam.  The purpose of you drinking the oral contrast is to aid in the visualization of your intestinal tract. The contrast solution may cause some diarrhea. Before your exam is started, you will be given a small amount of fluid to drink. Depending on your individual set of symptoms, you may also receive an intravenous injection of x-ray contrast/dye. Plan on being at [facility] for 30 minutes or longer, depending on the type of exam you are having performed.  This test typically takes 30-45 minutes to complete.  If you have any questions regarding your exam or if you need to reschedule, you may call the CT department at [phone] between the hours of 8:00 am and 5:00 pm,  Monday-Friday.  ________________________________________________________________________   Dennis Bast have been scheduled for a colonoscopy. Please follow written instructions given to you at your visit today.  Please pick up your prep supplies at the pharmacy within the next 1-3 days. If you use inhalers (even only as needed), please bring them with you on the day of your procedure. Your physician has requested that you go to www.startemmi.com and enter the access code given to you at your visit today. This web site gives a general overview about your procedure. However, you should still follow specific instructions given to you by our office regarding your preparation for the procedure.  Your physician has requested that you go to the basement for lab work before leaving today.  Increase Pantoprazole to 40 mg twice a day.

## 2017-03-01 NOTE — Progress Notes (Signed)
I agree with the above note, plan 

## 2017-03-01 NOTE — Telephone Encounter (Signed)
The patient can hold her Plavix as needed for the procedure.

## 2017-03-02 NOTE — Telephone Encounter (Signed)
Generally Plavix is held from 5-7 days before the procedure. Which do you prefer?  Thanks,  Tinnie Gens, [place]

## 2017-03-02 NOTE — Telephone Encounter (Signed)
Five

## 2017-03-03 ENCOUNTER — Telehealth: Payer: Self-pay | Admitting: Emergency Medicine

## 2017-03-03 NOTE — Telephone Encounter (Signed)
Prior auth started for Pantoprazole 40 mg bid with Cover my meds.

## 2017-03-04 ENCOUNTER — Ambulatory Visit (INDEPENDENT_AMBULATORY_CARE_PROVIDER_SITE_OTHER)
Admission: RE | Admit: 2017-03-04 | Discharge: 2017-03-04 | Disposition: A | Discharge status: Home / Self Care | Payer: PRIVATE HEALTH INSURANCE | Source: Ambulatory Visit | Attending: Physician Assistant | Admitting: Physician Assistant

## 2017-03-04 ENCOUNTER — Encounter: Payer: Self-pay | Admitting: Emergency Medicine

## 2017-03-04 DIAGNOSIS — R1031 Right lower quadrant pain: Secondary | ICD-10-CM | POA: Diagnosis not present

## 2017-03-04 MED ORDER — IOPAMIDOL (ISOVUE-300) INJECTION 61%
100.0000 mL | Freq: Once | INTRAVENOUS | Status: AC | PRN
  Administered 2017-03-04: 80 mL via INTRAVENOUS

## 2017-03-04 NOTE — Telephone Encounter (Signed)
Left message on patient's voicemail.

## 2017-03-04 NOTE — Telephone Encounter (Signed)
Left patient messages with no response, sent her a mychart message that we were trying to get in touch with her and to call the office.

## 2017-03-04 NOTE — Telephone Encounter (Signed)
We can try Lansoprazole 30 mg bid. Thanks-JLL

## 2017-03-04 NOTE — Telephone Encounter (Signed)
Received fax from insurance company that Omeprazole 40 mg twice a day is not covered. Her plan will only allow once a day. Please advise.

## 2017-03-11 NOTE — Telephone Encounter (Signed)
Left message to call office

## 2017-03-16 ENCOUNTER — Other Ambulatory Visit: Payer: Self-pay | Admitting: Emergency Medicine

## 2017-03-23 ENCOUNTER — Telehealth: Payer: Self-pay | Admitting: Emergency Medicine

## 2017-03-23 NOTE — Telephone Encounter (Signed)
Spoke to patient and informed her to hold Plavix x 5 days before her procedure. She stated she already knew that and will do it. I also informed her that her insuraance will not cover Omeprazole twice a day and we could start her on Lansoprazole bid. Patient reports that she already has paid out of pocket for the Pantoprazole and it was only $16 more. She is not interested in changing prescriptions at this time.

## 2017-04-01 ENCOUNTER — Other Ambulatory Visit: Payer: Self-pay | Admitting: Emergency Medicine

## 2017-04-01 MED ORDER — PANTOPRAZOLE SODIUM 40 MG PO TBEC: 40.0000 mg | DELAYED_RELEASE_TABLET | Freq: Two times a day (BID) | ORAL | 2 refills | Status: AC

## 2017-04-01 NOTE — Telephone Encounter (Signed)
Patients pharmacy requesting 90 day.

## 2017-04-08 ENCOUNTER — Encounter: Payer: Self-pay | Admitting: Gastroenterology

## 2017-04-20 ENCOUNTER — Ambulatory Visit (AMBULATORY_SURGERY_CENTER): Payer: PRIVATE HEALTH INSURANCE | Admitting: Gastroenterology

## 2017-04-20 ENCOUNTER — Encounter: Payer: Self-pay | Admitting: Gastroenterology

## 2017-04-20 VITALS — BP 136/76 | HR 57 | Temp 98.9°F | Resp 15 | Ht 62.0 in | Wt 108.0 lb

## 2017-04-20 DIAGNOSIS — D124 Benign neoplasm of descending colon: Secondary | ICD-10-CM

## 2017-04-20 DIAGNOSIS — D123 Benign neoplasm of transverse colon: Secondary | ICD-10-CM

## 2017-04-20 DIAGNOSIS — D122 Benign neoplasm of ascending colon: Secondary | ICD-10-CM

## 2017-04-20 DIAGNOSIS — D125 Benign neoplasm of sigmoid colon: Secondary | ICD-10-CM

## 2017-04-20 DIAGNOSIS — R1031 Right lower quadrant pain: Secondary | ICD-10-CM | POA: Diagnosis present

## 2017-04-20 DIAGNOSIS — D120 Benign neoplasm of cecum: Secondary | ICD-10-CM

## 2017-04-20 MED ORDER — SODIUM CHLORIDE 0.9 % IV SOLN: 500.0000 mL | INTRAVENOUS | Status: DC

## 2017-04-20 NOTE — Progress Notes (Signed)
Spontaneous respirations throughout. VSS. Resting comfortably. To PACU on room air. Report to  Penny RN.  

## 2017-04-20 NOTE — Progress Notes (Signed)
Called to room to assist during endoscopic procedure.  Patient ID and intended procedure confirmed with present staff. Received instructions for my participation in the procedure from the performing physician.  

## 2017-04-20 NOTE — Patient Instructions (Signed)
YOU HAD AN ENDOSCOPIC PROCEDURE TODAY AT [facility]:   Refer to the procedure report that was given to you for any specific questions about what was found during the examination.  If the procedure report does not answer your questions, please call your gastroenterologist to clarify.  If you requested that your care partner not be given the details of your procedure findings, then the procedure report has been included in a sealed envelope for you to review at your convenience later.  YOU SHOULD EXPECT: Some feelings of bloating in the abdomen. Passage of more gas than usual.  Walking can help get rid of the air that was put into your GI tract during the procedure and reduce the bloating. If you had a lower endoscopy (such as a colonoscopy or flexible sigmoidoscopy) you may notice spotting of blood in your stool or on the toilet paper. If you underwent a bowel prep for your procedure, you may not have a normal bowel movement for a few days.  Please Note:  You might notice some irritation and congestion in your nose or some drainage.  This is from the oxygen used during your procedure.  There is no need for concern and it should clear up in a day or so.  SYMPTOMS TO REPORT IMMEDIATELY:   Following lower endoscopy (colonoscopy or flexible sigmoidoscopy):  Excessive amounts of blood in the stool  Significant tenderness or worsening of abdominal pains  Swelling of the abdomen that is new, acute  Fever of 100F or higher    For urgent or emergent issues, a gastroenterologist can be reached at any hour by calling [phone].   DIET:  We do recommend a small meal at first, but then you may proceed to your regular diet.  Drink plenty of fluids but you should avoid alcoholic beverages for 24 hours.  ACTIVITY:  You should plan to take it easy for the rest of today and you should NOT DRIVE or use heavy machinery until tomorrow (because of the sedation medicines used during the test).     FOLLOW UP: Our staff will call the number listed on your records the next business day following your procedure to check on you and address any questions or concerns that you may have regarding the information given to you following your procedure. If we do not reach you, we will leave a message.  However, if you are feeling well and you are not experiencing any problems, there is no need to return our call.  We will assume that you have returned to your regular daily activities without incident.  If any biopsies were taken you will be contacted by phone or by letter within the next 1-3 weeks.  Please call us at [phone] if you have not heard about the biopsies in 3 weeks.    SIGNATURES/CONFIDENTIALITY: You and/or your care partner have signed paperwork which will be entered into your electronic medical record.  These signatures attest to the fact that that the information above on your After Visit Summary has been reviewed and is understood.  Full responsibility of the confidentiality of this discharge information lies with you and/or your care-partner.  RESTART PLAVIX IN 2 DAYS,NO ASPIRIN OR NSAIDS FOR 2 WEEKS( INCLUDING LOW DOSE ASPIRIN PER DR JACOBS)  Information on polyps given to you today  Await pathology results on polyps removed

## 2017-04-20 NOTE — Op Note (Signed)
[facility] Patient Name: Crystal Clark Procedure Date: 04/20/2017 7:53 AM MRN: [id number] Endoscopist: [name] , MD Age: 61 Referring MD:  Date of Birth: 1956/02/11 Gender: Female Account #: [id number] Procedure:                Colonoscopy Indications:              High risk colon cancer surveillance: Personal                            history of colonic polyps (small polyp 2009), RLQ                            abd pain, long term MS Medicines:                Monitored Anesthesia Care Procedure:                Pre-Anesthesia Assessment:                           - Prior to the procedure, a History and Physical                            was performed, and patient medications and                            allergies were reviewed. The patient's tolerance of                            previous anesthesia was also reviewed. The risks                            and benefits of the procedure and the sedation                            options and risks were discussed with the patient.                            All questions were answered, and informed consent                            was obtained. Prior Anticoagulants: The patient has                            taken Plavix (clopidogrel), last dose was 5 days                            prior to procedure. ASA Grade Assessment: II - A                            patient with mild systemic disease. After reviewing                            the risks and benefits, the patient was deemed in  satisfactory condition to undergo the procedure.                           After obtaining informed consent, the colonoscope                            was passed under direct vision. Throughout the                            procedure, the patient's blood pressure, pulse, and                            oxygen saturations were monitored continuously. The                            Colonoscope was introduced  through the anus and                            advanced to the the cecum, identified by                            appendiceal orifice and ileocecal valve. The                            colonoscopy was performed without difficulty. The                            patient tolerated the procedure well. The quality                            of the bowel preparation was good. The ileocecal                            valve, appendiceal orifice, and rectum were                            photographed. Scope In: 8:31:38 AM Scope Out: 9:09:21 AM Scope Withdrawal Time: 0 hours 34 minutes 7 seconds  Total Procedure Duration: 0 hours 37 minutes 43 seconds  Findings:                 Five semi-sessile polyps were found in the                            ascending colon and cecum. The polyps were 5 to 12                            mm in size. These polyps were removed with a hot                            snare. Resection and retrieval were complete.                           Two sessile polyps were found in the transverse  colon. The polyps were 5 to 6 mm in size. These                            polyps were removed with a cold snare. Resection                            and retrieval were complete.                           Two semi-sessile polyps were found in the                            transverse colon. The polyps were 13 to 20 mm in                            size. These polyps were removed with a piecemeal                            technique using a hot snare. Resection and                            retrieval were complete. Area was tattooed with an                            injection of [place] ink.                           Three sessile polyps were found in the sigmoid                            colon and descending colon. The polyps were 5 to 6                            mm in size. These polyps were removed with a cold                            snare.  Resection and retrieval were complete.                           A 14 mm polyp was found in the sigmoid colon. The                            polyp was sessile. The polyp was removed with a                            piecemeal technique using a hot snare. Resection                            and retrieval were complete.                           The exam was otherwise without abnormality on  direct and retroflexion views. Complications:            No immediate complications. Estimated blood loss:                            None. Estimated Blood Loss:     Estimated blood loss: none. Impression:               - Five 5 to 12 mm polyps in the ascending colon and                            in the cecum, removed with a hot snare/cold snare                            (the largest two in ascending were removed with hot                            snare). Resected and retrieved. Jar 1                           - Two 5 to 6 mm polyps in the transverse colon,                            removed with a cold snare. Resected and retrieved.                            Jar 2                           - Two 13 to 20 mm polyps in the transverse colon,                            both removed piecemeal using a hot snare. Resected                            and retrieved. The site of the largest one (the                            more distal polyp) was injected after removal with                            [place] Ink. Jar 3                           - Three 5 to 6 mm polyps in the sigmoid colon and                            in the descending colon, removed with a cold snare.                            Resected and retrieved. Jar 4                           -  One 14 mm polyp in the sigmoid colon, removed                            piecemeal using a hot snare. Resected and                            retrieved. Jar 5.                           - The examination was otherwise normal on  direct                            and retroflexion views. Recommendation:           - Patient has a contact number available for                            emergencies. The signs and symptoms of potential                            delayed complications were discussed with the                            patient. Return to normal activities tomorrow.                            Written discharge instructions were provided to the                            patient.                           - Resume previous diet.                           - Continue present medications. Restart plavix in 2                            days, No ASA or NSAIDs for 2 weeks.                           - It is unlikely that any of these polyps are the                            cause of your abd pains, I recommend continuing                            with neurologic workup to check for new MS lesions.                           - Repeat colonoscopy is recommended (likely in 6                            months) The colonoscopy date will be determined  after pathology results from today's exam become                            available for review. [name], MD 04/20/2017 9:22:22 AM This report has been signed electronically.

## 2017-04-21 ENCOUNTER — Telehealth: Payer: Self-pay | Admitting: *Deleted

## 2017-04-21 NOTE — Telephone Encounter (Signed)
No answer. Name identifier. Message left to call if questions or concerns and an additional attempt will be made to reach later in the day.

## 2017-04-22 ENCOUNTER — Telehealth: Payer: Self-pay | Admitting: *Deleted

## 2017-04-22 NOTE — Telephone Encounter (Signed)
  Follow up Call-  Call back number 04/20/2017  Post procedure Call Back phone  # [phone]  Permission to leave phone message Yes  Some recent data might be hidden    [facility]

## 2017-04-26 ENCOUNTER — Encounter: Payer: Self-pay | Admitting: Gastroenterology

## 2017-04-28 ENCOUNTER — Telehealth: Payer: Self-pay | Admitting: Gastroenterology

## 2017-04-28 NOTE — Telephone Encounter (Signed)
Crystal Clark Please call the patient. The polyps I removed last night were not cancer but were all typical 'precancerous' polyps. She needs recall colonoscopy in 6 months. Remind her that i'd like to know what her neurologist says about her pain workup.  [name], no result note is needed.

## 2017-04-28 NOTE — Telephone Encounter (Signed)
The pt was given her pathology results and 6 month recall in EPIC.  She will have the neuro notes sent to Dr Ardis Hughs for review

## 2017-06-15 DIAGNOSIS — G35 Multiple sclerosis: Secondary | ICD-10-CM | POA: Diagnosis not present

## 2017-06-17 ENCOUNTER — Ambulatory Visit (INDEPENDENT_AMBULATORY_CARE_PROVIDER_SITE_OTHER): Payer: PRIVATE HEALTH INSURANCE | Admitting: Cardiology

## 2017-06-17 ENCOUNTER — Encounter: Payer: Self-pay | Admitting: Cardiology

## 2017-06-17 VITALS — BP 114/72 | HR 72 | Ht 62.0 in | Wt 103.2 lb

## 2017-06-17 DIAGNOSIS — I209 Angina pectoris, unspecified: Secondary | ICD-10-CM | POA: Diagnosis not present

## 2017-06-17 DIAGNOSIS — I7300 Raynaud's syndrome without gangrene: Secondary | ICD-10-CM

## 2017-06-17 DIAGNOSIS — G35 Multiple sclerosis: Secondary | ICD-10-CM | POA: Diagnosis not present

## 2017-06-17 DIAGNOSIS — I2510 Atherosclerotic heart disease of native coronary artery without angina pectoris: Secondary | ICD-10-CM

## 2017-06-17 DIAGNOSIS — Z9861 Coronary angioplasty status: Secondary | ICD-10-CM

## 2017-06-17 DIAGNOSIS — R002 Palpitations: Secondary | ICD-10-CM | POA: Diagnosis not present

## 2017-06-17 DIAGNOSIS — Z720 Tobacco use: Secondary | ICD-10-CM

## 2017-06-17 DIAGNOSIS — E785 Hyperlipidemia, unspecified: Secondary | ICD-10-CM

## 2017-06-17 MED ORDER — ATORVASTATIN CALCIUM 20 MG PO TABS: 20.0000 mg | ORAL_TABLET | Freq: Every evening | ORAL | 3 refills | Status: DC

## 2017-06-17 MED ORDER — CLOPIDOGREL BISULFATE 75 MG PO TABS: 75.0000 mg | ORAL_TABLET | Freq: Every day | ORAL | 3 refills | Status: DC

## 2017-06-17 MED ORDER — AMLODIPINE BESYLATE 5 MG PO TABS: 7.5000 mg | ORAL_TABLET | Freq: Every day | ORAL | 3 refills | Status: DC

## 2017-06-17 MED ORDER — RANOLAZINE ER 500 MG PO TB12: 500.0000 mg | ORAL_TABLET | Freq: Two times a day (BID) | ORAL | 3 refills | Status: DC

## 2017-06-17 MED ORDER — NITROGLYCERIN 0.4 MG SL SUBL: 0.4000 mg | SUBLINGUAL_TABLET | SUBLINGUAL | 3 refills | Status: DC | PRN

## 2017-06-17 MED ORDER — EZETIMIBE 10 MG PO TABS: 10.0000 mg | ORAL_TABLET | Freq: Every day | ORAL | 6 refills | Status: DC

## 2017-06-17 NOTE — Assessment & Plan Note (Signed)
Possibly Prinzmetal's angina

## 2017-06-17 NOTE — Assessment & Plan Note (Signed)
LDL 111 Sept 2017

## 2017-06-17 NOTE — Assessment & Plan Note (Signed)
Pt says she quit as of Nov 2017
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
## 2017-06-17 NOTE — Assessment & Plan Note (Signed)
Multiple PCI's- LAD PCI 2006-patent 2013 LAD ISR-PCI 2015- patent Sept 2017, no other CAD

## 2017-06-17 NOTE — Patient Instructions (Signed)
Your physician has recommended that you wear a 2 week  event monitor for palpitations. Event monitors are medical devices that record the heart's electrical activity. Doctors most often [place] these monitors to diagnose arrhythmias. Arrhythmias are problems with the speed or rhythm of the heartbeat. The monitor is a small, portable device. You can wear one while you do your normal daily activities. This is usually used to diagnose what is causing palpitations/syncope (passing out).  Your physician recommends that you schedule a follow-up appointment in: September or October with Dr Percival Spanish.

## 2017-06-17 NOTE — Assessment & Plan Note (Signed)
Pt complaining of hard heart beats that have gotten so bad she has stopped driving

## 2017-06-17 NOTE — Assessment & Plan Note (Signed)
Since 1997- MRI 2015 stable white matter disease

## 2017-06-17 NOTE — Assessment & Plan Note (Signed)
On Ca++ blocker

## 2017-06-21 DIAGNOSIS — R102 Pelvic and perineal pain: Secondary | ICD-10-CM | POA: Diagnosis not present

## 2017-07-01 DIAGNOSIS — G35 Multiple sclerosis: Secondary | ICD-10-CM | POA: Diagnosis not present

## 2017-08-06 ENCOUNTER — Encounter: Payer: Self-pay | Admitting: Cardiology

## 2017-08-09 DIAGNOSIS — Z1231 Encounter for screening mammogram for malignant neoplasm of breast: Secondary | ICD-10-CM | POA: Diagnosis not present

## 2017-08-09 DIAGNOSIS — Z01419 Encounter for gynecological examination (general) (routine) without abnormal findings: Secondary | ICD-10-CM | POA: Diagnosis not present

## 2017-08-09 DIAGNOSIS — Z1151 Encounter for screening for human papillomavirus (HPV): Secondary | ICD-10-CM | POA: Diagnosis not present

## 2017-08-09 DIAGNOSIS — Z124 Encounter for screening for malignant neoplasm of cervix: Secondary | ICD-10-CM | POA: Diagnosis not present

## 2017-08-09 DIAGNOSIS — Z130 Encounter for screening for diseases of the blood and blood-forming organs and certain disorders involving the immune mechanism: Secondary | ICD-10-CM | POA: Diagnosis not present

## 2017-08-09 DIAGNOSIS — Z1389 Encounter for screening for other disorder: Secondary | ICD-10-CM | POA: Diagnosis not present

## 2017-08-25 ENCOUNTER — Other Ambulatory Visit: Payer: Self-pay | Admitting: Obstetrics and Gynecology

## 2017-08-25 DIAGNOSIS — R928 Other abnormal and inconclusive findings on diagnostic imaging of breast: Secondary | ICD-10-CM

## 2017-08-26 ENCOUNTER — Ambulatory Visit: Payer: PRIVATE HEALTH INSURANCE | Admitting: Cardiology

## 2017-09-09 ENCOUNTER — Ambulatory Visit
Admission: RE | Admit: 2017-09-09 | Discharge: 2017-09-09 | Disposition: A | Discharge status: Home / Self Care | Payer: Medicare Other | Source: Ambulatory Visit | Attending: Obstetrics and Gynecology | Admitting: Obstetrics and Gynecology

## 2017-09-09 ENCOUNTER — Ambulatory Visit: Payer: PRIVATE HEALTH INSURANCE

## 2017-09-09 DIAGNOSIS — R928 Other abnormal and inconclusive findings on diagnostic imaging of breast: Secondary | ICD-10-CM

## 2017-09-15 ENCOUNTER — Ambulatory Visit (INDEPENDENT_AMBULATORY_CARE_PROVIDER_SITE_OTHER): Payer: PRIVATE HEALTH INSURANCE

## 2017-09-15 DIAGNOSIS — R002 Palpitations: Secondary | ICD-10-CM | POA: Diagnosis not present

## 2017-10-13 ENCOUNTER — Encounter: Payer: Self-pay | Admitting: *Deleted

## 2017-11-01 ENCOUNTER — Encounter: Payer: Self-pay | Admitting: Gastroenterology

## 2017-11-23 ENCOUNTER — Ambulatory Visit: Payer: PRIVATE HEALTH INSURANCE | Admitting: Physician Assistant

## 2017-12-29 NOTE — Progress Notes (Signed)
Cardiology Office Note   Date:  12/31/2017   ID:  CARLEENA MIRES, DOB 07-15-1956, MRN [id number]  PCP:  Patient, No Pcp Per  Cardiologist:   Minus Breeding, MD   Chief Complaint  Patient presents with  . Dizziness     History of Present Illness: Crystal Clark is a 62 y.o. female who presents for evaluation of CAD.(s/p PCI to LAD 2006 in [place], unchanged cath 2013, PTCA/DES to distal LAD stent ISR in 2015, cath unchanged 08/2016), multiple sclerosis, hyperlipidemia, tobacco abuse, GERD, Raynaud's disease (on amlodipine for this). She been intolerant of Imdur due to interaction with MS meds and can not take BB due to Raynaud's disease. She has cardiovascular nursing experience - used to work at the [employer], was a Surveyor, quantity for [employer], and provided oversight for Pulmonary Rehab at [employer]. She has had recurrent chest pian after her last cath, possibly secondary to coronary spasm. She is in the office today for routine follow up.  At the last visit she was complaining of palpitations.  She was drinking quite a bit of caffeine and she was under quite a bit of stress.  She wore a Holter but had no significant arrhythmias.    She continues to have symptoms of palpitations episodically.  She feels very swimmy headed and nauseated.  Feels her chest pounding.  She might feel like she needs to vomit.  She continues to have abdominal discomfort episodically.  She continues to have abdominal discomfort and has had an extensive GI workup.  She was found to have colonic polyps but no other etiology.   Past Medical History:  Diagnosis Date  . Anemia   . Chronic mid back pain    "T7-8 herniated disc that cannot be repaired"  . Coronary artery disease    a) s/p stent to LAD in 2006 in [place], [place]. b) unchanged cath 2013. c) tandem 70% lesions distal LAD with ISR s/p balloon angio and DES 07/2014. d) stable cath 08/2016 without obstructive lesion.  . Dyslipidemia    . GERD (gastroesophageal reflux disease)   . History of blood transfusion 1959  . MS (multiple sclerosis) ([place])    a) since 1997. b) cannot take Imdur due to medication interactions.  . Raynaud's disease dx'd 2005   bb contraindicated  . Tobacco use    30+ pack years    Past Surgical History:  Procedure Laterality Date  . CARDIAC CATHETERIZATION  12/2011  . CARDIAC CATHETERIZATION N/A 09/03/2016   Procedure: Left Heart Cath and Coronary Angiography;  Surgeon: Leonie Man, MD;  Location: [facility];  Service: Cardiovascular;  Laterality: N/A;  . CESAREAN SECTION  X 2  . CORONARY ANGIOPLASTY WITH STENT PLACEMENT  03/2005  . CORONARY ANGIOPLASTY WITH STENT PLACEMENT  07/18/2014   ISRS- Mid LAD: Promus Premier 2.75 x 16 mm DES   . KNEE ARTHROSCOPY Left   . LEFT HEART CATHETERIZATION WITH CORONARY ANGIOGRAM N/A 12/23/2011   Procedure: LEFT HEART CATHETERIZATION WITH CORONARY ANGIOGRAM;  Surgeon: Wellington Hampshire, MD;  Location: [facility];  Service: Cardiovascular;  Laterality: N/A;  . LEFT HEART CATHETERIZATION WITH CORONARY ANGIOGRAM N/A 07/18/2014   Procedure: LEFT HEART CATHETERIZATION WITH CORONARY ANGIOGRAM;  Surgeon: Sinclair Grooms, MD;  Location: [facility];  Service: Cardiovascular;  Laterality: N/A;     Current Outpatient Medications  Medication Sig Dispense Refill  . amLODipine (NORVASC) 5 MG tablet Take 1.5  tablets (7.5 mg total) by mouth daily. 135 tablet 3  . aspirin EC 81 MG tablet Take 81 mg by mouth daily.      Marland Kitchen atorvastatin (LIPITOR) 20 MG tablet Take 1 tablet (20 mg total) by mouth every evening. 90 tablet 3  . clopidogrel (PLAVIX) 75 MG tablet Take 1 tablet (75 mg total) by mouth daily. 90 tablet 3  . ezetimibe (ZETIA) 10 MG tablet Take 1 tablet (10 mg total) by mouth daily. 30 tablet 6  . FLUoxetine (PROZAC) 20 MG tablet Take 20 mg by mouth daily.     Marland Kitchen HYDROcodone-acetaminophen (NORCO/VICODIN) 5-325 MG tablet Take 1 tablet by mouth every 6 (six) hours as  needed for moderate pain.    Marland Kitchen lamoTRIgine (LAMICTAL) 25 MG tablet Take 50 mg by mouth 2 (two) times daily.    Marland Kitchen lidocaine (LIDODERM) 5 % Place 2 patches onto the skin daily as needed. For pain Remove & Discard patch within 12 hours or as directed by MD     . natalizumab (TYSABRI) 300 MG/15ML injection Inject 300 mg into the vein every 30 (thirty) days. Last given seven weeks ago from today(07-25-15)    . nitroGLYCERIN (NITROSTAT) 0.4 MG SL tablet Place 1 tablet (0.4 mg total) under the tongue every 5 (five) minutes as needed for chest pain. X 3 doses 25 tablet 3  . pantoprazole (PROTONIX) 40 MG tablet Take 1 tablet (40 mg total) by mouth 2 (two) times daily. 180 tablet 2  . pregabalin (LYRICA) 50 MG capsule Take 50 mg by mouth 2 (two) times daily.    . ranolazine (RANEXA) 500 MG 12 hr tablet Take 1 tablet (500 mg total) by mouth 2 (two) times daily. 180 tablet 3  . traMADol (ULTRAM) 50 MG tablet Take 50 mg by mouth every 6 (six) hours as needed for moderate pain.     . Vitamin D, Ergocalciferol, (DRISDOL) 50000 UNITS CAPS capsule Take 50,000 Units by mouth every Saturday.     . zolpidem (AMBIEN) 10 MG tablet Take 10 mg by mouth at bedtime.     Current Facility-Administered Medications  Medication Dose Route Frequency Provider Last Rate Last Dose  . 0.9 %  sodium chloride infusion  500 mL Intravenous Continuous [name], MD        Allergies:   Morphine and related and Simvastatin    ROS:  Please see the history of present illness.   Otherwise, review of systems are positive for none.   All other systems are reviewed and negative.    PHYSICAL EXAM: VS:  BP 120/74   Pulse 65   Ht 5\' 2"  (1.575 m)   Wt 120 lb (54.4 kg)   SpO2 98%   BMI 21.95 kg/m  , BMI Body mass index is 21.95 kg/m.  GENERAL:  Well appearing NECK:  No jugular venous distention, waveform within normal limits, carotid upstroke brisk and symmetric, no bruits, no thyromegaly LUNGS:  Clear to auscultation  bilaterally CHEST:  Unremarkable HEART:  PMI not displaced or sustained,S1 and S2 within normal limits, no S3, no S4, no clicks, no rubs, no murmurs ABD:  Flat, positive bowel sounds normal in frequency in pitch, no bruits, no rebound, no guarding, possible midline pulsatile mass, no hepatomegaly, no splenomegaly EXT:  2 plus pulses throughout, no edema, no cyanosis no clubbing    EKG:  EKG is ordered today. The ekg ordered today demonstrates sinus rhythm, rate 65, axis within normal limits, intervals within normal limits, no  acute ST-T wave changes.   Recent Labs: 03/01/2017: ALT 17; BUN 8; Creatinine, Ser 0.72; Hemoglobin 12.7; Platelets 321.0; Potassium 4.2; Sodium 139    Lipid Panel    Component Value Date/Time   CHOL 197 09/03/2016 0355   TRIG 54 09/03/2016 0355   HDL 75 09/03/2016 0355   CHOLHDL 2.6 09/03/2016 0355   VLDL 11 09/03/2016 0355   LDLCALC 111 (H) 09/03/2016 0355      Wt Readings from Last 3 Encounters:  12/31/17 120 lb (54.4 kg)  06/17/17 103 lb 3.2 oz (46.8 kg)  04/20/17 108 lb (49 kg)      Other studies Reviewed: Additional studies/ records that were reviewed today include:  None. Review of the above records demonstrates:  Please see elsewhere in the note.     ASSESSMENT AND PLAN:   CAD S/P percutaneous coronary angioplasty The patient has no new sypmtoms.  No further cardiovascular testing is indicated.  We will continue with aggressive risk reduction and meds as listed.  Angina pectoris ([place]) Possibly Prinzmetal's angina.  She has had no further chest pain.    MS (multiple sclerosis) ([place]) Since 1997- MRI 2015 stable by her report.   Dyslipidemia LDL 111 Sept 2017  Palpitations Pt complaining of hard heart beats that have gotten so bad she has stopped driving.  She will need to buy an Alive Cor  Tobacco abuse Pt says she quit as of Nov 2017  Dizziness She is going to get carotid Doppler  AAA She does not have this diagnosis  but she has an abnormal exam as above.  She has family history and smoking history.  I will order an ultrasound   Current medicines are reviewed at length with the patient today.  The patient does not have concerns regarding medicines.  The following changes have been made:  no change  Labs/ tests ordered today include:   Orders Placed This Encounter  Procedures  . EKG 12-Lead     Disposition:   FU with me in six months.     Signed, Minus Breeding, MD  12/31/2017 2:20 PM    [facility]

## 2017-12-31 ENCOUNTER — Ambulatory Visit (INDEPENDENT_AMBULATORY_CARE_PROVIDER_SITE_OTHER): Payer: PRIVATE HEALTH INSURANCE | Admitting: Cardiology

## 2017-12-31 ENCOUNTER — Encounter: Payer: Self-pay | Admitting: Cardiology

## 2017-12-31 VITALS — BP 120/74 | HR 65 | Ht 62.0 in | Wt 120.0 lb

## 2017-12-31 DIAGNOSIS — R42 Dizziness and giddiness: Secondary | ICD-10-CM

## 2017-12-31 DIAGNOSIS — I2510 Atherosclerotic heart disease of native coronary artery without angina pectoris: Secondary | ICD-10-CM

## 2017-12-31 DIAGNOSIS — I729 Aneurysm of unspecified site: Secondary | ICD-10-CM | POA: Diagnosis not present

## 2017-12-31 NOTE — Patient Instructions (Addendum)
Medication Instructions:  Continue current medications  If you need a refill on your cardiac medications before your next appointment, please call your pharmacy.  Labwork: None Ordered HERE IN OUR OFFICE AT [facility]  Take the provided lab slips for you to take with you to the lab for you blood draw.   You will need to fast. DO NOT EAT OR DRINK PAST MIDNIGHT.   You will NOT need to fast   You may go to any [facility] lab that is convenient for you however, we do have a lab in our office that is able to assist you. You do NOT need an appointment for our lab. Once in our office lobby there is a podium to the right of the check-in desk where you are to sign-in and ring a doorbell to alert [place] you are here. Lab is open Monday-Friday from 8:00am to 4:00pm; and is closed for lunch from 12:45p-1:45pm   Testing/Procedures: Your physician has requested that you have an abdominal aorta duplex. During this test, an ultrasound is used to evaluate the aorta. Allow 30 minutes for this exam. Do not eat after midnight the day before and avoid carbonated beverages  Your physician has requested that you have a carotid duplex. This test is an ultrasound of the carotid arteries in your neck. It looks at blood flow through these arteries that supply the brain with blood. Allow one hour for this exam. There are no restrictions or special instructions.  Special Instructions:  ALIVECOR  Follow-Up: Your physician wants you to follow-up in: 6 Months. You should receive a reminder letter in the mail two months in advance. If you do not receive a letter, please call our office [phone].    Thank you for choosing [facility] at [facility]!!

## 2018-01-03 ENCOUNTER — Telehealth: Payer: Self-pay

## 2018-01-03 NOTE — Telephone Encounter (Signed)
-----   Message from [name], MD sent at 01/03/2018  9:35 AM EST ----- Crystal Clark, Possibly but this can be usually pretty hard to prove.  I have not seen her in several months and so we will get in touch with her to get her back in the office to discuss this, further testing for that if needed.  She also had numerous polyps on last colonoscopy and is a bit overdue for surveillance colonoscopy so we'll discuss that as well.  dj    [name], Can you call her to offer next available ROV with me.  Thanks  ----- Message ----- From: Minus Breeding, MD Sent: 12/31/2017  10:57 AM To: [name], MD  Hi,  Do you think her abdominal pain could possibly be intestinal ischemia?  Thanks.  Crystal Clark

## 2018-01-04 NOTE — Telephone Encounter (Signed)
Left message on machine to call back  

## 2018-01-05 NOTE — Telephone Encounter (Signed)
Left message on machine to call back  

## 2018-01-07 NOTE — Telephone Encounter (Signed)
Unable to reach pt appt sent to her via My Chart 03/02/18 at 3:45 pm

## 2018-01-20 ENCOUNTER — Ambulatory Visit (HOSPITAL_COMMUNITY): Payer: PRIVATE HEALTH INSURANCE

## 2018-01-25 DIAGNOSIS — M25551 Pain in right hip: Secondary | ICD-10-CM | POA: Diagnosis not present

## 2018-02-17 ENCOUNTER — Ambulatory Visit (HOSPITAL_COMMUNITY)
Admission: RE | Admit: 2018-02-17 | Discharge: 2018-02-17 | Disposition: A | Discharge status: Home / Self Care | Payer: PRIVATE HEALTH INSURANCE | Source: Ambulatory Visit | Attending: Cardiovascular Disease | Admitting: Cardiovascular Disease

## 2018-02-17 ENCOUNTER — Ambulatory Visit (HOSPITAL_BASED_OUTPATIENT_CLINIC_OR_DEPARTMENT_OTHER)
Admission: RE | Admit: 2018-02-17 | Discharge: 2018-02-17 | Disposition: A | Discharge status: Home / Self Care | Payer: PRIVATE HEALTH INSURANCE | Source: Ambulatory Visit | Attending: Cardiovascular Disease | Admitting: Cardiovascular Disease

## 2018-02-17 DIAGNOSIS — Z87891 Personal history of nicotine dependence: Secondary | ICD-10-CM | POA: Diagnosis not present

## 2018-02-17 DIAGNOSIS — R42 Dizziness and giddiness: Secondary | ICD-10-CM | POA: Insufficient documentation

## 2018-02-17 DIAGNOSIS — Z8249 Family history of ischemic heart disease and other diseases of the circulatory system: Secondary | ICD-10-CM | POA: Insufficient documentation

## 2018-02-17 DIAGNOSIS — I6523 Occlusion and stenosis of bilateral carotid arteries: Secondary | ICD-10-CM | POA: Diagnosis not present

## 2018-02-17 DIAGNOSIS — I729 Aneurysm of unspecified site: Secondary | ICD-10-CM

## 2018-03-02 ENCOUNTER — Ambulatory Visit (INDEPENDENT_AMBULATORY_CARE_PROVIDER_SITE_OTHER): Payer: PRIVATE HEALTH INSURANCE | Admitting: Gastroenterology

## 2018-03-02 ENCOUNTER — Encounter: Payer: Self-pay | Admitting: Gastroenterology

## 2018-03-02 ENCOUNTER — Telehealth: Payer: Self-pay

## 2018-03-02 VITALS — BP 102/66 | HR 76 | Ht 62.0 in | Wt 121.0 lb

## 2018-03-02 DIAGNOSIS — R1031 Right lower quadrant pain: Secondary | ICD-10-CM

## 2018-03-02 DIAGNOSIS — D125 Benign neoplasm of sigmoid colon: Secondary | ICD-10-CM | POA: Diagnosis not present

## 2018-03-02 DIAGNOSIS — I2510 Atherosclerotic heart disease of native coronary artery without angina pectoris: Secondary | ICD-10-CM

## 2018-03-02 MED ORDER — PEG 3350-KCL-NABCB-NACL-NASULF 236 G PO SOLR: 4000.0000 mL | Freq: Once | ORAL | 0 refills | Status: AC

## 2018-03-02 NOTE — Progress Notes (Signed)
Review of pertinent gastrointestinal problems: 1. Precancerous polyps: Colonoscopy May 2018 Dr. Ardis Hughs found 13 adenomatous polyps.  All were removed.  The largest was  Sessile, removed in piecemeal fashion with a hot snare.  It had a foci of high-grade dysplasia that did not involve the cautery margin.    I recommended repeat colonoscopy at 78-month interval 2. RLQ pain workup: 2018: CT scan abdomen and pelvis with IV and oral contrast March 2018 done for right lower quadrant pain.  No inflammatory process in the abdomen, ureters are normal.  There is a "low-lying cecum" no evidence of colitis.  No real explanation for her pains. Labs 02/2018 cbc, lipase, cmet all normal.    HPI: This is a very pleasant 62 year old woman whom I last saw May 2018 at the time of colonoscopy.  See those results summarized above  She is continuing to have severe intermittent right lower quadrant, right pelvic pains.  These occur about every 3 weeks.  They are not at all related to eating.  They are not at all related to moving her bowels.  When she has the pain she does not have any visible bulging in her right lower quadrant.  She has to take narcotic pain medicines during the pain episodes.  She has no nausea and vomiting at these times.  As workup for the pain she saw her neurologist; no new MS lesions.  Has been to [facility] for abd [place].  Apparently does not empty her bladder well, it to see a urologist for this.  Has gained 20 pounds in the past 2 months.  Has had GERD at night lately.  Vomits at night.    Takes protonix in AM wihtout eating, takes protonix at bedtime.   Endometriosis a long time ago.   Chief complaint is intermittent right lower quadrant, right pelvic pain; personal history of precancerous colon polyps  ROS: complete GI ROS as described in HPI, all other review negative.  Constitutional:  No unintentional weight loss   Past Medical History:  Diagnosis Date  . Anemia   . Chronic mid back pain     "T7-8 herniated disc that cannot be repaired"  . Coronary artery disease    a) s/p stent to LAD in 2006 in [place], [place]. b) unchanged cath 2013. c) tandem 70% lesions distal LAD with ISR s/p balloon angio and DES 07/2014. d) stable cath 08/2016 without obstructive lesion.  . Dyslipidemia   . GERD (gastroesophageal reflux disease)   . History of blood transfusion 1959  . MS (multiple sclerosis) ([place])    a) since 1997. b) cannot take Imdur due to medication interactions.  . Raynaud's disease dx'd 2005   bb contraindicated  . Tobacco use    30+ pack years    Past Surgical History:  Procedure Laterality Date  . CARDIAC CATHETERIZATION  12/2011  . CARDIAC CATHETERIZATION N/A 09/03/2016   Procedure: Left Heart Cath and Coronary Angiography;  Surgeon: Leonie Man, MD;  Location: [facility];  Service: Cardiovascular;  Laterality: N/A;  . CESAREAN SECTION  X 2  . CORONARY ANGIOPLASTY WITH STENT PLACEMENT  03/2005  . CORONARY ANGIOPLASTY WITH STENT PLACEMENT  07/18/2014   ISRS- Mid LAD: Promus Premier 2.75 x 16 mm DES   . KNEE ARTHROSCOPY Left   . LEFT HEART CATHETERIZATION WITH CORONARY ANGIOGRAM N/A 12/23/2011   Procedure: LEFT HEART CATHETERIZATION WITH CORONARY ANGIOGRAM;  Surgeon: Wellington Hampshire, MD;  Location: [facility] CATH LAB;  Service: Cardiovascular;  Laterality: N/A;  . LEFT HEART CATHETERIZATION WITH CORONARY ANGIOGRAM N/A 07/18/2014   Procedure: LEFT HEART CATHETERIZATION WITH CORONARY ANGIOGRAM;  Surgeon: Sinclair Grooms, MD;  Location: [facility] CATH LAB;  Service: Cardiovascular;  Laterality: N/A;    Current Outpatient Medications  Medication Sig Dispense Refill  . amLODipine (NORVASC) 5 MG tablet Take 1.5 tablets (7.5 mg total) by mouth daily. 135 tablet 3  . aspirin EC 81 MG tablet Take 81 mg by mouth daily.      Marland Kitchen atorvastatin (LIPITOR) 20 MG tablet Take 1 tablet (20 mg total) by mouth every evening. 90 tablet 3  . clopidogrel (PLAVIX) 75 MG tablet Take 1 tablet (75 mg total)  by mouth daily. 90 tablet 3  . ezetimibe (ZETIA) 10 MG tablet Take 1 tablet (10 mg total) by mouth daily. 30 tablet 6  . FLUoxetine (PROZAC) 20 MG tablet Take 20 mg by mouth daily.     Marland Kitchen HYDROcodone-acetaminophen (NORCO/VICODIN) 5-325 MG tablet Take 1 tablet by mouth every 6 (six) hours as needed for moderate pain.    Marland Kitchen lamoTRIgine (LAMICTAL) 25 MG tablet Take 50 mg by mouth 2 (two) times daily.    Marland Kitchen lidocaine (LIDODERM) 5 % Place 2 patches onto the skin daily as needed. For pain Remove & Discard patch within 12 hours or as directed by MD     . natalizumab (TYSABRI) 300 MG/15ML injection Inject 300 mg into the vein every 30 (thirty) days. Last given seven weeks ago from today(07-25-15)    . nitroGLYCERIN (NITROSTAT) 0.4 MG SL tablet Place 1 tablet (0.4 mg total) under the tongue every 5 (five) minutes as needed for chest pain. X 3 doses 25 tablet 3  . pantoprazole (PROTONIX) 40 MG tablet Take 1 tablet (40 mg total) by mouth 2 (two) times daily. 180 tablet 2  . pregabalin (LYRICA) 50 MG capsule Take 50 mg by mouth 2 (two) times daily.    . ranolazine (RANEXA) 500 MG 12 hr tablet Take 1 tablet (500 mg total) by mouth 2 (two) times daily. 180 tablet 3  . traMADol (ULTRAM) 50 MG tablet Take 50 mg by mouth every 6 (six) hours as needed for moderate pain.     . Vitamin D, Ergocalciferol, (DRISDOL) 50000 UNITS CAPS capsule Take 50,000 Units by mouth every Saturday.     . zolpidem (AMBIEN) 10 MG tablet Take 10 mg by mouth at bedtime.     Current Facility-Administered Medications  Medication Dose Route Frequency Provider Last Rate Last Dose  . 0.9 %  sodium chloride infusion  500 mL Intravenous Continuous [name], MD        Allergies as of 03/02/2018 - Review Complete 03/02/2018  Allergen Reaction Noted  . Morphine and related Other (See Comments) 04/17/2016  . Simvastatin Other (See Comments) 12/23/2011    Family History  Problem Relation Age of Onset  . Colon polyps Mother   .  Alzheimer's disease Mother   . Hypertension Mother   . Coronary artery disease Father        s/p CABG at 68  . Colon cancer Neg Hx   . Stomach cancer Neg Hx     Social History   Socioeconomic History  . Marital status: Married    Spouse name: Not on file  . Number of children: Not on file  . Years of education: Not on file  . Highest education level: Not on file  Social Needs  . Financial resource strain: Not on file  .  Food insecurity - worry: Not on file  . Food insecurity - inability: Not on file  . Transportation needs - medical: Not on file  . Transportation needs - non-medical: Not on file  Occupational History  . 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 Smoking status: Former Smoker    Packs/day: 0.50    Years: 32.00    Pack years: 16.00    Types: Cigars, Cigarettes    Last attempt to quit: 01/01/2017    Years since quitting: 1.1  . Smokeless tobacco: Never Used  Substance and Sexual Activity  . Alcohol use: No  . Drug use: No  . Sexual activity: Not on file  Other Topics Concern  . Not on file  Social History Narrative  . Not on file     Physical Exam: Ht 5\' 2"  (1.575 m)   Wt 121 lb (54.9 kg)   BMI 22.13 kg/m  Constitutional: generally well-appearing Psychiatric: alert and oriented x3 Abdomen: soft, nontender, nondistended, no obvious ascites, no peritoneal signs, normal bowel sounds No peripheral edema noted in lower extremities  Assessment and plan: 62 y.o. female with intermittent right lower quadrant, right pelvic pain, personal history of precancerous polyps  First she had multiple precancerous polyps removed less than a year ago.  1 of them was removed piecemeal fashion.  She needs repeat colonoscopy at her soonest convenience to check for new polyps, check the site of the piecemeal resected polyp.  She takes Plavix for coronary artery disease and we will confirm with her cardiologist that it is safe for her to  hold that Plavix for 5 days prior to the colonoscopy.  She has a very interesting right lower quadrant, right pelvic pain.  The pain started 1 year ago.  The pain comes on intermittently and last for 3-4 days or so.  Eating does not cause the pain, while she has the pains her bowels are not altered.  CAT scan and blood work at the outset of her pain a year ago were essentially normal however the radiologist did feel she had a "low-lying cecum".  I am not sure that she indeed has a low-lying cecum and if she does I am not sure that it is causing any of her intermittent symptoms.  Certainly cecal volvulus can happen and that can be quite painful but I would expect that if she was having intermittent volvulus she would have quite a bit of abdominal distention and the pain would become so severe that she would present to the emergency room for evaluation.  I do not think that these pains are related to ischemia.  Acute intestinal ischemia if it were to last for several days would almost invariably result in bowel necrosis.  Chronic intestinal ischemia is nearly always related to symptoms after eating which she has none.  She did have a history of endometriosis when she was young and so perhaps these are adhesive related pains.  I recommended that she call here at her next episode of severe right lower quadrant, right pelvic pain and at that time we would get expedited blood work including CBC and complete med about profile as well as a CT scan abdomen and pelvis.  Please see the "Patient Instructions" section for addition details about the plan.  Owens Loffler, MD [facility] 03/02/2018, 3:41 PM

## 2018-03-02 NOTE — Telephone Encounter (Deleted)
03/02/2018   RE: Crystal Clark DOB: 1956-02-15 MRN: [id number]      We have scheduled the above patient for an endoscopic procedure with Dr Ardis Hughs. Our records show that she is on anticoagulation therapy.   Please advise as to how long the patient may come off her therapy of plavix prior to the procedure, which is scheduled for 05/13/18.  Please fax back/ or route  to Keyaria Lawson/Patty taylor at [phone].   Sincerely,    Angie Fava

## 2018-03-02 NOTE — Patient Instructions (Addendum)
You should change the way you are taking your antiacid medicine (protonix) so that you are taking it 20-30 minutes prior to a decent meal as that is the way the pill is designed to work most effectively. CT scan abd/pelvis for RLQ pains and cbc, cmet the next time you have severe pains.  You have been scheduled for a CT scan of the abdomen and pelvis at [facility] ([address]---this is in the same building as Press [facility]).   You are scheduled on 03/04/18 at 1:30pm. You should arrive 15 minutes prior to your appointment time for registration. Please follow the written instructions below on the day of your exam:  WARNING: IF YOU ARE ALLERGIC TO IODINE/X-RAY DYE, PLEASE NOTIFY RADIOLOGY IMMEDIATELY AT [phone]! YOU WILL BE GIVEN A 13 HOUR PREMEDICATION PREP.  1) Do not eat or drink anything after 9:30am (4 hours prior to your test) 2) You have been given 2 bottles of oral contrast to drink. The solution may taste               better if refrigerated, but do NOT add ice or any other liquid to this solution. Shake             well before drinking.    Drink 1 bottle of contrast @ 11:30am (2 hours prior to your exam)  Drink 1 bottle of contrast @ 12:30pm (1 hour prior to your exam)  You may take any medications as prescribed with a small amount of water except for the following: Metformin, Glucophage, Glucovance, Avandamet, Riomet, Fortamet, Actoplus Met, Janumet, Glumetza or Metaglip. The above medications must be held the day of the exam AND 48 hours after the exam.  The purpose of you drinking the oral contrast is to aid in the visualization of your intestinal tract. The contrast solution may cause some diarrhea. Before your exam is started, you will be given a small amount of fluid to drink. Depending on your individual set of symptoms, you may also receive an intravenous injection of x-ray contrast/dye. Plan on being at [facility] for 30 minutes or longer,  depending on the type of exam you are having performed.  This test typically takes 30-45 minutes to complete.  If you have any questions regarding your exam or if you need to reschedule, you may call the CT department at [phone] between the hours of 8:00 am and 5:00 pm, Monday-Friday.  ________________________________________________________________________  Dennis Bast will be set up for a colonoscopy (polyps) [facility], holding plavix for 5 days.

## 2018-03-04 ENCOUNTER — Inpatient Hospital Stay: Admission: RE | Admit: 2018-03-04 | Payer: PRIVATE HEALTH INSURANCE | Source: Ambulatory Visit

## 2018-03-04 NOTE — Telephone Encounter (Signed)
[facility] Pre-operative Risk Assessment     Request for surgical clearance:     Endoscopy Procedure  What type of surgery is being performed?     colonoscopy  When is this surgery scheduled?     05/13/18  What type of clearance is required ?   Pharmacy  Are there any medications that need to be held prior to surgery and how long? Plavix, 5 days  Practice name and name of physician performing surgery?      [facility]  What is your office phone and fax number?      Phone- [phone]  [phone]  Anesthesia type (None, local, MAC, general) ?       MAC

## 2018-03-07 NOTE — Telephone Encounter (Signed)
OK to hold Plavix for this procedure.  Please resume afterward.

## 2018-03-07 NOTE — Telephone Encounter (Signed)
   Primary Cardiologist: Minus Breeding, MD  Chart reviewed as part of pre-operative protocol coverage. Left voice mail to call back between preop hours. Dr. Percival Spanish is it okay to hold plavix?  Please forward your response to P [facility] PREOP. Thank you  Leanor Kail, PA 03/07/2018, 1:28 PM

## 2018-03-16 NOTE — Telephone Encounter (Signed)
Notified and left several messages on patients home and cell phone regarding to hold Plavix for 05/13/18 procedure. She has not returned call as of this time.

## 2018-03-17 NOTE — Telephone Encounter (Signed)
Unable to reach patient by phone despite several attempts and voice messages left regarding Plavix hold for upcoming procedure. A letter was sent out today with  Instructions. She was asked to notify the office when she receives the letter. I will continue try to reach her by phone before her 05/13/18 colonoscopy.

## 2018-03-25 ENCOUNTER — Other Ambulatory Visit: Payer: Self-pay | Admitting: Cardiology

## 2018-03-30 NOTE — Progress Notes (Signed)
Noted that this has already been addressed by Dr. Percival Spanish.  Burtis Junes, RN, [place] [address] [place], [place]  [address] [phone]

## 2018-04-20 DIAGNOSIS — G43009 Migraine without aura, not intractable, without status migrainosus: Secondary | ICD-10-CM | POA: Diagnosis not present

## 2018-04-20 DIAGNOSIS — M5416 Radiculopathy, lumbar region: Secondary | ICD-10-CM | POA: Diagnosis not present

## 2018-04-20 DIAGNOSIS — G35 Multiple sclerosis: Secondary | ICD-10-CM | POA: Diagnosis not present

## 2018-04-20 DIAGNOSIS — J111 Influenza due to unidentified influenza virus with other respiratory manifestations: Secondary | ICD-10-CM | POA: Diagnosis not present

## 2018-05-11 ENCOUNTER — Telehealth: Payer: Self-pay

## 2018-05-11 NOTE — Telephone Encounter (Signed)
Good morning Dr. Ardis Hughs, This patient called to cx her procedure for Friday 05-11-18. She states her care partner had to go out of the state on a family emergency. She will call back to reschedule. Would you like to charge for late cancellation?

## 2018-05-11 NOTE — Telephone Encounter (Signed)
No charge.  Hopefully she will reschedule.

## 2018-05-13 ENCOUNTER — Encounter: Payer: PRIVATE HEALTH INSURANCE | Admitting: Gastroenterology

## 2018-05-31 DIAGNOSIS — H903 Sensorineural hearing loss, bilateral: Secondary | ICD-10-CM | POA: Diagnosis not present

## 2018-05-31 DIAGNOSIS — R490 Dysphonia: Secondary | ICD-10-CM | POA: Diagnosis not present

## 2018-05-31 DIAGNOSIS — H838X3 Other specified diseases of inner ear, bilateral: Secondary | ICD-10-CM | POA: Diagnosis not present

## 2018-05-31 DIAGNOSIS — H93299 Other abnormal auditory perceptions, unspecified ear: Secondary | ICD-10-CM | POA: Diagnosis not present

## 2018-05-31 DIAGNOSIS — R42 Dizziness and giddiness: Secondary | ICD-10-CM | POA: Diagnosis not present

## 2018-07-06 DIAGNOSIS — R350 Frequency of micturition: Secondary | ICD-10-CM | POA: Diagnosis not present

## 2019-01-18 ENCOUNTER — Ambulatory Visit
Admission: EM | Admit: 2019-01-18 | Discharge: 2019-01-18 | Disposition: A | Discharge status: Home / Self Care | Payer: PRIVATE HEALTH INSURANCE | Attending: Family Medicine | Admitting: Family Medicine

## 2019-01-18 DIAGNOSIS — R05 Cough: Secondary | ICD-10-CM | POA: Diagnosis not present

## 2019-01-18 DIAGNOSIS — R69 Illness, unspecified: Principal | ICD-10-CM

## 2019-01-18 DIAGNOSIS — R197 Diarrhea, unspecified: Secondary | ICD-10-CM | POA: Diagnosis not present

## 2019-01-18 DIAGNOSIS — R5383 Other fatigue: Secondary | ICD-10-CM

## 2019-01-18 DIAGNOSIS — R112 Nausea with vomiting, unspecified: Secondary | ICD-10-CM

## 2019-01-18 DIAGNOSIS — R509 Fever, unspecified: Secondary | ICD-10-CM

## 2019-01-18 DIAGNOSIS — R52 Pain, unspecified: Secondary | ICD-10-CM

## 2019-01-18 DIAGNOSIS — R0981 Nasal congestion: Secondary | ICD-10-CM | POA: Diagnosis not present

## 2019-01-18 DIAGNOSIS — R0982 Postnasal drip: Secondary | ICD-10-CM | POA: Diagnosis not present

## 2019-01-18 DIAGNOSIS — J111 Influenza due to unidentified influenza virus with other respiratory manifestations: Secondary | ICD-10-CM

## 2019-01-18 MED ORDER — ONDANSETRON 4 MG PO TBDP: 4.0000 mg | ORAL_TABLET | Freq: Three times a day (TID) | ORAL | 0 refills | Status: DC | PRN

## 2019-01-18 MED ORDER — BENZONATATE 100 MG PO CAPS: 100.0000 mg | ORAL_CAPSULE | Freq: Three times a day (TID) | ORAL | 0 refills | Status: DC

## 2019-01-18 NOTE — ED Triage Notes (Signed)
Pt states daughter dx with URI here on Saturday and she started having it on Sunday. C/o cough, nasal/head congestion with fever and vomiting since Sunday evening.

## 2019-01-18 NOTE — Discharge Instructions (Signed)

## 2019-01-19 NOTE — ED Provider Notes (Signed)
[facility]   [id number] 01/18/19 Arrival Time: 3614  ASSESSMENT & PLAN:  1. Influenza-like illness   2. Intractable vomiting with nausea, unspecified vomiting type   3. Diarrhea, unspecified type    See AVS for discharge instructions. No signs of dehydration requiring IVF at this time. Benign abdominal exam. No indication for urgent abdominal imaging. Discussed.  Meds ordered this encounter  Medications  . ondansetron (ZOFRAN-ODT) 4 MG disintegrating tablet    Sig: Take 1 tablet (4 mg total) by mouth every 8 (eight) hours as needed for nausea or vomiting.    Dispense:  15 tablet    Refill:  0  . benzonatate (TESSALON) 100 MG capsule    Sig: Take 1 capsule (100 mg total) by mouth every 8 (eight) hours.    Dispense:  21 capsule    Refill:  0   Discussed typical duration of symptoms. OTC symptom care as needed. Ensure adequate fluid intake and rest. May f/u with PCP or here as needed.  Reviewed expectations re: course of current medical issues. Questions answered. Outlined signs and symptoms indicating need for more acute intervention. Patient verbalized understanding. After Visit Summary given.   SUBJECTIVE: History from: patient.  RAELEE Clark is a 63 y.o. female who presents with complaint of nasal congestion, post-nasal drainage, and a persistent dry cough; without sore throat. Onset abrupt, about 3 days ago; with fatigue and with body aches. SOB: none. Wheezing: none. Fever: yes, subjective with chills. Overall decreased PO intake. For the past 1-2 days she has been having nausea with emesis; slight diarrhea; all non-bloody. Mild "cramping" of abdomen. No back pain. Known sick contacts: yes, daughter with the same symptoms. No specific or significant aggravating or alleviating factors reported. OTC treatment: analgesics with mild help.  Social History   Tobacco Use  Smoking Status Former Smoker  . Packs/day: 0.50  . Years: 32.00  . Pack years: 16.00  .  Types: Cigars, Cigarettes  . Last attempt to quit: 01/01/2017  . Years since quitting: 2.0  Smokeless Tobacco Never Used   ROS: As per HPI. All other systems negative.  OBJECTIVE:  Vitals:   01/18/19 1744  BP: 119/80  Pulse: 85  Resp: 18  Temp: 98 F (36.7 C)  TempSrc: Oral  SpO2: 98%    General appearance: alert; appears fatigued HEENT: moist; nasal congestion; clear runny nose; throat irritation secondary to post-nasal drainage Neck: supple without LAD CV: RRR Lungs: unlabored respirations, symmetrical air entry without wheezing; cough: moderate Abd: soft; non-tender; normal bowel sounds; no guarding or rebound tenderness Ext: no LE edema Skin: warm and dry Psychological: alert and cooperative; normal mood and affect   Allergies  Allergen Reactions  . Morphine And Related Other (See Comments)    Pt states "It increases my angina"  . Simvastatin Other (See Comments)    "makes MS worse"  . Prednisone     All steroids     Past Medical History:  Diagnosis Date  . Anemia   . Chronic mid back pain    "T7-8 herniated disc that cannot be repaired"  . Coronary artery disease    a) s/p stent to LAD in 2006 in [place], [place]. b) unchanged cath 2013. c) tandem 70% lesions distal LAD with ISR s/p balloon angio and DES 07/2014. d) stable cath 08/2016 without obstructive lesion.  . Dyslipidemia   . GERD (gastroesophageal reflux disease)   . History of blood transfusion 1959  . MS (multiple sclerosis) ([place])  a) since 1997. b) cannot take Imdur due to medication interactions.  . Raynaud's disease dx'd 2005   bb contraindicated  . Tobacco use    30+ pack years   Family History  Problem Relation Age of Onset  . Colon polyps Mother   . Alzheimer's disease Mother   . Hypertension Mother   . Coronary artery disease Father        s/p CABG at 5  . Colon cancer Neg Hx   . Stomach cancer Neg Hx    Social History   Socioeconomic History  . Marital status: Married     Spouse name: Not on file  . Number of children: Not on file  . Years of education: Not on file  . Highest education level: Not on file  Occupational History  . Occupation: disabled    Comment: Therapist, sports for [employer]  Social Needs  . Financial resource strain: Not on file  . Food insecurity:    Worry: Not on file    Inability: Not on file  . Transportation needs:    Medical: Not on file    Non-medical: Not on file  Tobacco Use  . Smoking status: Former Smoker    Packs/day: 0.50    Years: 32.00    Pack years: 16.00    Types: Cigars, Cigarettes    Last attempt to quit: 01/01/2017    Years since quitting: 2.0  . Smokeless tobacco: Never Used  Substance and Sexual Activity  . Alcohol use: No  . Drug use: No  . Sexual activity: Not on file  Lifestyle  . Physical activity:    Days per week: Not on file    Minutes per session: Not on file  . Stress: Not on file  Relationships  . Social connections:    Talks on phone: Not on file    Gets together: Not on file    Attends religious service: Not on file    Active member of club or organization: Not on file    Attends meetings of clubs or organizations: Not on file    Relationship status: Not on file  . Intimate partner violence:    Fear of current or ex partner: Not on file    Emotionally abused: Not on file    Physically abused: Not on file    Forced sexual activity: Not on file  Other Topics Concern  . Not on file  Social History Narrative  . Not on file           Vanessa Kick, MD 01/25/19 1134

## 2019-02-14 DIAGNOSIS — G43009 Migraine without aura, not intractable, without status migrainosus: Secondary | ICD-10-CM | POA: Diagnosis not present

## 2019-02-14 DIAGNOSIS — G40909 Epilepsy, unspecified, not intractable, without status epilepticus: Secondary | ICD-10-CM | POA: Diagnosis not present

## 2019-02-14 DIAGNOSIS — M5416 Radiculopathy, lumbar region: Secondary | ICD-10-CM | POA: Diagnosis not present

## 2019-02-14 DIAGNOSIS — G35 Multiple sclerosis: Secondary | ICD-10-CM | POA: Diagnosis not present

## 2019-04-19 DIAGNOSIS — G35 Multiple sclerosis: Secondary | ICD-10-CM | POA: Diagnosis not present

## 2019-04-21 ENCOUNTER — Telehealth: Payer: Self-pay | Admitting: Cardiology

## 2019-04-21 NOTE — Telephone Encounter (Signed)
LMTCB to schedule follow up appt with Dr. Warren Lacy.  Called patient from recall list.

## 2019-06-07 DIAGNOSIS — G4700 Insomnia, unspecified: Secondary | ICD-10-CM | POA: Diagnosis not present

## 2019-06-07 DIAGNOSIS — G35 Multiple sclerosis: Secondary | ICD-10-CM | POA: Diagnosis not present

## 2019-06-07 DIAGNOSIS — G40909 Epilepsy, unspecified, not intractable, without status epilepticus: Secondary | ICD-10-CM | POA: Diagnosis not present

## 2019-06-07 DIAGNOSIS — M5417 Radiculopathy, lumbosacral region: Secondary | ICD-10-CM | POA: Diagnosis not present

## 2019-06-14 ENCOUNTER — Telehealth: Payer: Self-pay | Admitting: *Deleted

## 2019-06-14 NOTE — Telephone Encounter (Signed)
A message was left, re: follow up visit. 

## 2019-06-27 ENCOUNTER — Telehealth: Payer: Self-pay | Admitting: Cardiology

## 2019-06-27 NOTE — Telephone Encounter (Signed)
LMTCB to schedu;e f/u appt with Dr. Percival Spanish. Openings for virtual visits on 7/16.

## 2019-08-01 ENCOUNTER — Other Ambulatory Visit: Payer: Self-pay

## 2019-08-01 DIAGNOSIS — Z20822 Contact with and (suspected) exposure to covid-19: Secondary | ICD-10-CM

## 2019-08-02 LAB — NOVEL CORONAVIRUS, NAA: SARS-CoV-2, NAA: NOT DETECTED

## 2019-09-01 ENCOUNTER — Ambulatory Visit
Admission: EM | Admit: 2019-09-01 | Discharge: 2019-09-01 | Disposition: A | Discharge status: Home / Self Care | Payer: PRIVATE HEALTH INSURANCE | Attending: Physician Assistant | Admitting: Physician Assistant

## 2019-09-01 ENCOUNTER — Other Ambulatory Visit: Payer: Self-pay

## 2019-09-01 DIAGNOSIS — B029 Zoster without complications: Secondary | ICD-10-CM | POA: Diagnosis not present

## 2019-09-01 MED ORDER — VALACYCLOVIR HCL 1 G PO TABS: 1000.0000 mg | ORAL_TABLET | Freq: Three times a day (TID) | ORAL | 0 refills | Status: AC

## 2019-09-01 MED ORDER — TRIAMCINOLONE ACETONIDE 0.5 % EX CREA: 1.0000 "application " | TOPICAL_CREAM | Freq: Two times a day (BID) | CUTANEOUS | 0 refills | Status: DC

## 2019-09-01 NOTE — ED Provider Notes (Signed)
[facility]    CSN: [id number] Arrival date & time: 09/01/19  1241      History   Chief Complaint Chief Complaint  Patient presents with  . Rash    HPI Crystal Clark is a 63 y.o. female.   63 yo female with history of MS presents with history of painful rash to her right side x 5 days. She reports some itching to the area prior to the rash eruption, now with itching, burning, and pain.  She had been doing yard work prior to the rash eruption, but reports she has never had a reaction to poison oak or ivy. She denies fever, chills, or spreading of the rash. Denies drainage or spreading erythema. Denies use of new products or food. She had varicella in childhood. She reports getting the shingles vaccine 7 years ago but does not recall getting the booster shot. She has tried benadryl and cortisone cream without relief.     Past Medical History:  Diagnosis Date  . Anemia   . Chronic mid back pain    "T7-8 herniated disc that cannot be repaired"  . Coronary artery disease    a) s/p stent to LAD in 2006 in [place], [place]. b) unchanged cath 2013. c) tandem 70% lesions distal LAD with ISR s/p balloon angio and DES 07/2014. d) stable cath 08/2016 without obstructive lesion.  . Dyslipidemia   . GERD (gastroesophageal reflux disease)   . History of blood transfusion 1959  . MS (multiple sclerosis) ([place])    a) since 1997. b) cannot take Imdur due to medication interactions.  . Raynaud's disease dx'd 2005   bb contraindicated  . Tobacco use    30+ pack years    Patient Active Problem List   Diagnosis Date Noted  . Palpitations 06/17/2017  . Raynaud's disease 09/03/2016  . Tobacco abuse 09/03/2016  . Angina pectoris ([place]) 09/02/2016  . CAD S/P percutaneous coronary angioplasty   . MS (multiple sclerosis) ([place])   . Dyslipidemia 12/23/2011    Past Surgical History:  Procedure Laterality Date  . CARDIAC CATHETERIZATION  12/2011  . CARDIAC CATHETERIZATION N/A  09/03/2016   Procedure: Left Heart Cath and Coronary Angiography;  Surgeon: Leonie Man, MD;  Location: [facility];  Service: Cardiovascular;  Laterality: N/A;  . CESAREAN SECTION  X 2  . CORONARY ANGIOPLASTY WITH STENT PLACEMENT  03/2005  . CORONARY ANGIOPLASTY WITH STENT PLACEMENT  07/18/2014   ISRS- Mid LAD: Promus Premier 2.75 x 16 mm DES   . KNEE ARTHROSCOPY Left   . LEFT HEART CATHETERIZATION WITH CORONARY ANGIOGRAM N/A 12/23/2011   Procedure: LEFT HEART CATHETERIZATION WITH CORONARY ANGIOGRAM;  Surgeon: Wellington Hampshire, MD;  Location: [facility];  Service: Cardiovascular;  Laterality: N/A;  . LEFT HEART CATHETERIZATION WITH CORONARY ANGIOGRAM N/A 07/18/2014   Procedure: LEFT HEART CATHETERIZATION WITH CORONARY ANGIOGRAM;  Surgeon: Sinclair Grooms, MD;  Location: [facility];  Service: Cardiovascular;  Laterality: N/A;    OB History   No obstetric history on file.      Home Medications    Prior to Admission medications   Medication Sig Start Date End Date Taking? Authorizing Provider  amLODipine (NORVASC) 5 MG tablet Take 1.5 tablets (7.5 mg total) by mouth daily. 06/17/17   Erlene Quan, PA-C  aspirin EC 81 MG tablet Take 81 mg by mouth daily.      [provider]  atorvastatin (LIPITOR) 20 MG tablet Take 1  tablet (20 mg total) by mouth every evening. 06/17/17   Erlene Quan, PA-C  benzonatate (TESSALON) 100 MG capsule Take 1 capsule (100 mg total) by mouth every 8 (eight) hours. 01/18/19   Vanessa Kick, MD  clopidogrel (PLAVIX) 75 MG tablet Take 1 tablet (75 mg total) by mouth daily. 06/17/17   Erlene Quan, PA-C  ezetimibe (ZETIA) 10 MG tablet TAKE 1 TABLET BY MOUTH  DAILY 03/25/18   Minus Breeding, MD  FLUoxetine (PROZAC) 20 MG tablet Take 20 mg by mouth daily.  05/13/14   [provider]  HYDROcodone-acetaminophen (NORCO/VICODIN) 5-325 MG tablet Take 1 tablet by mouth every 6 (six) hours as needed for moderate pain.    [provider]   lamoTRIgine (LAMICTAL) 25 MG tablet Take 50 mg by mouth 2 (two) times daily.    [provider]  lidocaine (LIDODERM) 5 % Place 2 patches onto the skin daily as needed. For pain Remove & Discard patch within 12 hours or as directed by MD     [provider]  natalizumab (TYSABRI) 300 MG/15ML injection Inject 300 mg into the vein every 30 (thirty) days. Last given seven weeks ago from today(07-25-15)    [provider]  nitroGLYCERIN (NITROSTAT) 0.4 MG SL tablet DISSOLVE 1 TABLET UNDER THE TONGUE AS DIRECTED (NOT TO  EXCEED 3 TABLETS IN 15  MINUTES, CALL 911 IF CHEST  PAIN PERSISTS) 03/25/18   Minus Breeding, MD  ondansetron (ZOFRAN-ODT) 4 MG disintegrating tablet Take 1 tablet (4 mg total) by mouth every 8 (eight) hours as needed for nausea or vomiting. 01/18/19   Vanessa Kick, MD  pantoprazole (PROTONIX) 40 MG tablet Take 1 tablet (40 mg total) by mouth 2 (two) times daily. 04/01/17   Levin Erp, PA  pregabalin (LYRICA) 50 MG capsule Take 50 mg by mouth 2 (two) times daily.    [provider]  ranolazine (RANEXA) 500 MG 12 hr tablet Take 1 tablet (500 mg total) by mouth 2 (two) times daily. 06/17/17   Erlene Quan, PA-C  traMADol (ULTRAM) 50 MG tablet Take 50 mg by mouth every 6 (six) hours as needed for moderate pain.     [provider]  triamcinolone cream (KENALOG) 0.5 % Apply 1 application topically 2 (two) times daily. 09/01/19   Tasia Catchings, Amy V, PA-C  valACYclovir (VALTREX) 1000 MG tablet Take 1 tablet (1,000 mg total) by mouth 3 (three) times daily for 7 days. 09/01/19 09/08/19  Ok Edwards, PA-C  Vitamin D, Ergocalciferol, (DRISDOL) 50000 UNITS CAPS capsule Take 50,000 Units by mouth every Saturday.  05/13/14   [provider]  zolpidem (AMBIEN) 10 MG tablet Take 10 mg by mouth at bedtime.    [provider]    Family History Family History  Problem Relation Age of Onset  . Colon polyps Mother   . Alzheimer's disease Mother    . Hypertension Mother   . Coronary artery disease Father        s/p CABG at 16  . Colon cancer Neg Hx   . Stomach cancer Neg Hx     Social History Social History   Tobacco Use  . Smoking status: Former Smoker    Packs/day: 0.50    Years: 32.00    Pack years: 16.00    Types: Cigars, Cigarettes    Quit date: 01/01/2017    Years since quitting: 2.6  . Smokeless tobacco: Never Used  Substance Use Topics  . Alcohol use: No  .  Drug use: No     Allergies   Morphine and related, Simvastatin, and Prednisone   Review of Systems Review of Systems  See HPI.    Physical Exam Triage Vital Signs ED Triage Vitals  Enc Vitals Group     BP 09/01/19 1251 (!) 147/85     Pulse Rate 09/01/19 1251 61     Resp 09/01/19 1251 16     Temp 09/01/19 1251 97.9 F (36.6 C)     Temp Source 09/01/19 1251 Oral     SpO2 09/01/19 1251 98 %     Weight --      Height --      Head Circumference --      Peak Flow --      Pain Score 09/01/19 1253 4     Pain Loc --      Pain Edu? --      Excl. in [place]? --    No data found.  Updated Vital Signs BP (!) 147/85 (BP Location: Right Arm)   Pulse 61   Temp 97.9 F (36.6 C) (Oral)   Resp 16   SpO2 98%    Physical Exam Constitutional:      General: She is not in acute distress.    Appearance: Normal appearance. She is not ill-appearing, toxic-appearing or diaphoretic.  HENT:     Head: Normocephalic and atraumatic.  Pulmonary:     Effort: Pulmonary effort is normal. No respiratory distress.  Skin:    Comments: Vesicular rash to the right thoracic back inferior to the scapula and along right lateral trunk, following one dermatome. Surrounding erythema. Does not cross midline. No warmth. Tenderness to palpation. Sensation intact. Vesicles intact without crusting or drainage.  Neurological:     Mental Status: She is alert and oriented to person, place, and time.     Gait: Gait normal.  Psychiatric:        Mood and Affect: Mood normal.         Behavior: Behavior normal.      UC Treatments / Results  Labs (all labs ordered are listed, but only abnormal results are displayed) Labs Reviewed - No data to display  EKG   Radiology No results found.  Procedures Procedures (including critical care time)  Medications Ordered in UC Medications - No data to display  Initial Impression / Assessment and Plan / UC Course  I have reviewed the triage vital signs and the nursing notes.  Pertinent labs & imaging results that were available during my care of the patient were reviewed by me and considered in my medical decision making (see chart for details).   Suspicion for zoster given painful vesicular rash along dermatone. Valtrex ordered. Patient unable to tolerated PO steroids but has tolerated topical steroids in the past, will provide trimcinolone 0.5% for symptomatic relief. Discussed follow up if no improvement in one week. Return precautions given.   Final Clinical Impressions(s) / UC Diagnoses   Final diagnoses:  Herpes zoster without complication   Discharge Instructions   None    ED Prescriptions    Medication Sig Dispense Auth. Provider   valACYclovir (VALTREX) 1000 MG tablet Take 1 tablet (1,000 mg total) by mouth 3 (three) times daily for 7 days. 21 tablet Yu, Amy V, PA-C   triamcinolone cream (KENALOG) 0.5 % Apply 1 application topically 2 (two) times daily. 30 g Ok Edwards, PA-C     PDMP not reviewed this encounter.   Ok Edwards, PA-C  09/01/19 1502  

## 2019-09-01 NOTE — ED Triage Notes (Signed)
Patient complains of rash under right arm, burning sensation and itchy - onset after [place] her [place] to burn X 4-5 days.

## 2019-10-10 DIAGNOSIS — G43009 Migraine without aura, not intractable, without status migrainosus: Secondary | ICD-10-CM | POA: Diagnosis not present

## 2019-10-10 DIAGNOSIS — J111 Influenza due to unidentified influenza virus with other respiratory manifestations: Secondary | ICD-10-CM | POA: Diagnosis not present

## 2019-10-10 DIAGNOSIS — G35 Multiple sclerosis: Secondary | ICD-10-CM | POA: Diagnosis not present

## 2019-10-10 DIAGNOSIS — M5416 Radiculopathy, lumbar region: Secondary | ICD-10-CM | POA: Diagnosis not present

## 2020-02-13 DIAGNOSIS — M5416 Radiculopathy, lumbar region: Secondary | ICD-10-CM | POA: Diagnosis not present

## 2020-02-13 DIAGNOSIS — G35 Multiple sclerosis: Secondary | ICD-10-CM | POA: Diagnosis not present

## 2020-02-13 DIAGNOSIS — J111 Influenza due to unidentified influenza virus with other respiratory manifestations: Secondary | ICD-10-CM | POA: Diagnosis not present

## 2020-02-13 DIAGNOSIS — G43009 Migraine without aura, not intractable, without status migrainosus: Secondary | ICD-10-CM | POA: Diagnosis not present

## 2020-12-24 DIAGNOSIS — Z79899 Other long term (current) drug therapy: Secondary | ICD-10-CM | POA: Diagnosis not present

## 2020-12-24 DIAGNOSIS — R202 Paresthesia of skin: Secondary | ICD-10-CM | POA: Diagnosis not present

## 2020-12-24 DIAGNOSIS — G35 Multiple sclerosis: Secondary | ICD-10-CM | POA: Diagnosis not present

## 2022-03-23 ENCOUNTER — Other Ambulatory Visit: Payer: Self-pay | Admitting: Specialist

## 2022-03-23 DIAGNOSIS — Q232 Congenital mitral stenosis: Secondary | ICD-10-CM

## 2022-05-05 ENCOUNTER — Ambulatory Visit
Admission: RE | Admit: 2022-05-05 | Discharge: 2022-05-05 | Disposition: A | Discharge status: Home / Self Care | Payer: 59 | Source: Ambulatory Visit | Attending: Specialist | Admitting: Specialist

## 2022-05-05 DIAGNOSIS — Q232 Congenital mitral stenosis: Secondary | ICD-10-CM

## 2022-05-05 MED ORDER — GADOBENATE DIMEGLUMINE 529 MG/ML IV SOLN
10.0000 mL | Freq: Once | INTRAVENOUS | Status: AC | PRN
  Administered 2022-05-05: 10 mL via INTRAVENOUS

## 2022-10-27 ENCOUNTER — Other Ambulatory Visit: Payer: Self-pay | Admitting: Specialist

## 2022-10-27 DIAGNOSIS — G35 Multiple sclerosis: Secondary | ICD-10-CM

## 2022-11-18 ENCOUNTER — Ambulatory Visit
Admission: RE | Admit: 2022-11-18 | Discharge: 2022-11-18 | Disposition: A | Discharge status: Home / Self Care | Payer: Medicare Other | Source: Ambulatory Visit | Attending: Specialist | Admitting: Specialist

## 2022-11-18 DIAGNOSIS — G35 Multiple sclerosis: Secondary | ICD-10-CM

## 2022-11-18 MED ORDER — GADOPICLENOL 0.5 MMOL/ML IV SOLN
5.0000 mL | Freq: Once | INTRAVENOUS | Status: AC | PRN
  Administered 2022-11-18: 5 mL via INTRAVENOUS

## 2023-02-24 IMAGING — MR MR HEAD WO/W CM
13 series · 48 of 48 positions shown · IV contrast (multihance)
Comparison: 08/09/2014

CLINICAL DATA: Follow-up MS

EXAM:
MRI HEAD WITHOUT AND WITH CONTRAST
TECHNIQUE: Multiplanar, multiecho pulse sequences of the brain and surrounding
structures were obtained without and with intravenous contrast.
CONTRAST:  10mL MULTIHANCE GADOBENATE DIMEGLUMINE 529 MG/ML IV SOLN

[Series 2: T1 · sagittal · 5.0mm · 0.45mm/px · 1 of 25 slices shown]
[im 1/25]
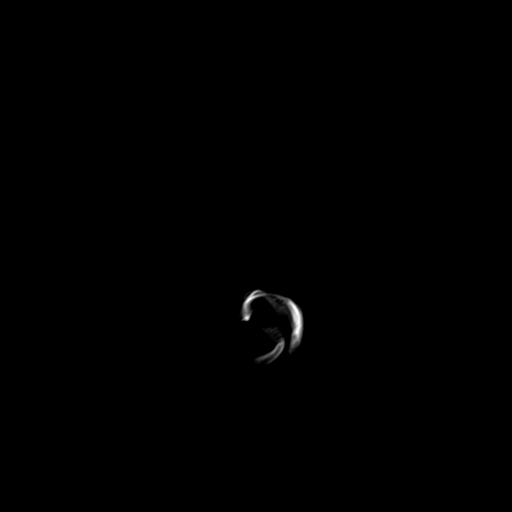

[Series 3: FLAIR · sagittal · 3.0mm · 0.43mm/px · 1 of 40 slices shown (1 of 2)]
[im 1/40]
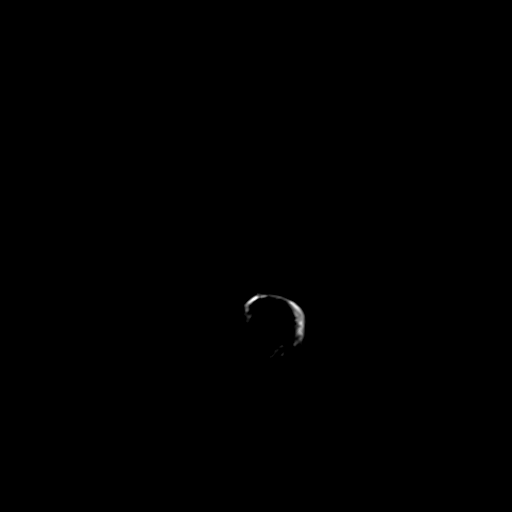

[Series 4: ax ep2d_diff_3 · axial · 3.0mm · 1.80mm/px · z∈[-35,+118]mm · 6 of 108 slices shown]
[im 1/108]
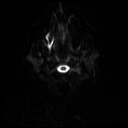
[im 22/108]
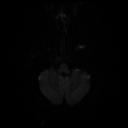
[im 43/108]
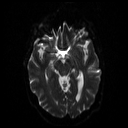
[im 65/108]
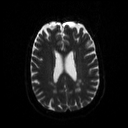
[im 86/108]
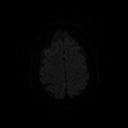
[im 108/108]
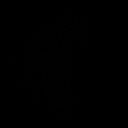

[Series 5: ax ep2d_diff_3_adc · axial · 3.0mm · 1.80mm/px · z∈[-35,+115]mm · 3 of 54 slices shown]
[im 1/54]
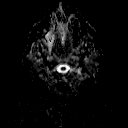
[im 27/54]
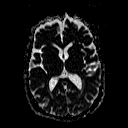
[im 54/54]
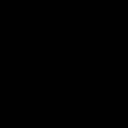

[Series 6: cor ep2d_diff · coronal · 5.0mm · 1.77mm/px · 4 of 59 slices shown]
[im 1/59]
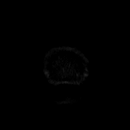
[im 20/59]
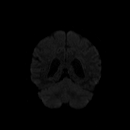
[im 39/59]
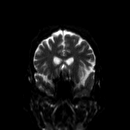
[im 59/59]
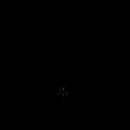

[Series 7: cor ep2d_diff_adc · coronal · 5.0mm · 1.77mm/px · 2 of 30 slices shown]
[im 1/30]
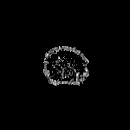
[im 30/30]
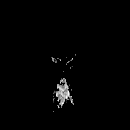

[Series 9: swi_images · axial · 2.0mm · 0.98mm/px · z∈[-30,+119]mm · 5 of 80 slices shown]
[im 1/80]
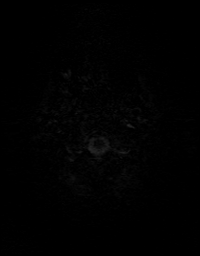
[im 20/80]
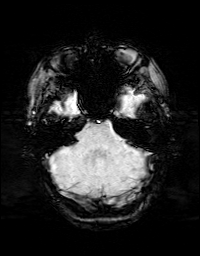
[im 40/80]
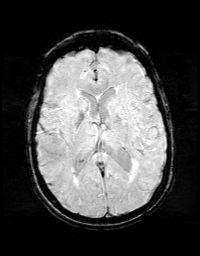
[im 60/80]
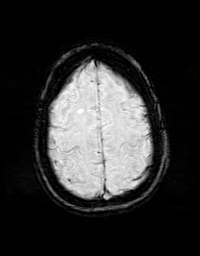
[im 80/80]
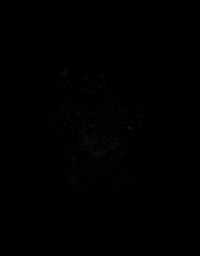

[Series 10: FLAIR · axial · 3.0mm · 0.43mm/px · z∈[-32,+112]mm · 2 of 40 slices shown (2 of 2)]
[im 1/40]
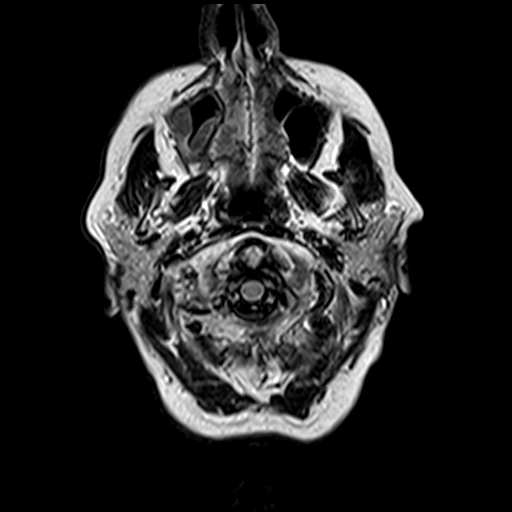
[im 40/40]
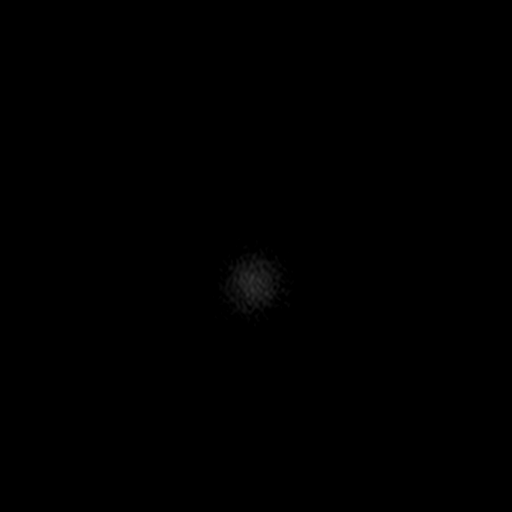

[Series 11: T2 · axial · 5.0mm · 0.65mm/px · z∈[-29,+119]mm · 2 of 27 slices shown (1 of 2)]
[im 1/27]
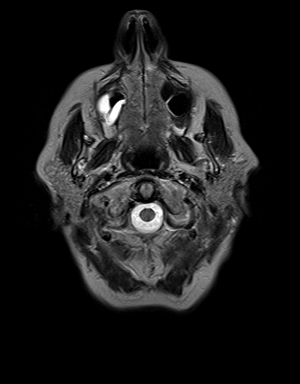
[im 27/27]
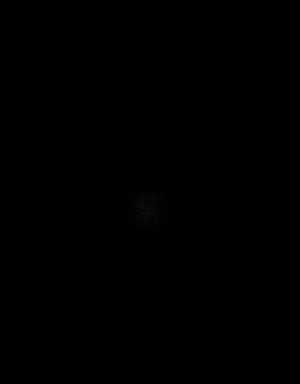

[Series 12: t1_mpr_tra · axial · 1.0mm · 0.72mm/px · z∈[-26,+109]mm · 9 of 144 slices shown]
[im 1/144]
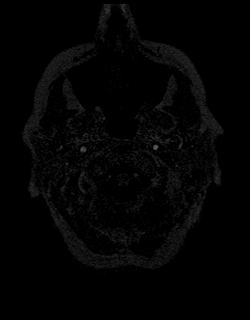
[im 18/144]
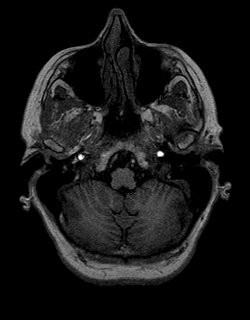
[im 36/144]
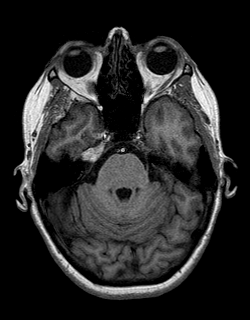
[im 54/144]
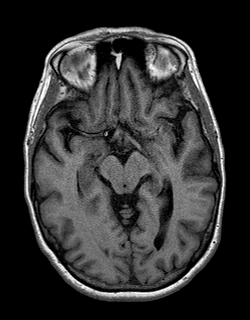
[im 72/144]
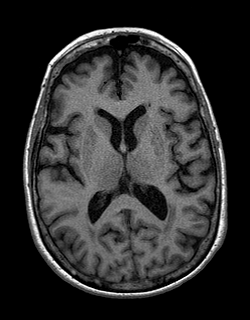
[im 90/144]
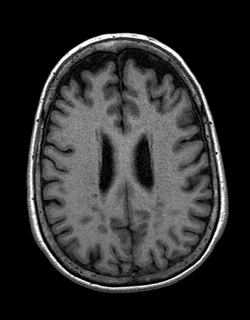
[im 108/144]
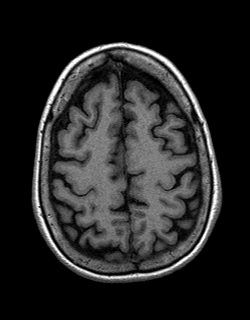
[im 126/144]
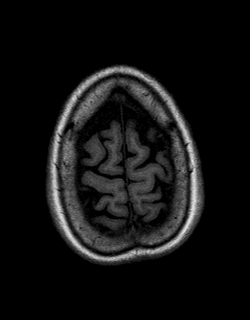
[im 144/144]
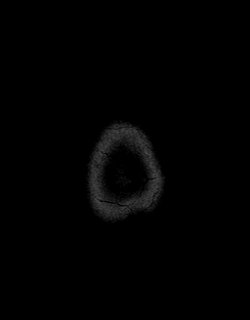

[Series 13: T2 · coronal · 5.0mm · 0.43mm/px · 2 of 28 slices shown (2 of 2)]
[im 1/28]
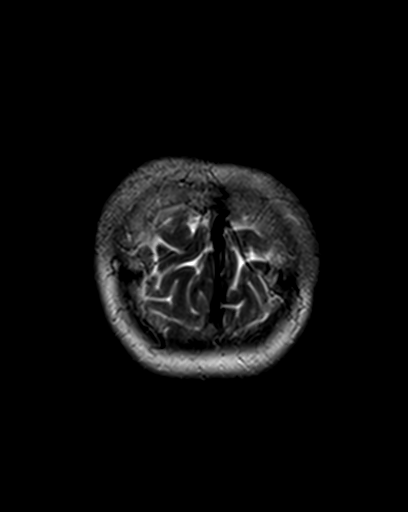
[im 28/28]
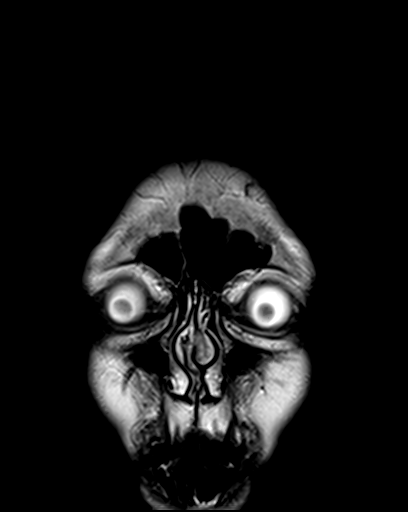

[Series 14: post t1_mpr_tra · axial · 1.0mm · 0.72mm/px · z∈[-26,+109]mm · 9 of 144 slices shown]
[im 1/144]
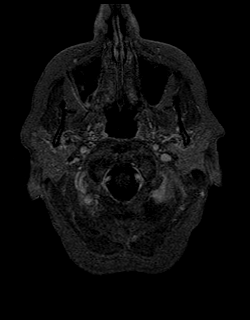
[im 18/144]
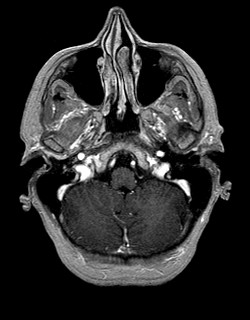
[im 36/144]
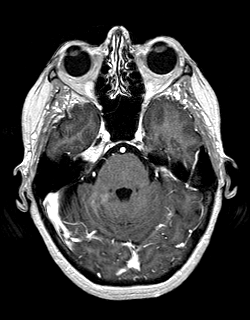
[im 54/144]
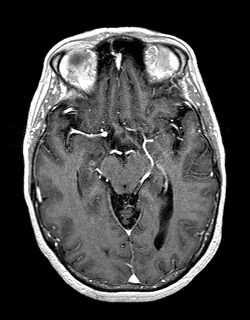
[im 72/144]
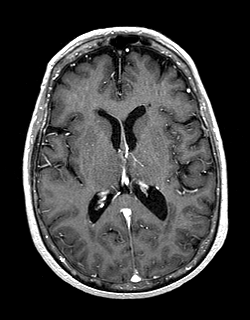
[im 90/144]
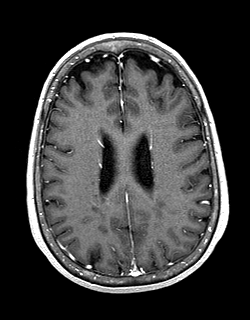
[im 108/144]
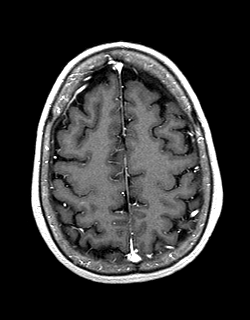
[im 126/144]
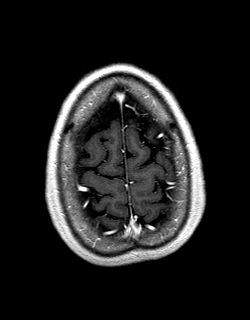
[im 144/144]
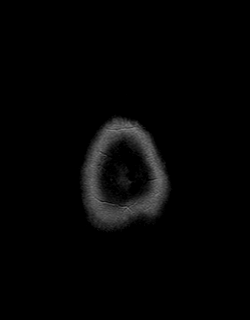

[Series 15: T1 post-contrast · coronal · 5.0mm · 0.72mm/px · 2 of 28 slices shown]
[im 1/28]
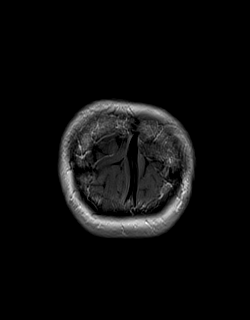
[im 28/28]
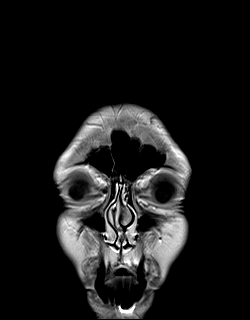

[48 of 48 positions shown; findings below may reference images not displayed]

FINDINGS: Brain: No restricted diffusion to suggest acute or subacute infarct.
No acute hemorrhage, mass, mass effect, or midline shift. No
abnormal parenchymal or meningeal enhancement. No hydrocephalus or
extra-axial collection.

Several new T2 hyperintense lesions in the posterior periventricular
white matter (series 10, image 23). Some pre-existing foci have
slightly increased in size, which may be due to differences in
scanner technique. New T2 hyperintense focus in the left cerebellum
(series 10, image 10). No enhancing lesions.

Vascular: Normal flow voids.  Normal vascular enhancement.

Skull and upper cervical spine: Normal marrow signal.

Sinuses/Orbits: Mucosal thickening in the right maxillary sinus. The
orbits are unremarkable.

Other: The mastoids are well aerated.
IMPRESSION: 1. Several new T2 hyperintense lesions in the posterior
periventricular white matter and left cerebellum compared to 6295,
without enhancement to suggest active demyelination. Some previously
noted lesions appear to have increased in size, which may be due to
differences in scan technique rather than enlarging lesions.
2.  No acute intracranial process.

## 2023-05-25 ENCOUNTER — Encounter: Payer: Self-pay | Admitting: Nurse Practitioner

## 2023-05-25 ENCOUNTER — Ambulatory Visit (INDEPENDENT_AMBULATORY_CARE_PROVIDER_SITE_OTHER): Payer: Medicare Other | Admitting: Nurse Practitioner

## 2023-05-25 VITALS — BP 110/80 | HR 78 | Temp 97.2°F | Ht 62.0 in | Wt 100.2 lb

## 2023-05-25 DIAGNOSIS — R569 Unspecified convulsions: Secondary | ICD-10-CM

## 2023-05-25 DIAGNOSIS — F339 Major depressive disorder, recurrent, unspecified: Secondary | ICD-10-CM

## 2023-05-25 DIAGNOSIS — G8929 Other chronic pain: Secondary | ICD-10-CM | POA: Diagnosis not present

## 2023-05-25 DIAGNOSIS — M546 Pain in thoracic spine: Secondary | ICD-10-CM

## 2023-05-25 DIAGNOSIS — I209 Angina pectoris, unspecified: Secondary | ICD-10-CM

## 2023-05-25 DIAGNOSIS — E785 Hyperlipidemia, unspecified: Secondary | ICD-10-CM

## 2023-05-25 DIAGNOSIS — L821 Other seborrheic keratosis: Secondary | ICD-10-CM | POA: Diagnosis not present

## 2023-05-25 DIAGNOSIS — Z9861 Coronary angioplasty status: Secondary | ICD-10-CM

## 2023-05-25 DIAGNOSIS — G35 Multiple sclerosis: Secondary | ICD-10-CM

## 2023-05-25 DIAGNOSIS — I7300 Raynaud's syndrome without gangrene: Secondary | ICD-10-CM

## 2023-05-25 DIAGNOSIS — J432 Centrilobular emphysema: Secondary | ICD-10-CM | POA: Insufficient documentation

## 2023-05-25 DIAGNOSIS — M25551 Pain in right hip: Secondary | ICD-10-CM | POA: Diagnosis not present

## 2023-05-25 DIAGNOSIS — F5101 Primary insomnia: Secondary | ICD-10-CM

## 2023-05-25 DIAGNOSIS — R6889 Other general symptoms and signs: Secondary | ICD-10-CM

## 2023-05-25 DIAGNOSIS — I2510 Atherosclerotic heart disease of native coronary artery without angina pectoris: Secondary | ICD-10-CM

## 2023-05-25 MED ORDER — FLUOXETINE HCL 20 MG PO TABS: 20.0000 mg | ORAL_TABLET | Freq: Every day | ORAL | 1 refills | Status: DC

## 2023-05-25 NOTE — Progress Notes (Unsigned)
New Patient Visit  BP 110/80 (BP Location: Right Arm)   Pulse 78   Temp (!) 97.2 F (36.2 C)   Ht 5\' 2"  (1.575 m)   Wt 100 lb 3.2 oz (45.5 kg)   SpO2 99%   BMI 18.33 kg/m    Subjective:    Patient ID: Crystal Clark, female    DOB: [birth year], 67 y.o.   MRN: [id number]  CC: Chief Complaint  Patient presents with   Establish Care    NP. Est. Care, concerns with a mole on right side of abdomen    HPI: Crystal Clark is a 67 y.o. female presents for new patient visit to establish care.  Introduced to Publishing rights manager role and practice setting.  All questions answered.  Discussed provider/patient relationship and expectations.  She has a history of MS. She has been on numerous medications for this throughout the years. She states that since January, she has had a decreased appetite, fatigue, and has lost weight. Her neurologist recently started her on B12 injections to see if this helps with the fatigue. She goes to [facility] and sees Dr. Estella Husk.   She has a history of Raynaud's disease. She is taking amlodipine daily.   She has constant pain in her right groin.  She states this has been going on for several years, however has flared up over the last week.  She states that she does have frequent falls from her MS.  She denies constipation, diarrhea, fevers.  She also notes that she has an uneven gait from her MS.  She tries to limit the pain medication that she takes.  She has a history of coronary artery disease. She had a stent placed in her LAD. Several years later, this needed to be re-stented. Since then, she has been doing well and denies shortness of breath and chest pain. She is currently taking atorvastatin 20mg , plavix 75mg , aspirin 81mg , zetia 10mg  daily, and ranexa 500mg  twice a day. She follows with cardiology.   She has a history of chronic back pain. She has a T7-T8 herniated disc. She tends to sit down and rest to relieve the pain and use lidoderm  patches. She does have tramadol and baclofen that she uses very rarely.   She has noticed that she has been more forgetful recently. She has been forgetting dates, addresses, things to that must be done, repeating conversations. She has noticed that she has been slightly more irritated since the beginning of the year. Her mother has a history of Alzheimer's but she is not ready to be tested yet.   She has a history of emphysema. She is taking fluticasone-umeclidin-vilant inhaler. Her symptoms are well controlled.   She has a history of seizures and is taking lamictal 50mg  twice a day.  She is following with neurology for this.  She has not had any seizures recently.  She noticed a mole on her right lower abdomen a few weeks ago.  She thought it was a tick at first as she gets these frequently being outside.  She has not noticed any changes in the size or color.  She has a history of depression. She has taken fluoxetine in the past which worked for her. She was taking this off an on through the years. [employer] tend to be worse, because she doesn't like to be inside. She takes ambien 10mg  at bedtime.   Depression and Anxiety Screen Done:     05/25/2023    8:14  AM  Depression screen PHQ 2/9  Decreased Interest 1  Down, Depressed, Hopeless 1  PHQ - 2 Score 2  Altered sleeping 2  Tired, decreased energy 3  Change in appetite 3  Feeling bad or failure about yourself  0  Trouble concentrating 0  Moving slowly or fidgety/restless 2  Suicidal thoughts 0  PHQ-9 Score 12  Difficult doing work/chores Somewhat difficult      05/25/2023    8:15 AM  GAD 7 : Generalized Anxiety Score  Nervous, Anxious, on Edge 0  Control/stop worrying 0  Worry too much - different things 2  Trouble relaxing 0  Restless 0  Easily annoyed or irritable 2  Afraid - awful might happen 0  Total GAD 7 Score 4  Anxiety Difficulty Somewhat difficult    Past Medical History:  Diagnosis Date   Anemia    Chronic  mid back pain    "T7-8 herniated disc that cannot be repaired"   Coronary artery disease    a) s/p stent to LAD in 2006 in [place], [place]. b) unchanged cath 2013. c) tandem 70% lesions distal LAD with ISR s/p balloon angio and DES 07/2014. d) stable cath 08/2016 without obstructive lesion.   Dyslipidemia    GERD (gastroesophageal reflux disease)    History of blood transfusion 1959   MS (multiple sclerosis) (HCC)    a) since 1997. b) cannot take Imdur due to medication interactions.   Raynaud's disease dx'd 2005   bb contraindicated   Tobacco use    30+ pack years    Past Surgical History:  Procedure Laterality Date   CARDIAC CATHETERIZATION  12/2011   CARDIAC CATHETERIZATION N/A 09/03/2016   Procedure: Left Heart Cath and Coronary Angiography;  Surgeon: Marykay Lex, MD;  Location: [facility] INVASIVE CV LAB;  Service: Cardiovascular;  Laterality: N/A;   CESAREAN SECTION  X 2   CORONARY ANGIOPLASTY WITH STENT PLACEMENT  03/2005   CORONARY ANGIOPLASTY WITH STENT PLACEMENT  07/18/2014   ISRS- Mid LAD: Promus Premier 2.75 x 16 mm DES    KNEE ARTHROSCOPY Left    LEFT HEART CATHETERIZATION WITH CORONARY ANGIOGRAM N/A 12/23/2011   Procedure: LEFT HEART CATHETERIZATION WITH CORONARY ANGIOGRAM;  Surgeon: Iran Ouch, MD;  Location: [facility] CATH LAB;  Service: Cardiovascular;  Laterality: N/A;   LEFT HEART CATHETERIZATION WITH CORONARY ANGIOGRAM N/A 07/18/2014   Procedure: LEFT HEART CATHETERIZATION WITH CORONARY ANGIOGRAM;  Surgeon: Lesleigh Noe, MD;  Location[facility] C[facility] LAB;  Service: Cardiovascular;  Laterality: N/A;    Family History  Problem Relation Age of Onset   Colon polyps Mother    Alzheimer's disease Mother    Hypertension Mother    Coronary artery disease Father        s/p CABG at 55   Colon cancer Neg Hx    Stomach cancer Neg Hx      Social History   Tobacco Use   Smoking status: Former    Packs/day: 0.50    Years: 32.00    Additional pack years: 0.00    Total pack years:  16.00    Types: Cigars, Cigarettes    Quit date: 01/01/2017    Years since quitting: 6.4   Smokeless tobacco: Never  Vaping Use   Vaping Use: Never used  Substance Use Topics   Alcohol use: No   Drug use: No    Current Outpatient Medications on File Prior to Visit  Medication Sig Dispense Refill   albuterol (VENTOLIN HFA)  108 (90 Base) MCG/ACT inhaler Inhale 1 puff into the lungs every 4 (four) hours as needed.     amLODipine (NORVASC) 2.5 MG tablet Take 2.5 mg by mouth daily.     aspirin EC 81 MG tablet Take 81 mg by mouth daily.       atorvastatin (LIPITOR) 20 MG tablet Take 1 tablet (20 mg total) by mouth every evening. 90 tablet 3   baclofen (LIORESAL) 10 MG tablet Take 10 mg by mouth as needed.     clopidogrel (PLAVIX) 75 MG tablet Take 1 tablet (75 mg total) by mouth daily. 90 tablet 3   cyanocobalamin (VITAMIN B12) 1000 MCG/ML injection Inject 1,000 mcg into the muscle once a week.     ezetimibe (ZETIA) 10 MG tablet TAKE 1 TABLET BY MOUTH  DAILY 30 tablet 6   Fluticasone-Umeclidin-Vilant 100-62.5-25 MCG/ACT AEPB Inhale into the lungs.     lamoTRIgine (LAMICTAL) 25 MG tablet Take 50 mg by mouth 2 (two) times daily.     lidocaine (LIDODERM) 5 % Place 2 patches onto the skin daily as needed. For pain Remove & Discard patch within 12 hours or as directed by MD      nitroGLYCERIN (NITROSTAT) 0.4 MG SL tablet DISSOLVE 1 TABLET UNDER THE TONGUE AS DIRECTED (NOT TO  EXCEED 3 TABLETS IN 15  MINUTES, CALL 911 IF CHEST  PAIN PERSISTS) 25 tablet 3   pantoprazole (PROTONIX) 40 MG tablet Take 1 tablet (40 mg total) by mouth 2 (two) times daily. 180 tablet 2   ranolazine (RANEXA) 500 MG 12 hr tablet Take 1 tablet (500 mg total) by mouth 2 (two) times daily. 180 tablet 3   traMADol (ULTRAM) 50 MG tablet Take 50 mg by mouth every 6 (six) hours as needed for moderate pain.      VUMERITY 231 MG CPDR Take 2 capsules by mouth 2 (two) times daily.     zolpidem (AMBIEN) 10 MG tablet Take 10 mg by mouth  at bedtime.     ondansetron (ZOFRAN-ODT) 4 MG disintegrating tablet Take 1 tablet (4 mg total) by mouth every 8 (eight) hours as needed for nausea or vomiting. (Patient not taking: Reported on 05/25/2023) 15 tablet 0   No current facility-administered medications on file prior to visit.     Review of Systems  Constitutional:  Positive for fatigue. Negative for fever.  HENT: Negative.    Eyes: Negative.   Respiratory: Negative.    Cardiovascular: Negative.   Gastrointestinal: Negative.   Endocrine: Negative.   Genitourinary:  Positive for frequency and urgency. Negative for dysuria.  Musculoskeletal:  Positive for arthralgias (right hip).  Skin: Negative.   Neurological: Negative.   Psychiatric/Behavioral:  Positive for dysphoric mood.         Objective:    BP 110/80 (BP Location: Right Arm)   Pulse 78   Temp (!) 97.2 F (36.2 C)   Ht 5\' 2"  (1.575 m)   Wt 100 lb 3.2 oz (45.5 kg)   SpO2 99%   BMI 18.33 kg/m   Wt Readings from Last 3 Encounters:  05/25/23 100 lb 3.2 oz (45.5 kg)  03/02/18 121 lb (54.9 kg)  12/31/17 120 lb (54.4 kg)    BP Readings from Last 3 Encounters:  05/25/23 110/80  09/01/19 (!) 147/85  01/18/19 119/80    Physical Exam Vitals and nursing note reviewed.  Constitutional:      General: She is not in acute distress.    Appearance: Normal appearance.  HENT:  Head: Normocephalic and atraumatic.     Right Ear: Tympanic membrane, ear canal and external ear normal.     Left Ear: Tympanic membrane, ear canal and external ear normal.  Eyes:     Conjunctiva/sclera: Conjunctivae normal.  Cardiovascular:     Rate and Rhythm: Normal rate and regular rhythm.     Pulses: Normal pulses.     Heart sounds: Normal heart sounds.  Pulmonary:     Effort: Pulmonary effort is normal.     Breath sounds: Normal breath sounds.  Abdominal:     Palpations: Abdomen is soft.     Tenderness: There is no abdominal tenderness.  Musculoskeletal:        General: No  tenderness. Normal range of motion.     Cervical back: Normal range of motion and neck supple.     Right lower leg: No edema.     Left lower leg: No edema.  Lymphadenopathy:     Cervical: No cervical adenopathy.  Skin:    General: Skin is warm and dry.  Neurological:     General: No focal deficit present.     Mental Status: She is alert and oriented to person, place, and time.     Coordination: Coordination normal.  Psychiatric:        Mood and Affect: Mood normal.        Behavior: Behavior normal.        Thought Content: Thought content normal.        Judgment: Judgment normal.        Assessment & Plan:   Problem List Items Addressed This Visit       Cardiovascular and Mediastinum   CAD S/P percutaneous coronary angioplasty (Chronic)    She has a history of multiple PCI's.  She is following with cardiology.  Continue Plavix 75 mg daily, aspirin 81 mg daily, atorvastatin 20 mg daily, Setia 10 mg daily.      Relevant Medications   amLODipine (NORVASC) 2.5 MG tablet   Angina pectoris (HCC)    Chronic, stable.  She is currently following with cardiology.  Continue Ranexa 500 mg twice a day.      Relevant Medications   amLODipine (NORVASC) 2.5 MG tablet   Raynaud's disease - Primary    Chronic, stable.  Continue amlodipine 2.5 mg daily.      Relevant Medications   amLODipine (NORVASC) 2.5 MG tablet     Respiratory   Centrilobular emphysema (HCC)    Chronic, stable.  She is currently taking Trelegy inhaler daily.  Continue this regimen as her symptoms are well controlled.      Relevant Medications   Fluticasone-Umeclidin-Vilant 100-62.5-25 MCG/ACT AEPB   albuterol (VENTOLIN HFA) 108 (90 Base) MCG/ACT inhaler     Nervous and Auditory   MS (multiple sclerosis) (HCC)    Chronic, ongoing.  She has been having some ongoing fatigue and falls since January.  Unsure if this is related to her MS.  She is currently following with neurology regularly.  She is taking Vumerity  twice a day.  She had labs done recently, requesting recent lab work and progress notes from neurology.  Follow-up in 4 weeks.      Relevant Medications   VUMERITY 231 MG CPDR     Musculoskeletal and Integument   Seborrheic keratosis    Noted to right lower abdomen.  Reassured patient.        Other   Dyslipidemia    Chronic, stable.  Continue atorvastatin 20  mg daily and Zetia 10 mg daily.      Depression, recurrent (HCC)    Chronic, not controlled.  She has been taking fluoxetine 20 mg off and on throughout the years for depression.  She has been having some ongoing fatigue, lack of interest in things over the last 6 months.  May be related to MS versus depression.  Will have her restart fluoxetine 20 mg daily.  Follow-up in 4 weeks.      Relevant Medications   FLUoxetine (PROZAC) 20 MG tablet   Chronic right hip pain    She has been experiencing right hip pain for the last several years, however is flared up over the last couple of weeks.  She has had multiple falls recently.  Will check an x-ray of her right hip.  She can continue to take Tylenol as needed for pain.      Relevant Medications   baclofen (LIORESAL) 10 MG tablet   FLUoxetine (PROZAC) 20 MG tablet   Other Relevant Orders   DG Hip Unilat W OR W/O Pelvis 2-3 Views Right   Chronic midline thoracic back pain    Chronic, stable.  She has a history of T7-8 bulging disc.  She states that this is an operable.  She is currently using lidocaine patches daily.  She also will take baclofen 10 mg and tramadol 50 mg as needed.  She states this is very rare and may be once or twice a year.  Will have her continue this regimen.  Follow-up with any concerns.      Relevant Medications   baclofen (LIORESAL) 10 MG tablet   FLUoxetine (PROZAC) 20 MG tablet   Forgetfulness    She has been experiencing some forgetfulness recently and repeating conversations with family and friends.  She does have a history of Alzheimer's in her  family, however she is not wanting testing right now.      Seizures (HCC)    Chronic, stable.  Continue Lamictal 50 mg twice a day.  She is following with neurology.  Continue collaboration's recommendations from specialist      Primary insomnia    Chronic, stable.  Continue Ambien 10 mg at bedtime as needed.        Follow up plan: Return in about 4 weeks (around 06/22/2023) for CPE.

## 2023-05-25 NOTE — Patient Instructions (Signed)
It was great to see you!  Restart your prozac daily.   I am requesting your labs from Dr. Estella Husk.   I have ordered an x-ray of your hip. You can walk in to [facility] office Monday- Friday 8-5 (they may close from 12-1 for lunch). You don't need an appointment.   Let's follow-up in 4 weeks, sooner if you have concerns.  If a referral was placed today, you will be contacted for an appointment. Please note that routine referrals can sometimes take up to 3-4 weeks to process. Please call our office if you haven't heard anything after this time frame.  Take care,  Rodman Pickle, NP

## 2023-05-26 DIAGNOSIS — F5101 Primary insomnia: Secondary | ICD-10-CM | POA: Insufficient documentation

## 2023-05-26 NOTE — Assessment & Plan Note (Signed)
She has a history of multiple PCI's.  She is following with cardiology.  Continue Plavix 75 mg daily, aspirin 81 mg daily, atorvastatin 20 mg daily, Setia 10 mg daily.

## 2023-05-26 NOTE — Assessment & Plan Note (Signed)
Chronic, stable.  Continue atorvastatin 20 mg daily and Zetia 10 mg daily.

## 2023-05-26 NOTE — Assessment & Plan Note (Signed)
She has been experiencing some forgetfulness recently and repeating conversations with family and friends.  She does have a history of Alzheimer's in her family, however she is not wanting testing right now.

## 2023-05-26 NOTE — Assessment & Plan Note (Signed)
Chronic, stable.  She is currently taking Trelegy inhaler daily.  Continue this regimen as her symptoms are well controlled.

## 2023-05-26 NOTE — Assessment & Plan Note (Signed)
Chronic, stable.  Continue Ambien 10 mg at bedtime as needed.

## 2023-05-26 NOTE — Assessment & Plan Note (Signed)
Chronic, not controlled.  She has been taking fluoxetine 20 mg off and on throughout the years for depression.  She has been having some ongoing fatigue, lack of interest in things over the last 6 months.  May be related to MS versus depression.  Will have her restart fluoxetine 20 mg daily.  Follow-up in 4 weeks.

## 2023-05-26 NOTE — Assessment & Plan Note (Signed)
Chronic, stable. Continue amlodipine 2.5mg daily.  

## 2023-05-26 NOTE — Assessment & Plan Note (Signed)
Chronic, stable.  Continue Lamictal 50 mg twice a day.  She is following with neurology.  Continue collaboration's recommendations from specialist

## 2023-05-26 NOTE — Assessment & Plan Note (Signed)
Chronic, ongoing.  She has been having some ongoing fatigue and falls since January.  Unsure if this is related to her MS.  She is currently following with neurology regularly.  She is taking Vumerity twice a day.  She had labs done recently, requesting recent lab work and progress notes from neurology.  Follow-up in 4 weeks.

## 2023-05-26 NOTE — Assessment & Plan Note (Signed)
She has been experiencing right hip pain for the last several years, however is flared up over the last couple of weeks.  She has had multiple falls recently.  Will check an x-ray of her right hip.  She can continue to take Tylenol as needed for pain.

## 2023-05-26 NOTE — Assessment & Plan Note (Signed)
Chronic, stable.  She is currently following with cardiology.  Continue Ranexa 500 mg twice a day.

## 2023-05-26 NOTE — Assessment & Plan Note (Signed)
Noted to right lower abdomen.  Reassured patient.

## 2023-05-26 NOTE — Assessment & Plan Note (Signed)
Chronic, stable.  She has a history of T7-8 bulging disc.  She states that this is an operable.  She is currently using lidocaine patches daily.  She also will take baclofen 10 mg and tramadol 50 mg as needed.  She states this is very rare and may be once or twice a year.  Will have her continue this regimen.  Follow-up with any concerns.

## 2023-06-23 ENCOUNTER — Encounter: Payer: Medicare Other | Admitting: Nurse Practitioner

## 2023-06-24 ENCOUNTER — Ambulatory Visit (INDEPENDENT_AMBULATORY_CARE_PROVIDER_SITE_OTHER): Payer: Medicare Other | Admitting: Nurse Practitioner

## 2023-06-24 ENCOUNTER — Other Ambulatory Visit (HOSPITAL_COMMUNITY)
Admission: RE | Admit: 2023-06-24 | Discharge: 2023-06-24 | Disposition: A | Discharge status: Home / Self Care | Payer: Medicare Other | Source: Ambulatory Visit | Attending: Nurse Practitioner | Admitting: Nurse Practitioner

## 2023-06-24 ENCOUNTER — Encounter: Payer: Self-pay | Admitting: Nurse Practitioner

## 2023-06-24 VITALS — BP 122/70 | HR 78 | Temp 98.3°F | Ht 62.0 in | Wt 96.6 lb

## 2023-06-24 DIAGNOSIS — Z114 Encounter for screening for human immunodeficiency virus [HIV]: Secondary | ICD-10-CM | POA: Diagnosis not present

## 2023-06-24 DIAGNOSIS — Z0000 Encounter for general adult medical examination without abnormal findings: Secondary | ICD-10-CM | POA: Diagnosis not present

## 2023-06-24 DIAGNOSIS — R569 Unspecified convulsions: Secondary | ICD-10-CM

## 2023-06-24 DIAGNOSIS — Z124 Encounter for screening for malignant neoplasm of cervix: Secondary | ICD-10-CM

## 2023-06-24 DIAGNOSIS — F339 Major depressive disorder, recurrent, unspecified: Secondary | ICD-10-CM | POA: Diagnosis not present

## 2023-06-24 DIAGNOSIS — M810 Age-related osteoporosis without current pathological fracture: Secondary | ICD-10-CM

## 2023-06-24 DIAGNOSIS — Z1151 Encounter for screening for human papillomavirus (HPV): Secondary | ICD-10-CM | POA: Insufficient documentation

## 2023-06-24 DIAGNOSIS — G35 Multiple sclerosis: Secondary | ICD-10-CM | POA: Diagnosis not present

## 2023-06-24 DIAGNOSIS — Z01419 Encounter for gynecological examination (general) (routine) without abnormal findings: Secondary | ICD-10-CM | POA: Diagnosis not present

## 2023-06-24 DIAGNOSIS — R5382 Chronic fatigue, unspecified: Secondary | ICD-10-CM | POA: Diagnosis not present

## 2023-06-24 DIAGNOSIS — Z1159 Encounter for screening for other viral diseases: Secondary | ICD-10-CM

## 2023-06-24 DIAGNOSIS — E538 Deficiency of other specified B group vitamins: Secondary | ICD-10-CM

## 2023-06-24 DIAGNOSIS — F5101 Primary insomnia: Secondary | ICD-10-CM

## 2023-06-24 DIAGNOSIS — J432 Centrilobular emphysema: Secondary | ICD-10-CM

## 2023-06-24 DIAGNOSIS — E785 Hyperlipidemia, unspecified: Secondary | ICD-10-CM | POA: Diagnosis not present

## 2023-06-24 DIAGNOSIS — Z1231 Encounter for screening mammogram for malignant neoplasm of breast: Secondary | ICD-10-CM

## 2023-06-24 LAB — VITAMIN B12: Vitamin B-12: 426 pg/mL (ref 211–911)

## 2023-06-24 LAB — CBC WITH DIFFERENTIAL/PLATELET
Basophils Absolute: 0.1 10*3/uL (ref 0.0–0.1)
Basophils Relative: 0.7 % (ref 0.0–3.0)
Eosinophils Absolute: 0 10*3/uL (ref 0.0–0.7)
Eosinophils Relative: 0.6 % (ref 0.0–5.0)
HCT: 39 % (ref 36.0–46.0)
Hemoglobin: 13.4 g/dL (ref 12.0–15.0)
Lymphocytes Relative: 14.9 % (ref 12.0–46.0)
Lymphs Abs: 1.2 10*3/uL (ref 0.7–4.0)
MCHC: 34.4 g/dL (ref 30.0–36.0)
MCV: 89.9 fl (ref 78.0–100.0)
Monocytes Absolute: 0.5 10*3/uL (ref 0.1–1.0)
Monocytes Relative: 5.6 % (ref 3.0–12.0)
Neutro Abs: 6.4 10*3/uL (ref 1.4–7.7)
Neutrophils Relative %: 78.2 % — ABNORMAL HIGH (ref 43.0–77.0)
Platelets: 461 10*3/uL — ABNORMAL HIGH (ref 150.0–400.0)
RBC: 4.34 Mil/uL (ref 3.87–5.11)
RDW: 13.2 % (ref 11.5–15.5)
WBC: 8.3 10*3/uL (ref 4.0–10.5)

## 2023-06-24 LAB — LIPID PANEL
Cholesterol: 290 mg/dL — ABNORMAL HIGH (ref 0–200)
HDL: 80.4 mg/dL (ref >39.00)
LDL Cholesterol: 190 mg/dL — ABNORMAL HIGH (ref 0–99)
NonHDL: 209.77
Total CHOL/HDL Ratio: 4
Triglycerides: 98 mg/dL (ref 0.0–149.0)
VLDL: 19.6 mg/dL (ref 0.0–40.0)

## 2023-06-24 LAB — COMPREHENSIVE METABOLIC PANEL
ALT: 11 U/L (ref 0–35)
AST: 18 U/L (ref 0–37)
Albumin: 4.6 g/dL (ref 3.5–5.2)
Alkaline Phosphatase: 82 U/L (ref 39–117)
BUN: 7 mg/dL (ref 6–23)
CO2: 26 mEq/L (ref 19–32)
Calcium: 9.4 mg/dL (ref 8.4–10.5)
Chloride: 92 mEq/L — ABNORMAL LOW (ref 96–112)
Creatinine, Ser: 0.58 mg/dL (ref 0.40–1.20)
GFR: 95.22 mL/min (ref >60.00)
Glucose, Bld: 90 mg/dL (ref 70–99)
Potassium: 4.1 mEq/L (ref 3.5–5.1)
Sodium: 127 mEq/L — ABNORMAL LOW (ref 135–145)
Total Bilirubin: 0.5 mg/dL (ref 0.2–1.2)
Total Protein: 7.1 g/dL (ref 6.0–8.3)

## 2023-06-24 LAB — TSH: TSH: 1.65 u[IU]/mL (ref 0.35–5.50)

## 2023-06-24 LAB — VITAMIN D 25 HYDROXY (VIT D DEFICIENCY, FRACTURES): VITD: 14.86 ng/mL — ABNORMAL LOW (ref 30.00–100.00)

## 2023-06-24 NOTE — Progress Notes (Signed)
BP 122/70 (BP Location: Left Arm)   Pulse 78   Temp 98.3 F (36.8 C)   Ht 5\' 2"  (1.575 m)   Wt 96 lb 9.6 oz (43.8 kg)   SpO2 100%   BMI 17.67 kg/m    Subjective:    Patient ID: CING Rankin, female    DOB: 07-06-1956, 67 y.o.   MRN: [id number]  CC: Chief Complaint  Patient presents with   Annual Exam    With PAP, schedule a mammogram    HPI: Crystal Clark is a 67 y.o. female presenting on 06/24/2023 for comprehensive medical examination. Current medical complaints include: fatigue  She is still having severe fatigue that started in January. She re-started her fluoxetine which is helping with her mood, but is still having the fatigue. She states that this is not like her to be sitting so often and not doing more things around the house or going out. She has an appointment with her neurologist next week to see if this is progression of her MS. [place] has pulled multiple ticks off her over the past few months.   She currently lives with: husband Menopausal Symptoms: no  Depression and Anxiety Screen done today and results listed below:     05/25/2023    8:14 AM  Depression screen PHQ 2/9  Decreased Interest 1  Down, Depressed, Hopeless 1  PHQ - 2 Score 2  Altered sleeping 2  Tired, decreased energy 3  Change in appetite 3  Feeling bad or failure about yourself  0  Trouble concentrating 0  Moving slowly or fidgety/restless 2  Suicidal thoughts 0  PHQ-9 Score 12  Difficult doing work/chores Somewhat difficult      05/25/2023    8:15 AM  GAD 7 : Generalized Anxiety Score  Nervous, Anxious, on Edge 0  Control/stop worrying 0  Worry too much - different things 2  Trouble relaxing 0  Restless 0  Easily annoyed or irritable 2  Afraid - awful might happen 0  Total GAD 7 Score 4  Anxiety Difficulty Somewhat difficult    The patient has a history of falls. I did complete a risk assessment for falls. A plan of care for  falls was documented.   Past Medical History:  Past Medical History:  Diagnosis Date   Anemia    Chronic mid back pain    "T7-8 herniated disc that cannot be repaired"   Coronary artery disease    a) s/p stent to LAD in 2006 in [place], [place]. b) unchanged cath 2013. c) tandem 70% lesions distal LAD with ISR s/p balloon angio and DES 07/2014. d) stable cath 08/2016 without obstructive lesion.   Dyslipidemia    GERD (gastroesophageal reflux disease)    History of blood transfusion 1959   MS (multiple sclerosis) (HCC)    a) since 1997. b) cannot take Imdur due to medication interactions.   Raynaud's disease dx'd 2005   bb contraindicated   Tobacco use    30+ pack years    Surgical History:  Past Surgical History:  Procedure Laterality Date   CARDIAC CATHETERIZATION  12/2011   CARDIAC CATHETERIZATION N/A 09/03/2016   Procedure: Left Heart Cath and Coronary Angiography;  Surgeon: Marykay Lex, MD;  [facility]: [facility] INVASIVE CV LAB;  Service: Cardiovascular;  Laterality: N/A;   CESAREAN SECTION  X 2   CORONARY ANGIOPLASTY WITH STENT PLACEMENT  03/2005   CORONARY ANGIOPLASTY  WITH STENT PLACEMENT  07/18/2014   ISRS- Mid LAD: Promus Premier 2.75 x 16 mm DES    KNEE ARTHROSCOPY Left    LEFT HEART CATHETERIZATION WITH CORONARY ANGIOGRAM N/A 12/23/2011   Procedure: LEFT HEART CATHETERIZATION WITH CORONARY ANGIOGRAM;  Surgeon: Iran Ouch, MD;  Location: [facility] CATH LAB;  Service: Cardiovascular;  Laterality: N/A;   LEFT HEART CATHETERIZATION WITH CORONARY ANGIOGRAM N/A 07/18/2014   Procedure: LEFT HEART CATHETERIZATION WITH CORONARY ANGIOGRAM;  Surgeon: Lesleigh Noe, MD;  Location: [facility] CATH LAB;  Service: Cardiovascular;  Laterality: N/A;    Medications:  Current Outpatient Medications on File Prior to Visit  Medication Sig   albuterol (VENTOLIN HFA) 108 (90 Base) MCG/ACT inhaler Inhale 1 puff into the lungs every 4 (four) hours as needed.   amLODipine (NORVASC) 2.5 MG tablet Take 2.5 mg by  mouth daily.   aspirin EC 81 MG tablet Take 81 mg by mouth daily.     atorvastatin (LIPITOR) 20 MG tablet Take 1 tablet (20 mg total) by mouth every evening.   baclofen (LIORESAL) 10 MG tablet Take 10 mg by mouth as needed.   clopidogrel (PLAVIX) 75 MG tablet Take 1 tablet (75 mg total) by mouth daily.   cyanocobalamin (VITAMIN B12) 1000 MCG/ML injection Inject 1,000 mcg into the muscle once a week.   ezetimibe (ZETIA) 10 MG tablet TAKE 1 TABLET BY MOUTH  DAILY   FLUoxetine (PROZAC) 20 MG tablet Take 1 tablet (20 mg total) by mouth daily.   Fluticasone-Umeclidin-Vilant 100-62.5-25 MCG/ACT AEPB Inhale into the lungs.   lamoTRIgine (LAMICTAL) 25 MG tablet Take 50 mg by mouth 2 (two) times daily.   lidocaine (LIDODERM) 5 % Place 2 patches onto the skin daily as needed. For pain Remove & Discard patch within 12 hours or as directed by MD    nitroGLYCERIN (NITROSTAT) 0.4 MG SL tablet DISSOLVE 1 TABLET UNDER THE TONGUE AS DIRECTED (NOT TO  EXCEED 3 TABLETS IN 15  MINUTES, CALL 911 IF CHEST  PAIN PERSISTS)   ondansetron (ZOFRAN-ODT) 4 MG disintegrating tablet Take 1 tablet (4 mg total) by mouth every 8 (eight) hours as needed for nausea or vomiting.   pantoprazole (PROTONIX) 40 MG tablet Take 1 tablet (40 mg total) by mouth 2 (two) times daily.   ranolazine (RANEXA) 500 MG 12 hr tablet Take 1 tablet (500 mg total) by mouth 2 (two) times daily.   traMADol (ULTRAM) 50 MG tablet Take 50 mg by mouth every 6 (six) hours as needed for moderate pain.    Vitamin D, Ergocalciferol, (DRISDOL) 1.25 MG (50000 UNIT) CAPS capsule Take 50,000 Units by mouth every 7 (seven) days.   VUMERITY 231 MG CPDR Take 2 capsules by mouth 2 (two) times daily.   zolpidem (AMBIEN) 10 MG tablet Take 10 mg by mouth at bedtime.   No current facility-administered medications on file prior to visit.    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  Tobacco Use   Smoking status: Former    Current packs/day: 0.00    Average packs/day: 0.5 packs/day for 32.0 years (16.0 ttl pk-yrs)    Types: Cigars, Cigarettes    Start date: 01/01/1985    Quit date: 01/01/2017    Years since quitting: 6.4   Smokeless tobacco: Never  Vaping Use   Vaping status: Never Used  Substance and Sexual Activity   Alcohol use: No   Drug use: No   Sexual activity: Yes    Birth control/protection: Post-menopausal  Other Topics Concern   Not on file  Social History Narrative   Not on file   Social Determinants of Health   Financial Resource Strain: Not on file  Food Insecurity: Not on file  Transportation Needs: Not on file  Physical Activity: Not on file  Stress: Not on file  Social Connections: Unknown (04/26/2022)   Received from [facility], [facility]   Social Network    Social Network: Not on file  Intimate Partner Violence: Unknown (03/18/2022)   Received from [facility], [facility]   HITS    Physically Hurt: Not on file    Insult or Talk Down To: Not on file    Threaten Physical Harm: Not on file    Scream or Curse: Not on file   Social History   Tobacco Use  Smoking Status Former   Current packs/day: 0.00   Average packs/day: 0.5 packs/day for 32.0 years (16.0 ttl pk-yrs)   Types: Cigars, Cigarettes   Start date: 01/01/1985   Quit date: 01/01/2017   Years since quitting: 6.4  Smokeless Tobacco Never   Social History   Substance and Sexual Activity  Alcohol Use No    Family History:  Family History  Problem Relation Age of Onset    Colon polyps Mother    Alzheimer's disease Mother    Hypertension Mother    Coronary artery disease Father        s/p CABG at 63   Colon cancer Neg Hx    Stomach cancer Neg Hx     Past medical history, surgical history, medications, allergies, family history and social history reviewed with patient today and changes made to appropriate areas of the chart.   Review of Systems  Constitutional:  Positive for malaise/fatigue. Negative for fever.  HENT: Negative.    Eyes: Negative.   Respiratory: Negative.    Cardiovascular: Negative.   Gastrointestinal: Negative.   Genitourinary: Negative.   Musculoskeletal: Negative.   Skin: Negative.   Neurological: Negative.   Psychiatric/Behavioral: Negative.     All other ROS negative except what is listed above and in the HPI.      Objective:    BP 122/70 (BP Location: Left Arm)   Pulse 78   Temp 98.3 F (36.8 C)   Ht 5\' 2"  (1.575 m)   Wt 96 lb 9.6 oz (43.8 kg)   SpO2 100%   BMI 17.67 kg/m   Wt Readings from Last 3 Encounters:  06/24/23 96 lb 9.6 oz (43.8 kg)  05/25/23 100 lb 3.2 oz (45.5 kg)  03/02/18 121 lb (54.9 kg)    Physical Exam Vitals and nursing note reviewed. Exam conducted with a chaperone present.  Constitutional:      General: She is not in acute distress.    Appearance: Normal appearance.  HENT:     Head: Normocephalic and atraumatic.     Right Ear: Tympanic membrane, ear canal and  external ear normal.     Left Ear: Tympanic membrane, ear canal and external ear normal.  Eyes:     Conjunctiva/sclera: Conjunctivae normal.  Cardiovascular:     Rate and Rhythm: Normal rate and regular rhythm.     Pulses: Normal pulses.     Heart sounds: Normal heart sounds.  Pulmonary:     Effort: Pulmonary effort is normal.     Breath sounds: Normal breath sounds.  Abdominal:     Palpations: Abdomen is soft.     Tenderness: There is no abdominal tenderness.  Genitourinary:    General: Normal vulva.     Exam position:  Lithotomy position.     Labia:        Right: No rash, tenderness or lesion.        Left: No rash, tenderness or lesion.      Cervix: Normal.     Uterus: Normal.      Adnexa: Right adnexa normal and left adnexa normal.     Comments: Atrophy noted to vaginal walls Musculoskeletal:        General: Normal range of motion.     Cervical back: Normal range of motion and neck supple.     Right lower leg: No edema.     Left lower leg: No edema.  Lymphadenopathy:     Cervical: No cervical adenopathy.  Skin:    General: Skin is warm and dry.  Neurological:     General: No focal deficit present.     Mental Status: She is alert and oriented to person, place, and time.     Cranial Nerves: No cranial nerve deficit.     Coordination: Coordination normal.     Gait: Gait normal.  Psychiatric:        Mood and Affect: Mood normal.        Behavior: Behavior normal.        Thought Content: Thought content normal.        Judgment: Judgment normal.     Results for orders placed or performed in visit on 06/24/23  CBC with Differential/Platelet  Result Value Ref Range   WBC 8.3 4.0 - 10.5 K/uL   RBC 4.34 3.87 - 5.11 Mil/uL   Hemoglobin 13.4 12.0 - 15.0 g/dL   HCT 35.0 09.3 - 81.8 %   MCV 89.9 78.0 - 100.0 fl   MCHC 34.4 30.0 - 36.0 g/dL   RDW 29.9 37.1 - 69.6 %   Platelets 461.0 (H) 150.0 - 400.0 K/uL   Neutrophils Relative % 78.2 (H) 43.0 - 77.0 %   Lymphocytes Relative 14.9 12.0 - 46.0 %   Monocytes Relative 5.6 3.0 - 12.0 %   Eosinophils Relative 0.6 0.0 - 5.0 %   Basophils Relative 0.7 0.0 - 3.0 %   Neutro Abs 6.4 1.4 - 7.7 K/uL   Lymphs Abs 1.2 0.7 - 4.0 K/uL   Monocytes Absolute 0.5 0.1 - 1.0 K/uL   Eosinophils Absolute 0.0 0.0 - 0.7 K/uL   Basophils Absolute 0.1 0.0 - 0.1 K/uL  Comprehensive metabolic panel  Result Value Ref Range   Sodium 127 (L) 135 - 145 mEq/L   Potassium 4.1 3.5 - 5.1 mEq/L   Chloride 92 (L) 96 - 112 mEq/L   CO2 26 19 - 32 mEq/L   Glucose, Bld 90 70 - 99  mg/dL   BUN 7 6 - 23 mg/dL   Creatinine, Ser 7.89 0.40 - 1.20 mg/dL   Total Bilirubin 0.5 0.2 - 1.2  mg/dL   Alkaline Phosphatase 82 39 - 117 U/L   AST 18 0 - 37 U/L   ALT 11 0 - 35 U/L   Total Protein 7.1 6.0 - 8.3 g/dL   Albumin 4.6 3.5 - 5.2 g/dL   GFR 62.95 >28.41 mL/min   Calcium 9.4 8.4 - 10.5 mg/dL  Lipid panel  Result Value Ref Range   Cholesterol 290 (H) 0 - 200 mg/dL   Triglycerides 32.4 0.0 - 149.0 mg/dL   HDL 40.10 >27.25 mg/dL   VLDL 36.6 0.0 - 44.0 mg/dL   LDL Cholesterol 347 (H) 0 - 99 mg/dL   Total CHOL/HDL Ratio 4    NonHDL 209.77   TSH  Result Value Ref Range   TSH 1.65 0.35 - 5.50 uIU/mL  Vitamin B12  Result Value Ref Range   Vitamin B-12 426 211 - 911 pg/mL  VITAMIN D 25 Hydroxy (Vit-D Deficiency, Fractures)  Result Value Ref Range   VITD 14.86 (L) 30.00 - 100.00 ng/mL  B. burgdorfi antibodies  Result Value Ref Range   B burgdorferi Ab IgG+IgM <0.90 index  HIV Antibody (routine testing w rflx)  Result Value Ref Range   HIV 1&2 Ab, 4th Generation NON-REACTIVE NON-REACTIVE  Hepatitis C antibody  Result Value Ref Range   Hepatitis C Ab NON-REACTIVE NON-REACTIVE  Cytology - PAP  Result Value Ref Range   High risk HPV Negative    Adequacy      Satisfactory for evaluation. The presence or absence of an   Adequacy      endocervical/transformation zone component cannot be determined because   Adequacy of atrophy.    Diagnosis      - Negative for intraepithelial lesion or malignancy (NILM)   Comment Normal Reference Range HPV - Negative       Assessment & Plan:   Problem List Items Addressed This Visit       Respiratory   Centrilobular emphysema (HCC)    Chronic, stable.  She is currently taking Trelegy inhaler daily.  Continue this regimen as her symptoms are well controlled.        Nervous and Auditory   MS (multiple sclerosis) (HCC)    Chronic, ongoing.  She has been having some ongoing fatigue and falls since January.  Unsure if this is  related to her MS.  She is currently following with neurology regularly.  She is taking Vumerity twice a day. She has an appointment with neurology next week.         Musculoskeletal and Integument   Age-related osteoporosis without current pathological fracture    Dexa on 08/21/13 showed a T score of -3.3. Will get updated dexa scan. Encouraged her to continue calcium and vitamin D supplement. She is interested in medication if she still has osteoporosis.       Relevant Medications   Vitamin D, Ergocalciferol, (DRISDOL) 1.25 MG (50000 UNIT) CAPS capsule   Other Relevant Orders   DG Bone Density   VITAMIN D 25 Hydroxy (Vit-D Deficiency, Fractures) (Completed)     Other   Dyslipidemia    Chronic, stable.  Continue atorvastatin 20 mg daily and Zetia 10 mg daily. Check CMP, CBC, lipid panel today.       Relevant Orders   CBC with Differential/Platelet (Completed)   Comprehensive metabolic panel (Completed)   Lipid panel (Completed)   Depression, recurrent (HCC)    Chronic, stable. Continue fluoxetine 20mg  daily.       Seizures (HCC)    Chronic,  stable.  Continue Lamictal 50 mg twice a day.  She is following with neurology.  Continue collaboration's recommendations from specialist      Primary insomnia    Chronic, stable.  Continue Ambien 10 mg at bedtime as needed.      Routine general medical examination at a health care facility - Primary    Health maintenance reviewed and updated. Discussed nutrition, exercise. Check CMP, CBC today. Follow-up 1 year.        Chronic fatigue    She has been experiencing chronic fatigue since January. May be a progression of her MS. Encouraged her to keep appointment next week with neurology. Will check CMP, CBC, TSH, vitamin B12, vitamin D and lyme and RMSF titers as she has had multiple tick bites.       Relevant Orders   TSH (Completed)   B. burgdorfi antibodies (Completed)   Rocky mtn spotted fvr abs pnl(IgG+IgM)   Vitamin B12  deficiency    Check vitamin B12 levels today and treat based on results.       Relevant Orders   Vitamin B12 (Completed)   Other Visit Diagnoses     Screening for cervical cancer       Pap with HPV done today. If this is normal, she does not need further testing.   Relevant Orders   Cytology - PAP (Completed)   Screening for HIV (human immunodeficiency virus)       Screen HIV today   Relevant Orders   HIV Antibody (routine testing w rflx) (Completed)   Encounter for hepatitis C screening test for low risk patient       Screen hepatitis C today   Relevant Orders   Hepatitis C antibody (Completed)   Encounter for screening mammogram for malignant neoplasm of breast       Mammogram ordered and scheduled for 06/28/23.   Relevant Orders   MM 3D SCREENING MAMMOGRAM BILATERAL BREAST        Follow up plan: Return in about 3 months (around 09/24/2023) for fatigue, weight.   LABORATORY TESTING:  - Pap smear: pap done  IMMUNIZATIONS:   - Tdap: Tetanus vaccination status reviewed: she wants to discuss this with her neurologist first as was told not to get any more vaccines. - Influenza: Postponed to flu season - Pneumovax: Not applicable - Prevnar:  wants to discuss this with her neurologist first as was told not to get any more vaccines - HPV: Not applicable - Zostavax vaccine: Refused  SCREENING: -Mammogram: Ordered today  - Colonoscopy: Up to date  - Bone Density: Ordered today   PATIENT COUNSELING:   Advised to take 1 mg of folate supplement per day if capable of pregnancy.   Sexuality: Discussed sexually transmitted diseases, partner selection, use of condoms, avoidance of unintended pregnancy  and contraceptive alternatives.   Advised to avoid cigarette smoking.  I discussed with the patient that most people either abstain from alcohol or drink within safe limits (<=14/week and <=4 drinks/occasion for males, <=7/weeks and <= 3 drinks/occasion for females) and that the  risk for alcohol disorders and other health effects rises proportionally with the number of drinks per week and how often a drinker exceeds daily limits.  Discussed cessation/primary prevention of drug use and availability of treatment for abuse.   Diet: Encouraged to adjust caloric intake to maintain  or achieve ideal body weight, to reduce intake of dietary saturated fat and total fat, to limit sodium intake by avoiding high sodium foods  and not adding table salt, and to maintain adequate dietary potassium and calcium preferably from fresh fruits, vegetables, and low-fat dairy products.    stressed the importance of regular exercise  Injury prevention: Discussed safety belts, safety helmets, smoke detector, smoking near bedding or upholstery.   Dental health: Discussed importance of regular tooth brushing, flossing, and dental visits.    NEXT PREVENTATIVE PHYSICAL DUE IN 1 YEAR. Return in about 3 months (around 09/24/2023) for fatigue, weight.

## 2023-06-24 NOTE — Patient Instructions (Signed)
It was great to see you!  You can try boost breeze nutrition supplement. This is a juice not milk based   You are scheduled for a mammogram at our office on 06/28/23 at 11:10am  I have placed an order for dexa scan, they should call you to schedule. If you do not hear from them in the next week, please call:  [facility] [address] [place], [address] [place], [place] [phone]  Let's follow-up in 3 months, sooner if you have concerns.  If a referral was placed today, you will be contacted for an appointment. Please note that routine referrals can sometimes take up to 3-4 weeks to process. Please call our office if you haven't heard anything after this time frame.  Take care,  Rodman Pickle, NP

## 2023-06-25 ENCOUNTER — Ambulatory Visit (INDEPENDENT_AMBULATORY_CARE_PROVIDER_SITE_OTHER): Payer: Medicare Other

## 2023-06-25 DIAGNOSIS — M25551 Pain in right hip: Secondary | ICD-10-CM | POA: Diagnosis not present

## 2023-06-25 DIAGNOSIS — G8929 Other chronic pain: Secondary | ICD-10-CM | POA: Diagnosis not present

## 2023-06-25 LAB — CYTOLOGY - PAP
Comment: NEGATIVE
Diagnosis: NEGATIVE
High risk HPV: NEGATIVE

## 2023-06-26 ENCOUNTER — Encounter: Payer: Self-pay | Admitting: Nurse Practitioner

## 2023-06-26 DIAGNOSIS — Z0000 Encounter for general adult medical examination without abnormal findings: Secondary | ICD-10-CM | POA: Insufficient documentation

## 2023-06-26 DIAGNOSIS — E538 Deficiency of other specified B group vitamins: Secondary | ICD-10-CM | POA: Insufficient documentation

## 2023-06-26 DIAGNOSIS — R5382 Chronic fatigue, unspecified: Secondary | ICD-10-CM | POA: Insufficient documentation

## 2023-06-26 DIAGNOSIS — M810 Age-related osteoporosis without current pathological fracture: Secondary | ICD-10-CM | POA: Insufficient documentation

## 2023-06-26 NOTE — Assessment & Plan Note (Signed)
Chronic, ongoing.  She has been having some ongoing fatigue and falls since January.  Unsure if this is related to her MS.  She is currently following with neurology regularly.  She is taking Vumerity twice a day. She has an appointment with neurology next week.

## 2023-06-26 NOTE — Assessment & Plan Note (Signed)
Chronic, stable.  Continue atorvastatin 20 mg daily and Zetia 10 mg daily. Check CMP, CBC, lipid panel today.

## 2023-06-26 NOTE — Assessment & Plan Note (Signed)
Health maintenance reviewed and updated. Discussed nutrition, exercise. Check CMP, CBC today. Follow-up 1 year.   

## 2023-06-26 NOTE — Assessment & Plan Note (Signed)
Check vitamin B12 levels today and treat based on results.  

## 2023-06-26 NOTE — Assessment & Plan Note (Signed)
She has been experiencing chronic fatigue since January. May be a progression of her MS. Encouraged her to keep appointment next week with neurology. Will check CMP, CBC, TSH, vitamin B12, vitamin D and lyme and RMSF titers as she has had multiple tick bites.

## 2023-06-26 NOTE — Assessment & Plan Note (Signed)
Chronic, stable.  Continue fluoxetine 20 mg daily. 

## 2023-06-26 NOTE — Assessment & Plan Note (Signed)
Chronic, stable.  She is currently taking Trelegy inhaler daily.  Continue this regimen as her symptoms are well controlled. 

## 2023-06-26 NOTE — Assessment & Plan Note (Signed)
Chronic, stable.  Continue Lamictal 50 mg twice a day.  She is following with neurology.  Continue collaboration's recommendations from specialist 

## 2023-06-26 NOTE — Assessment & Plan Note (Signed)
Chronic, stable.  Continue Ambien 10 mg at bedtime as needed. 

## 2023-06-26 NOTE — Assessment & Plan Note (Signed)
Dexa on 08/21/13 showed a T score of -3.3. Will get updated dexa scan. Encouraged her to continue calcium and vitamin D supplement. She is interested in medication if she still has osteoporosis.

## 2023-06-28 ENCOUNTER — Ambulatory Visit
Admission: RE | Admit: 2023-06-28 | Discharge: 2023-06-28 | Disposition: A | Discharge status: Home / Self Care | Payer: Managed Care, Other (non HMO) | Source: Ambulatory Visit | Attending: Nurse Practitioner | Admitting: Nurse Practitioner

## 2023-06-28 DIAGNOSIS — Z1231 Encounter for screening mammogram for malignant neoplasm of breast: Secondary | ICD-10-CM

## 2023-06-29 LAB — B. BURGDORFI ANTIBODIES: B burgdorferi Ab IgG+IgM: <0.9 index

## 2023-06-29 LAB — ROCKY MTN SPOTTED FVR ABS PNL(IGG+IGM)
RMSF IgG: NOT DETECTED
RMSF IgM: NOT DETECTED

## 2023-06-29 LAB — HIV ANTIBODY (ROUTINE TESTING W REFLEX): HIV 1&2 Ab, 4th Generation: NONREACTIVE

## 2023-06-29 LAB — HEPATITIS C ANTIBODY: Hepatitis C Ab: NONREACTIVE

## 2023-07-01 DIAGNOSIS — R202 Paresthesia of skin: Secondary | ICD-10-CM | POA: Diagnosis not present

## 2023-07-01 DIAGNOSIS — R5383 Other fatigue: Secondary | ICD-10-CM | POA: Diagnosis not present

## 2023-07-01 DIAGNOSIS — G35 Multiple sclerosis: Secondary | ICD-10-CM | POA: Diagnosis not present

## 2023-07-01 DIAGNOSIS — Z8673 Personal history of transient ischemic attack (TIA), and cerebral infarction without residual deficits: Secondary | ICD-10-CM | POA: Diagnosis not present

## 2023-07-05 ENCOUNTER — Other Ambulatory Visit: Payer: Self-pay | Admitting: Physician Assistant

## 2023-07-05 DIAGNOSIS — G35 Multiple sclerosis: Secondary | ICD-10-CM

## 2023-07-29 ENCOUNTER — Encounter (INDEPENDENT_AMBULATORY_CARE_PROVIDER_SITE_OTHER): Payer: Self-pay

## 2023-08-23 ENCOUNTER — Ambulatory Visit (INDEPENDENT_AMBULATORY_CARE_PROVIDER_SITE_OTHER): Payer: Medicare Other

## 2023-08-23 DIAGNOSIS — Z0000 Encounter for general adult medical examination without abnormal findings: Secondary | ICD-10-CM

## 2023-08-23 NOTE — Progress Notes (Signed)
Subjective:   Crystal Clark is a 67 y.o. female who presents for an Initial Medicare Annual Wellness Visit.  Visit Complete: Virtual  I connected with  Crystal Clark on 08/23/23 by a audio enabled telemedicine application and verified that I am speaking with the correct person using two identifiers.  Patient Location: Home  Provider Location: Office/Clinic  I discussed the limitations of evaluation and management by telemedicine. The patient expressed understanding and agreed to proceed.  Vital Signs: Unable to obtain new vitals due to this being a telehealth visit.  Review of Systems     Cardiac Risk Factors include: advanced age (>72men, >54 women);dyslipidemia     Objective:    Today's Vitals   08/23/23 1111  PainSc: 8    There is no height or weight on file to calculate BMI.     08/23/2023   11:25 AM 09/03/2016   12:06 AM 09/02/2016    1:18 PM 04/17/2016   11:26 AM 07/18/2014    3:19 PM 07/18/2014    7:41 AM 12/23/2011    9:55 AM  Advanced Directives  Does Patient Have a Medical Advance Directive? Yes Yes Yes No  Patient has advance directive, copy not in chart Patient does not have advance directive  Type of Advance Directive Healthcare Power of [place];Living will Healthcare Power of [place];Living will Healthcare Power of [place];Living will   Healthcare Power of [place];Living will   Does patient want to make changes to medical advance directive?      No change requested   Copy of Healthcare Power of Attorney in Chart? No - copy requested  No - copy requested      Pre-existing out of facility DNR order (yellow form or pink MOST form)     No  No    Current Medications (verified) Outpatient Encounter Medications as of 08/23/2023  Medication Sig   albuterol (VENTOLIN HFA) 108 (90 Base) MCG/ACT inhaler Inhale 1 puff into the lungs every 4 (four) hours as needed.   amLODipine (NORVASC) 2.5 MG tablet Take 2.5 mg by mouth daily.   aspirin EC 81 MG tablet Take 81 mg  by mouth daily.     atorvastatin (LIPITOR) 20 MG tablet Take 1 tablet (20 mg total) by mouth every evening.   clopidogrel (PLAVIX) 75 MG tablet Take 1 tablet (75 mg total) by mouth daily.   cyanocobalamin (VITAMIN B12) 1000 MCG/ML injection Inject 1,000 mcg into the muscle once a week.   ezetimibe (ZETIA) 10 MG tablet TAKE 1 TABLET BY MOUTH  DAILY   FLUoxetine (PROZAC) 20 MG tablet Take 1 tablet (20 mg total) by mouth daily.   Fluticasone-Umeclidin-Vilant 100-62.5-25 MCG/ACT AEPB Inhale into the lungs.   lamoTRIgine (LAMICTAL) 25 MG tablet Take 50 mg by mouth 2 (two) times daily.   lidocaine (LIDODERM) 5 % Place 2 patches onto the skin daily as needed. For pain Remove & Discard patch within 12 hours or as directed by MD    nitroGLYCERIN (NITROSTAT) 0.4 MG SL tablet DISSOLVE 1 TABLET UNDER THE TONGUE AS DIRECTED (NOT TO  EXCEED 3 TABLETS IN 15  MINUTES, CALL 911 IF CHEST  PAIN PERSISTS)   ondansetron (ZOFRAN-ODT) 4 MG disintegrating tablet Take 1 tablet (4 mg total) by mouth every 8 (eight) hours as needed for nausea or vomiting.   pantoprazole (PROTONIX) 40 MG tablet Take 1 tablet (40 mg total) by mouth 2 (two) times daily.   ranolazine (RANEXA) 500 MG 12 hr tablet Take 1 tablet (500  mg total) by mouth 2 (two) times daily.   traMADol (ULTRAM) 50 MG tablet Take 50 mg by mouth every 6 (six) hours as needed for moderate pain.    Vitamin D, Ergocalciferol, (DRISDOL) 1.25 MG (50000 UNIT) CAPS capsule Take 50,000 Units by mouth every 7 (seven) days.   VUMERITY 231 MG CPDR Take 2 capsules by mouth 2 (two) times daily.   zolpidem (AMBIEN) 10 MG tablet Take 10 mg by mouth at bedtime.   baclofen (LIORESAL) 10 MG tablet Take 10 mg by mouth as needed. (Patient not taking: Reported on 08/23/2023)   No facility-administered encounter medications on file as of 08/23/2023.    Allergies (verified) Morphine and codeine, Simvastatin, and Prednisone   History: Past Medical History:  Diagnosis Date   Anemia     Chronic mid back pain    "T7-8 herniated disc that cannot be repaired"   Coronary artery disease    a) s/p stent to LAD in 2006 in [place], [place]. b) unchanged cath 2013. c) tandem 70% lesions distal LAD with ISR s/p balloon angio and DES 07/2014. d) stable cath 08/2016 without obstructive lesion.   Dyslipidemia    GERD (gastroesophageal reflux disease)    History of blood transfusion 1959   MS (multiple sclerosis) (HCC)    a) since 1997. b) cannot take Imdur due to medication interactions.   Raynaud's disease dx'd 2005   bb contraindicated   Tobacco use    30+ pack years   Past Surgical History:  Procedure Laterality Date   CARDIAC CATHETERIZATION  12/2011   CARDIAC CATHETERIZATION N/A 09/03/2016   Procedure: Left Heart Cath and Coronary Angiography;  Surgeon: Marykay Lex, MD;  Location[facility] INVASIVE CV LAB;  Service: Cardiovascular;  Laterality: N/A;   CESAREAN SECTION  X 2   CORONARY ANGIOPLASTY WITH STENT PLACEMENT  03/2005   CORONARY ANGIOPLASTY WITH STENT PLACEMENT  07/18/2014   ISRS- Mid LAD: Promus Premier 2.75 x 16 mm DES    KNEE ARTHROSCOPY Left    LEFT HEART CATHETERIZATION WITH CORONARY ANGIOGRAM N/A 12/23/2011   Procedure: LEFT HEART CATHETERIZATION WITH CORONARY ANGIOGRAM;  Surgeon: Iran Ouch, MD;  Location: [facility] CATH LAB;  Service: Cardiovascular;  Laterality: N/A;   LEFT HEART CATHETERIZATION WITH CORONARY ANGIOGRAM N/A 07/18/2014   Procedure: LEFT HEART CATHETERIZATION WITH CORONARY ANGIOGRAM;  Surgeon: Lesleigh Noe, MD;  Location: [facility] CATH LAB;  Service: Cardiovascular;  Laterality: N/A;   Family History  Problem Relation Age of Onset   Colon polyps Mother    Alzheimer's disease Mother    Hypertension Mother    Coronary artery disease Father        s/p CABG at 36   Colon cancer Neg Hx    Stomach cancer Neg Hx    Breast cancer Neg Hx    Social History   Socioeconomic History   Marital status: Married    Spouse name: Not on file   Number of children: 2    Years of education: Not on file   Highest education level: Not on file  Occupational History   Occupation: disabled    Comment: Charity fundraiser for Pulm/cardiac rehab  Tobacco Use   Smoking status: Former    Current packs/day: 0.00    Average packs/day: 0.5 packs/day for 32.0 years (16.0 ttl pk-yrs)    Types: Cigars, Cigarettes    Start date: 01/01/1985    Quit date: 01/01/2017    Years since quitting: 6.6   Smokeless tobacco: Never  Vaping Use   Vaping status: Never Used  Substance and Sexual Activity   Alcohol use: No   Drug use: No   Sexual activity: Yes    Birth control/protection: Post-menopausal  Other Topics Concern   Not on file  Social History Narrative   Not on file   Social Determinants of Health   Financial Resource Strain: Low Risk  (08/23/2023)   Overall Financial Resource Strain (CARDIA)    Difficulty of Paying Living Expenses: Not hard at all  Food Insecurity: No Food Insecurity (08/23/2023)   Hunger Vital Sign    Worried About Running Out of Food in the Last Year: Never true    Ran Out of Food in the Last Year: Never true  Transportation Needs: No Transportation Needs (08/23/2023)   PRAPARE - Administrator, Civil Service (Medical): No    Lack of Transportation (Non-Medical): No  Physical Activity: Inactive (08/23/2023)   Exercise Vital Sign    Days of Exercise per Week: 0 days    Minutes of Exercise per Session: 0 min  Stress: No Stress Concern Present (08/23/2023)   [employer] of Occupational Health - Occupational Stress Questionnaire    Feeling of Stress : Not at all  Social Connections: Moderately Isolated (08/23/2023)   Social Connection and Isolation Panel [NHANES]    Frequency of Communication with Friends and Family: More than three times a week    Frequency of Social Gatherings with Friends and Family: More than three times a week    Attends Religious Services: Never    Database administrator or Organizations: No    Attends Hospital doctor: Never    Marital Status: Married    Tobacco Counseling Counseling given: Not Answered   Clinical Intake:  Pre-visit preparation completed: Yes  Pain : 0-10 Pain Score: 8  Pain Type: Chronic pain Pain Location: Hip Pain Orientation: Right Pain Descriptors / Indicators: Aching Pain Onset: More than a month ago Pain Frequency: Constant     Nutritional Risks: None Diabetes: No  How often do you need to have someone help you when you read instructions, pamphlets, or other written materials from your doctor or pharmacy?: 1 - Never  Interpreter Needed?: No  Information entered by :: NAllen LPN   Activities of Daily Living    08/23/2023   11:16 AM  In your present state of health, do you have any difficulty performing the following activities:  Hearing? 0  Vision? 0  Difficulty concentrating or making decisions? 1  Comment trouble with memory, followed by neurologist  Walking or climbing stairs? 0  Dressing or bathing? 0  Doing errands, shopping? 0  Preparing Food and eating ? N  Using the Toilet? N  In the past six months, have you accidently leaked urine? N  Do you have problems with loss of bowel control? N  Managing your Medications? N  Managing your Finances? N  Housekeeping or managing your Housekeeping? N    Patient Care Team: Gerre Scull, NP as PCP - General (Internal Medicine) Rollene Rotunda, MD as PCP - Cardiology (Cardiology) Ned Card, MD as Referring Physician Dani Gobble, PA-C as Physician Assistant (Neurosurgery)  Indicate any recent Medical Services you may have received from other than [facility] providers in the past year (date may be approximate).     Assessment:   This is a routine wellness examination for [place].  Hearing/Vision screen Hearing Screening - Comments:: Denies hearing issues Vision Screening - Comments::  Regular eye exams, [facility]   Goals Addressed             This Visit's Progress    Patient  Stated       08/23/2023, wants to gain weight       Depression Screen    08/23/2023   11:28 AM 05/25/2023    8:14 AM  PHQ 2/9 Scores  PHQ - 2 Score 0 2  PHQ- 9 Score 8 12    Fall Risk    08/23/2023   11:26 AM 06/26/2023    6:35 PM 05/25/2023    8:14 AM  Fall Risk   Falls in the past year? 1 1 1   Comment due to MS    Number falls in past yr: 1 1 1   Injury with Fall? 0 0 1  Risk for fall due to : History of fall(s);Medication side effect History of fall(s);Impaired balance/gait History of fall(s)  Follow up Falls evaluation completed;Falls prevention discussed Falls evaluation completed;Education provided;Falls prevention discussed Falls evaluation completed    MEDICARE RISK AT HOME: Medicare Risk at Home Any stairs in or around the home?: Yes If so, are there any without handrails?: Yes Home free of loose throw rugs in walkways, pet beds, electrical cords, etc?: Yes Adequate lighting in your home to reduce risk of falls?: Yes Life alert?: No Use of a cane, walker or w/c?: No Grab bars in the bathroom?: No Shower chair or bench in shower?: No Elevated toilet seat or a handicapped toilet?: Yes  TIMED UP AND GO:  Was the test performed? No    Cognitive Function:        08/23/2023   11:30 AM  6CIT Screen  What Year? 0 points  What month? 0 points  What time? 0 points  Count back from 20 0 points  Months in reverse 0 points  Repeat phrase 2 points  Total Score 2 points    Immunizations Immunization History  Administered Date(s) Administered   Influenza Whole 10/23/2008, 09/13/2009   PFIZER(Purple Top)SARS-COV-2 Vaccination 02/23/2020, 03/15/2020, 10/01/2020    TDAP status: Due, Education has been provided regarding the importance of this vaccine. Advised may receive this vaccine at local pharmacy or Health Dept. Aware to provide a copy of the vaccination record if obtained from local pharmacy or Health Dept. Verbalized acceptance and understanding.  Flu Vaccine  status: Declined, Education has been provided regarding the importance of this vaccine but patient still declined. Advised may receive this vaccine at local pharmacy or Health Dept. Aware to provide a copy of the vaccination record if obtained from local pharmacy or Health Dept. Verbalized acceptance and understanding.  Pneumococcal vaccine status: Due, Education has been provided regarding the importance of this vaccine. Advised may receive this vaccine at local pharmacy or Health Dept. Aware to provide a copy of the vaccination record if obtained from local pharmacy or Health Dept. Verbalized acceptance and understanding.  Covid-19 vaccine status: Information provided on how to obtain vaccines.   Qualifies for Shingles Vaccine? Yes   Zostavax completed No   Shingrix Completed?: No.    Education has been provided regarding the importance of this vaccine. Patient has been advised to call insurance company to determine out of pocket expense if they have not yet received this vaccine. Advised may also receive vaccine at local pharmacy or Health Dept. Verbalized acceptance and understanding.  Screening Tests Health Maintenance  Topic Date Due   Pneumonia Vaccine 60+ Years old (1 of 2 - PCV) Never  done   DTaP/Tdap/Td (1 - Tdap) Never done   INFLUENZA VACCINE  07/15/2023   Medicare Annual Wellness (AWV)  08/22/2024   MAMMOGRAM  06/27/2025   PAP SMEAR-Modifier  06/23/2026   Colonoscopy  04/21/2027   DEXA SCAN  Completed   Hepatitis C Screening  Completed   HIV Screening  Completed   HPV VACCINES  Aged Out   COVID-19 Vaccine  Discontinued   Zoster Vaccines- Shingrix  Discontinued    Health Maintenance  Health Maintenance Due  Topic Date Due   Pneumonia Vaccine 11+ Years old (1 of 2 - PCV) Never done   DTaP/Tdap/Td (1 - Tdap) Never done   INFLUENZA VACCINE  07/15/2023    Colorectal cancer screening: Type of screening: Colonoscopy. Completed 04/20/2017. Repeat every 10 years  Mammogram  status: Completed 06/28/2023. Repeat every year  Bone Density status: scheduled for 01/13/2024  Lung Cancer Screening: (Low Dose CT Chest recommended if Age 18-80 years, 20 pack-year currently smoking OR have quit w/in 15years.) does not qualify.   Lung Cancer Screening Referral: no  Additional Screening:  Hepatitis C Screening: does qualify; Completed 06/24/2023  Vision Screening: Recommended annual ophthalmology exams for early detection of glaucoma and other disorders of the eye. Is the patient up to date with their annual eye exam?  Yes  Who is the provider or what is the name of the office in which the patient attends annual eye exams? [facility] If pt is not established with a provider, would they like to be referred to a provider to establish care? No .   Dental Screening: Recommended annual dental exams for proper oral hygiene  Diabetic Foot Exam: n/a  Community Resource Referral / Chronic Care Management: CRR required this visit?  No   CCM required this visit?  No     Plan:     I have personally reviewed and noted the following in the patient's chart:   Medical and social history Use of alcohol, tobacco or illicit drugs  Current medications and supplements including opioid prescriptions. Patient is not currently taking opioid prescriptions. Functional ability and status Nutritional status Physical activity Advanced directives List of other physicians Hospitalizations, surgeries, and ER visits in previous 12 months Vitals Screenings to include cognitive, depression, and falls Referrals and appointments  In addition, I have reviewed and discussed with patient certain preventive protocols, quality metrics, and best practice recommendations. A written personalized care plan for preventive services as well as general preventive health recommendations were provided to patient.     Barb Merino, LPN   12/19/1094   After Visit Summary: (MyChart) Due to this being  a telephonic visit, the after visit summary with patients personalized plan was offered to patient via MyChart   Nurse Notes: none

## 2023-08-23 NOTE — Patient Instructions (Signed)
Ms. Kellas , Thank you for taking time to come for your Medicare Wellness Visit. I appreciate your ongoing commitment to your health goals. Please review the following plan we discussed and let me know if I can assist you in the future.   Referrals/Orders/Follow-Ups/Clinician Recommendations: none  This is a list of the screening recommended for you and due dates:  Health Maintenance  Topic Date Due   Pneumonia Vaccine (1 of 2 - PCV) Never done   DTaP/Tdap/Td vaccine (1 - Tdap) Never done   Flu Shot  07/15/2023   Medicare Annual Wellness Visit  08/22/2024   Mammogram  06/27/2025   Pap Smear  06/23/2026   Colon Cancer Screening  04/21/2027   DEXA scan (bone density measurement)  Completed   Hepatitis C Screening  Completed   HIV Screening  Completed   HPV Vaccine  Aged Out   COVID-19 Vaccine  Discontinued   Zoster (Shingles) Vaccine  Discontinued    Advanced directives: (Copy Requested) Please bring a copy of your health care power of attorney and living will to the office to be added to your chart at your convenience.  Next Medicare Annual Wellness Visit scheduled for next year: Yes  Insert Preventive Care attachment Insert FALL PREVENTION attachment if needed

## 2023-09-24 ENCOUNTER — Encounter: Payer: Self-pay | Admitting: Nurse Practitioner

## 2023-09-24 ENCOUNTER — Ambulatory Visit: Payer: Medicare Other | Admitting: Nurse Practitioner

## 2023-09-24 VITALS — BP 126/80 | HR 63 | Temp 97.6°F | Ht 62.0 in | Wt 95.0 lb

## 2023-09-24 DIAGNOSIS — R636 Underweight: Secondary | ICD-10-CM

## 2023-09-24 DIAGNOSIS — R5382 Chronic fatigue, unspecified: Secondary | ICD-10-CM

## 2023-09-24 MED ORDER — MIRTAZAPINE 7.5 MG PO TABS: 7.5000 mg | ORAL_TABLET | Freq: Every day | ORAL | 0 refills | Status: DC

## 2023-09-24 NOTE — Assessment & Plan Note (Signed)
Chronic, ongoing.  She is still having ongoing fatigue and trouble sleeping.  She is taking Ambien 10 mg at bedtime but will sometimes still wake up with this.  Will have her start mirtazapine 7.5 mg at bedtime for sleep and help with appetite.  Discussed holding off on the Ambien for now while she sees how the mirtazapine works for her and then she can add in Ambien back if she needs it again.  Follow-up in 4 to 6 weeks.

## 2023-09-24 NOTE — Patient Instructions (Signed)
It was great to see you!  Start mirtazapine 1 tablet at bedtime. Don't take the ambien at the same time at first, to see how this works for sleep.   Keep taking your vitamin D supplement and fluoxetine   Let's follow-up in 4-6 weeks, sooner if you have concerns.  If a referral was placed today, you will be contacted for an appointment. Please note that routine referrals can sometimes take up to 3-4 weeks to process. Please call our office if you haven't heard anything after this time frame.  Take care,  Rodman Pickle, NP

## 2023-09-24 NOTE — Assessment & Plan Note (Addendum)
Chronic, ongoing. She is still having trouble with appeitite and eating. She does not like ensures. She has lost 5 pounds since her last office visit and her BMI is 17.3.  Will have her start mirtazapine 7.5 mg at bedtime to help with sleep and appetite.  Encouraged to eat small meals throughout the day.  Follow-up in 4 to 6 weeks.

## 2023-09-24 NOTE — Progress Notes (Signed)
Established Patient Office Visit  Subjective   Patient ID: Crystal Clark, female    DOB: 1956/10/26  Age: 67 y.o. MRN: [id number]  Chief Complaint  Patient presents with   Weight Loss and Fatigue    Ongoing for 6 months    HPI  Crystal Clark is here to follow-up with fatigue and weight loss.   She states that she is trying to eat, but is still losing weight. She states that she may eat 1 small meal a day as she is not hungry. She has lost 5 pounds since her last visit. She is still having fatigue and some trouble sleeping. She is still working at her farm. She denies chest pain and shortness of breath.     ROS See pertinent positives and negatives per HPI.    Objective:     BP 126/80 (BP Location: Left Arm)   Pulse 63   Temp 97.6 F (36.4 C)   Ht 5\' 2"  (1.575 m)   Wt 95 lb (43.1 kg)   SpO2 100%   BMI 17.38 kg/m  BP Readings from Last 3 Encounters:  09/24/23 126/80  06/24/23 122/70  05/25/23 110/80   Wt Readings from Last 3 Encounters:  09/24/23 95 lb (43.1 kg)  06/24/23 96 lb 9.6 oz (43.8 kg)  05/25/23 100 lb 3.2 oz (45.5 kg)    Physical Exam Vitals and nursing note reviewed.  Constitutional:      General: She is not in acute distress.    Appearance: Normal appearance.  HENT:     Head: Normocephalic.  Eyes:     Conjunctiva/sclera: Conjunctivae normal.  Cardiovascular:     Rate and Rhythm: Normal rate and regular rhythm.     Pulses: Normal pulses.     Heart sounds: Normal heart sounds.  Pulmonary:     Effort: Pulmonary effort is normal.     Breath sounds: Normal breath sounds.  Musculoskeletal:     Cervical back: Normal range of motion.  Skin:    General: Skin is warm.  Neurological:     General: No focal deficit present.     Mental Status: She is alert and oriented to person, place, and time.  Psychiatric:        Mood and Affect: Mood normal.        Behavior: Behavior normal.        Thought Content: Thought content normal.         Judgment: Judgment normal.    The 10-year ASCVD risk score (Arnett DK, et al., 2019) is: 13.5%    Assessment & Plan:   Problem List Items Addressed This Visit       Other   Chronic fatigue    Chronic, ongoing.  She is still having ongoing fatigue and trouble sleeping.  She is taking Ambien 10 mg at bedtime but will sometimes still wake up with this.  Will have her start mirtazapine 7.5 mg at bedtime for sleep and help with appetite.  Discussed holding off on the Ambien for now while she sees how the mirtazapine works for her and then she can add in Ambien back if she needs it again.  Follow-up in 4 to 6 weeks.      Underweight - Primary    Chronic, ongoing. She is still having trouble with appeitite and eating. She does not like ensures. She has lost 5 pounds since her last office visit and her BMI is 17.3.  Will have her start mirtazapine 7.5  mg at bedtime to help with sleep and appetite.  Encouraged to eat small meals throughout the day.  Follow-up in 4 to 6 weeks.       Return in about 4 weeks (around 10/22/2023) for 4-6 weeks weight, fatigue.    Gerre Scull, NP

## 2023-10-23 ENCOUNTER — Other Ambulatory Visit: Payer: Self-pay | Admitting: Nurse Practitioner

## 2023-11-05 ENCOUNTER — Ambulatory Visit (INDEPENDENT_AMBULATORY_CARE_PROVIDER_SITE_OTHER): Payer: Medicare Other | Admitting: Nurse Practitioner

## 2023-11-05 ENCOUNTER — Encounter: Payer: Self-pay | Admitting: Nurse Practitioner

## 2023-11-05 VITALS — BP 128/70 | HR 69 | Temp 97.7°F | Ht 62.0 in | Wt 100.2 lb

## 2023-11-05 DIAGNOSIS — E785 Hyperlipidemia, unspecified: Secondary | ICD-10-CM | POA: Diagnosis not present

## 2023-11-05 DIAGNOSIS — R636 Underweight: Secondary | ICD-10-CM | POA: Diagnosis not present

## 2023-11-05 DIAGNOSIS — Z9861 Coronary angioplasty status: Secondary | ICD-10-CM

## 2023-11-05 DIAGNOSIS — R5382 Chronic fatigue, unspecified: Secondary | ICD-10-CM | POA: Diagnosis not present

## 2023-11-05 DIAGNOSIS — I2510 Atherosclerotic heart disease of native coronary artery without angina pectoris: Secondary | ICD-10-CM

## 2023-11-05 MED ORDER — RANOLAZINE ER 500 MG PO TB12: 500.0000 mg | ORAL_TABLET | Freq: Two times a day (BID) | ORAL | 3 refills | Status: DC

## 2023-11-05 MED ORDER — MIRTAZAPINE 7.5 MG PO TABS: 7.5000 mg | ORAL_TABLET | Freq: Every day | ORAL | 1 refills | Status: DC

## 2023-11-05 MED ORDER — ATORVASTATIN CALCIUM 20 MG PO TABS: 20.0000 mg | ORAL_TABLET | Freq: Every evening | ORAL | 3 refills | Status: DC

## 2023-11-05 MED ORDER — CLOPIDOGREL BISULFATE 75 MG PO TABS: 75.0000 mg | ORAL_TABLET | Freq: Every day | ORAL | 3 refills | Status: DC

## 2023-11-05 MED ORDER — EZETIMIBE 10 MG PO TABS: 10.0000 mg | ORAL_TABLET | Freq: Every day | ORAL | 3 refills | Status: DC

## 2023-11-05 MED ORDER — AMLODIPINE BESYLATE 2.5 MG PO TABS: 2.5000 mg | ORAL_TABLET | Freq: Every day | ORAL | 3 refills | Status: DC

## 2023-11-05 NOTE — Progress Notes (Signed)
Established Patient Office Visit  Subjective   Patient ID: Crystal Clark, female    DOB: 06-Sep-1956  Age: 67 y.o. MRN: [id number]  Chief Complaint  Patient presents with   Underweight    Follow up, no concerns    HPI  Discussed the use of AI scribe software for clinical note transcription with the patient, who gave verbal consent to proceed.  History of Present Illness   Crystal Clark, a patient with a history of MS, presents with a recent weight gain of five pounds. She reports improved sleep and appetite, which she attributes to her weight gain. She also notes a decrease in fatigue, although she still recognizes her age as a factor. She mentions that she is now able to eat without forcing herself, which she describes as a Personal assistant. She also reports that she is now able to satisfy her cravings by preparing meals at home, which she prefers over eating out. She is not having any side effects to the mirtazapine.        ROS See pertinent positives and negatives per HPI.    Objective:     BP 128/70 (BP Location: Left Arm)   Pulse 69   Temp 97.7 F (36.5 C)   Ht 5\' 2"  (1.575 m)   Wt 100 lb 3.2 oz (45.5 kg)   SpO2 100%   BMI 18.33 kg/m  BP Readings from Last 3 Encounters:  11/05/23 128/70  09/24/23 126/80  06/24/23 122/70   Wt Readings from Last 3 Encounters:  11/05/23 100 lb 3.2 oz (45.5 kg)  09/24/23 95 lb (43.1 kg)  06/24/23 96 lb 9.6 oz (43.8 kg)      Physical Exam Vitals and nursing note reviewed.  Constitutional:      General: She is not in acute distress.    Appearance: Normal appearance.  HENT:     Head: Normocephalic.  Eyes:     Conjunctiva/sclera: Conjunctivae normal.  Cardiovascular:     Rate and Rhythm: Normal rate and regular rhythm.     Pulses: Normal pulses.     Heart sounds: Normal heart sounds.  Pulmonary:     Effort: Pulmonary effort is normal.     Breath sounds: Normal breath sounds.  Musculoskeletal:     Cervical back: Normal range of motion.   Skin:    General: Skin is warm.  Neurological:     General: No focal deficit present.     Mental Status: She is alert and oriented to person, place, and time.  Psychiatric:        Mood and Affect: Mood normal.        Behavior: Behavior normal.        Thought Content: Thought content normal.        Judgment: Judgment normal.    The 10-year ASCVD risk score (Arnett DK, et al., 2019) is: 13.9%    Assessment & Plan:   Problem List Items Addressed This Visit       Cardiovascular and Mediastinum   CAD S/P percutaneous coronary angioplasty (Chronic)    She has a history of multiple PCI's.  She is following with cardiology.  Continue Plavix 75 mg daily, aspirin 81 mg daily, atorvastatin 20 mg daily, Zetia 10 mg daily, and amlodipine 2.5mg  daily..      Relevant Medications   amLODipine (NORVASC) 2.5 MG tablet   atorvastatin (LIPITOR) 20 MG tablet   ezetimibe (ZETIA) 10 MG tablet   ranolazine (RANEXA) 500 MG 12 hr tablet  Other   Dyslipidemia    Chronic, stable.  Continue atorvastatin 20 mg daily and Zetia 10 mg daily.      Relevant Medications   atorvastatin (LIPITOR) 20 MG tablet   ezetimibe (ZETIA) 10 MG tablet   Chronic fatigue - Primary    She has shown significant improvement with a 5-pound weight gain, reporting an improved appetite, better sleep, and less fatigue without any side effects from the medication. We will continue the mirtazapine 7.5mg  at bedtime, refill a 90-day supply of the medication, and encourage continued healthy eating habits. Follow-up in 3 months.       Underweight    She has shown significant improvement with a 5-pound weight gain, reporting an improved appetite, better sleep, and less fatigue without any side effects from the medication. We will continue the mirtazapine 7.5mg  at bedtime, refill a 90-day supply of the medication, and encourage continued healthy eating habits. Follow-up in 3 months.        Return in about 3 months (around  02/05/2024) for weight check.    Gerre Scull, NP

## 2023-11-05 NOTE — Assessment & Plan Note (Signed)
Chronic, stable.  Continue atorvastatin 20 mg daily and Zetia 10 mg daily.

## 2023-11-05 NOTE — Assessment & Plan Note (Signed)
She has shown significant improvement with a 5-pound weight gain, reporting an improved appetite, better sleep, and less fatigue without any side effects from the medication. We will continue the mirtazapine 7.5mg  at bedtime, refill a 90-day supply of the medication, and encourage continued healthy eating habits. Follow-up in 3 months.

## 2023-11-05 NOTE — Assessment & Plan Note (Signed)
Crystal Clark has a history of multiple PCI's.  Crystal Clark is following with cardiology.  Continue Plavix 75 mg daily, aspirin 81 mg daily, atorvastatin 20 mg daily, Zetia 10 mg daily, and amlodipine 2.5mg  daily.Marland Kitchen

## 2023-11-05 NOTE — Patient Instructions (Signed)
It was great to see you!  I have refilled your medications  Let's follow-up in 3 months, sooner if you have concerns.  If a referral was placed today, you will be contacted for an appointment. Please note that routine referrals can sometimes take up to 3-4 weeks to process. Please call our office if you haven't heard anything after this time frame.  Take care,  Rodman Pickle, NP

## 2023-11-21 ENCOUNTER — Other Ambulatory Visit: Payer: Self-pay | Admitting: Nurse Practitioner

## 2023-11-22 ENCOUNTER — Other Ambulatory Visit: Payer: Self-pay | Admitting: Nurse Practitioner

## 2023-12-09 DIAGNOSIS — Z79899 Other long term (current) drug therapy: Secondary | ICD-10-CM | POA: Diagnosis not present

## 2023-12-09 DIAGNOSIS — G35 Multiple sclerosis: Secondary | ICD-10-CM | POA: Diagnosis not present

## 2023-12-13 DIAGNOSIS — G35 Multiple sclerosis: Secondary | ICD-10-CM | POA: Diagnosis not present

## 2023-12-13 DIAGNOSIS — Z79899 Other long term (current) drug therapy: Secondary | ICD-10-CM | POA: Diagnosis not present

## 2023-12-14 DIAGNOSIS — G35 Multiple sclerosis: Secondary | ICD-10-CM | POA: Diagnosis not present

## 2023-12-21 ENCOUNTER — Encounter: Payer: Self-pay | Admitting: Nurse Practitioner

## 2023-12-21 ENCOUNTER — Ambulatory Visit (INDEPENDENT_AMBULATORY_CARE_PROVIDER_SITE_OTHER): Payer: Medicare Other | Admitting: Nurse Practitioner

## 2023-12-21 VITALS — BP 142/90 | HR 79 | Temp 97.8°F | Ht 62.0 in | Wt 105.0 lb

## 2023-12-21 DIAGNOSIS — I10 Essential (primary) hypertension: Secondary | ICD-10-CM | POA: Diagnosis not present

## 2023-12-21 DIAGNOSIS — R200 Anesthesia of skin: Secondary | ICD-10-CM | POA: Diagnosis not present

## 2023-12-21 MED ORDER — AMLODIPINE BESYLATE 5 MG PO TABS: 5.0000 mg | ORAL_TABLET | Freq: Every day | ORAL | 1 refills | Status: DC

## 2023-12-21 NOTE — Assessment & Plan Note (Signed)
 New onset hypertension. BP at neurologist's office was 167/91. She is taking amlodipine  for Raynaud's and not due to BP originally. Her blood pressure, especially the diastolic component, has increased despite taking Norvasc , with no identifiable new stressors or dietary changes and no symptoms such as chest pain or shortness of breath. We will increase Norvasc  from 2.5mg  to 5mg  daily. She should continue monitoring her blood pressure at home and report if there is no improvement in two weeks. Recent CMP and CBC reviewed.

## 2023-12-21 NOTE — Patient Instructions (Signed)
 It was great to see you!  Increase your amlodipine  to 5mg  daily. You can take 2 of your current pills, then when you get the new bottle from your pharmacy, take one.   Let's follow-up at your next appointment, sooner if you have concerns.  If a referral was placed today, you will be contacted for an appointment. Please note that routine referrals can sometimes take up to 3-4 weeks to process. Please call our office if you haven't heard anything after this time frame.  Take care,  Tinnie Harada, NP

## 2023-12-21 NOTE — Assessment & Plan Note (Signed)
 She has experienced recent left-sided numbness and cognitive/mobility issues following a COVID booster, initially thought to be mini-strokes. However, an MRI suggested a possible meningioma, with no recent falls or headaches reported. MRI was negative for stroke. She will await the scheduling and results of a cervical MRI and continue taking daily baby aspirin , and following with neurology.

## 2023-12-21 NOTE — Progress Notes (Signed)
 Established Patient Office Visit  Subjective   Patient ID: ANAIKA SANTILLANO, female    DOB: 07-30-1956  Age: 68 y.o. MRN: [id number]  Chief Complaint  Patient presents with   Sudden Onset of Hypertension    Found during 12/26 OV with neurologist, discuss recent MRI    HPI  Discussed the use of AI scribe software for clinical note transcription with the patient, who gave verbal consent to proceed.  History of Present Illness   She reports that her blood pressure has been consistently elevated since a recent visit to her neurologist 2 weeks ago. The patient is currently on Norvasc  2.5mg  daily, but this medication has not been effective in controlling her blood pressure. The patient denies any changes in lifestyle or diet that could contribute to the elevated blood pressure. She has been checking her blood pressure at home and it has averaged 130s-140s/90s-100s. She denies chest pain, shortness of breath, and headaches.   In addition to the hypertension, the patient has been experiencing left-sided numbness. This symptom prompted a visit to her neurologist and subsequent MRI, which revealed a potential meningioma. The patient is scheduled for a cervical MRI for further evaluation. The patient denies any chest pain, shortness of breath, or headaches. She reports stiffness in the neck area in the mornings.        ROS See pertinent positives and negatives per HPI.    Objective:     BP (!) 142/90 (BP Location: Left Arm, Cuff Size: Small)   Pulse 79   Temp 97.8 F (36.6 C)   Ht 5' 2 (1.575 m)   Wt 105 lb (47.6 kg)   SpO2 100%   BMI 19.20 kg/m  BP Readings from Last 3 Encounters:  12/21/23 (!) 142/90  11/05/23 128/70  09/24/23 126/80   Wt Readings from Last 3 Encounters:  12/21/23 105 lb (47.6 kg)  11/05/23 100 lb 3.2 oz (45.5 kg)  09/24/23 95 lb (43.1 kg)      Physical Exam Vitals and nursing note reviewed.  Constitutional:      General: She is not in acute  distress.    Appearance: Normal appearance.  HENT:     Head: Normocephalic.  Eyes:     Conjunctiva/sclera: Conjunctivae normal.  Cardiovascular:     Rate and Rhythm: Normal rate and regular rhythm.     Pulses: Normal pulses.     Heart sounds: Normal heart sounds.  Pulmonary:     Effort: Pulmonary effort is normal.     Breath sounds: Normal breath sounds.  Musculoskeletal:     Cervical back: Normal range of motion.  Skin:    General: Skin is warm.  Neurological:     General: No focal deficit present.     Mental Status: She is alert and oriented to person, place, and time.  Psychiatric:        Mood and Affect: Mood normal.        Behavior: Behavior normal.        Thought Content: Thought content normal.        Judgment: Judgment normal.    The 10-year ASCVD risk score (Arnett DK, et al., 2019) is: 9.4%    Assessment & Plan:   Problem List Items Addressed This Visit       Cardiovascular and Mediastinum   Primary hypertension - Primary   New onset hypertension. BP at neurologist's office was 167/91. She is taking amlodipine  for Raynaud's and not due to BP originally. Her blood  pressure, especially the diastolic component, has increased despite taking Norvasc , with no identifiable new stressors or dietary changes and no symptoms such as chest pain or shortness of breath. We will increase Norvasc  from 2.5mg  to 5mg  daily. She should continue monitoring her blood pressure at home and report if there is no improvement in two weeks. Recent CMP and CBC reviewed.       Relevant Medications   amLODipine  (NORVASC ) 5 MG tablet     Other   Left sided numbness   She has experienced recent left-sided numbness and cognitive/mobility issues following a COVID booster, initially thought to be mini-strokes. However, an MRI suggested a possible meningioma, with no recent falls or headaches reported. MRI was negative for stroke. She will await the scheduling and results of a cervical MRI and  continue taking daily baby aspirin , and following with neurology.        Return if symptoms worsen or fail to improve, for at your next scheduled visit.    Tinnie DELENA Harada, NP

## 2023-12-26 DIAGNOSIS — G35 Multiple sclerosis: Secondary | ICD-10-CM | POA: Diagnosis not present

## 2024-01-06 DIAGNOSIS — Z79899 Other long term (current) drug therapy: Secondary | ICD-10-CM | POA: Diagnosis not present

## 2024-01-06 DIAGNOSIS — G35 Multiple sclerosis: Secondary | ICD-10-CM | POA: Diagnosis not present

## 2024-01-13 ENCOUNTER — Ambulatory Visit
Admission: RE | Admit: 2024-01-13 | Discharge: 2024-01-13 | Disposition: A | Discharge status: Home / Self Care | Payer: Medicare Other | Source: Ambulatory Visit | Attending: Nurse Practitioner | Admitting: Nurse Practitioner

## 2024-01-13 ENCOUNTER — Encounter: Payer: Self-pay | Admitting: Family

## 2024-01-13 DIAGNOSIS — N958 Other specified menopausal and perimenopausal disorders: Secondary | ICD-10-CM | POA: Diagnosis not present

## 2024-01-13 DIAGNOSIS — E2839 Other primary ovarian failure: Secondary | ICD-10-CM | POA: Diagnosis not present

## 2024-01-13 DIAGNOSIS — M810 Age-related osteoporosis without current pathological fracture: Secondary | ICD-10-CM

## 2024-01-13 DIAGNOSIS — M8588 Other specified disorders of bone density and structure, other site: Secondary | ICD-10-CM | POA: Diagnosis not present

## 2024-02-18 ENCOUNTER — Encounter: Payer: Self-pay | Admitting: Nurse Practitioner

## 2024-02-18 ENCOUNTER — Ambulatory Visit: Payer: Medicare Other | Admitting: Nurse Practitioner

## 2024-02-18 VITALS — BP 128/82 | HR 76 | Temp 97.4°F | Ht 62.0 in | Wt 106.4 lb

## 2024-02-18 DIAGNOSIS — I10 Essential (primary) hypertension: Secondary | ICD-10-CM | POA: Diagnosis not present

## 2024-02-18 DIAGNOSIS — H5713 Ocular pain, bilateral: Secondary | ICD-10-CM

## 2024-02-18 DIAGNOSIS — M810 Age-related osteoporosis without current pathological fracture: Secondary | ICD-10-CM | POA: Diagnosis not present

## 2024-02-18 NOTE — Assessment & Plan Note (Signed)
 Chronic, not controlled. A recent DEXA scan indicates severe osteoporosis with a T-score of -3.8. Start Evenity (romosozumab) 210mg  subcutaneous injection once a month for 12 months to build bone. Continue Vitamin D 50,000 IU weekly and over-the-counter calcium supplementation. Check with insurance for coverage and cost of Evenity. Consider alternative osteoporosis medications if Evenity is not covered or cost-prohibitive.

## 2024-02-18 NOTE — Patient Instructions (Signed)
 It was great to see you!  Call your insurance and see how much Evenity 210 mg once a month would cost for 12 months max (2 105mg  doses)  Keep taking your other medications  Let's follow-up in 6 months, sooner if you have concerns.  If a referral was placed today, you will be contacted for an appointment. Please note that routine referrals can sometimes take up to 3-4 weeks to process. Please call our office if you haven't heard anything after this time frame.  Take care,  Rodman Pickle, NP

## 2024-02-18 NOTE — Progress Notes (Signed)
 Established Patient Office Visit  Subjective   Patient ID: Crystal Clark, female    DOB: 03-24-1956  Age: 68 y.o. MRN: [id number]  Chief Complaint  Patient presents with   Hypertension    Follow up     HPI  Discussed the use of AI scribe software for clinical note transcription with the patient, who gave verbal consent to proceed.  History of Present Illness   Crystal Clark, a patient with a history of osteoporosis, presents with discomfort in her eyes, a feeling of fullness, and headaches that started this week. She denies any blurred vision or redness in her eyes. The discomfort is mainly in her left eye. She also reports a change in her sleep pattern, waking up at around one o'clock in the morning. She has been monitoring her blood pressure at home, which has been stable. She has been taking vitamin D and calcium supplements. She denies chest pain and shortness of breath.        ROS See pertinent positives and negatives per HPI.    Objective:     BP 128/82 (BP Location: Left Arm, Patient Position: Sitting, Cuff Size: Small)   Pulse 76   Temp (!) 97.4 F (36.3 C)   Ht 5\' 2"  (1.575 m)   Wt 106 lb 6.4 oz (48.3 kg)   SpO2 98%   BMI 19.46 kg/m    Physical Exam Vitals and nursing note reviewed.  Constitutional:      General: She is not in acute distress.    Appearance: Normal appearance.  HENT:     Head: Normocephalic.  Eyes:     General:        Right eye: No discharge.        Left eye: No discharge.     Conjunctiva/sclera: Conjunctivae normal.  Cardiovascular:     Rate and Rhythm: Normal rate and regular rhythm.     Pulses: Normal pulses.     Heart sounds: Normal heart sounds.  Pulmonary:     Effort: Pulmonary effort is normal.     Breath sounds: Normal breath sounds.  Musculoskeletal:     Cervical back: Normal range of motion.  Skin:    General: Skin is warm.  Neurological:     General: No focal deficit present.     Mental Status: She is alert and oriented  to person, place, and time.  Psychiatric:        Mood and Affect: Mood normal.        Behavior: Behavior normal.        Thought Content: Thought content normal.        Judgment: Judgment normal.    The 10-year ASCVD risk score (Arnett DK, et al., 2019) is: 7.7%    Assessment & Plan:   Problem List Items Addressed This Visit       Cardiovascular and Mediastinum   Primary hypertension - Primary   Chronic, stable. Continue amlodipine 5mg  daily.         Musculoskeletal and Integument   Age-related osteoporosis without current pathological fracture   Chronic, not controlled. A recent DEXA scan indicates severe osteoporosis with a T-score of -3.8. Start Evenity (romosozumab) 210mg  subcutaneous injection once a month for 12 months to build bone. Continue Vitamin D 50,000 IU weekly and over-the-counter calcium supplementation. Check with insurance for coverage and cost of Evenity. Consider alternative osteoporosis medications if Evenity is not covered or cost-prohibitive.         Other   Discomfort  of both eyes   She experiences a new pressure sensation in the left eye without visual changes or redness. A recent eye exam 3 months ago resulted in new glasses. Recommend urgent follow-up with an eye doctor or neurologist for further evaluation.      Return in about 6 months (around 08/20/2024) for osteoporosis .    Gerre Scull, NP

## 2024-02-18 NOTE — Assessment & Plan Note (Signed)
Chronic, stable. Continue amlodipine 5mg daily. 

## 2024-02-18 NOTE — Assessment & Plan Note (Signed)
 She experiences a new pressure sensation in the left eye without visual changes or redness. A recent eye exam 3 months ago resulted in new glasses. Recommend urgent follow-up with an eye doctor or neurologist for further evaluation.

## 2024-02-21 ENCOUNTER — Telehealth: Payer: Self-pay | Admitting: Nurse Practitioner

## 2024-02-21 MED ORDER — ALENDRONATE SODIUM 70 MG PO TABS: 70.0000 mg | ORAL_TABLET | ORAL | 3 refills | Status: DC

## 2024-02-21 NOTE — Telephone Encounter (Signed)
 Insurance does not cover evenity. They prefer to start with fosomax which is a pill she takes once a week. Take this on an empty stomach and do not lay down for 30 minutes after taking. Continue vitamin D supplement.

## 2024-02-22 NOTE — Telephone Encounter (Signed)
 I called patient and left a message for a return call.

## 2024-02-22 NOTE — Telephone Encounter (Signed)
 I called and spoke with patient an notified her of below message. She will start on the Rx of Fosamax and would like for an appeal to be done as well.

## 2024-02-23 MED ORDER — ALENDRONATE SODIUM 70 MG PO TABS: 70.0000 mg | ORAL_TABLET | ORAL | 3 refills | Status: AC

## 2024-02-23 NOTE — Telephone Encounter (Signed)
 I called and spoke with patient and notified her of below message. She said that she will call to appeal it. Also patient said that she received a message from [facility] that her Rx of Fosamax was denied and a 90 day Rx can be sent to [facility].

## 2024-02-23 NOTE — Addendum Note (Signed)
 Addended by: Rodman Pickle A on: 02/23/2024 09:11 AM   Modules accepted: Orders

## 2024-02-23 NOTE — Telephone Encounter (Signed)
 Patient aware Rx sent to [facility].

## 2024-02-25 ENCOUNTER — Encounter: Payer: Self-pay | Admitting: Nurse Practitioner

## 2024-02-25 ENCOUNTER — Telehealth: Admitting: Nurse Practitioner

## 2024-02-25 ENCOUNTER — Telehealth: Payer: Self-pay | Admitting: Family Medicine

## 2024-02-25 ENCOUNTER — Ambulatory Visit: Payer: Self-pay | Admitting: Nurse Practitioner

## 2024-02-25 VITALS — Ht 62.0 in | Wt 106.0 lb

## 2024-02-25 DIAGNOSIS — U071 COVID-19: Secondary | ICD-10-CM | POA: Diagnosis not present

## 2024-02-25 MED ORDER — MOLNUPIRAVIR EUA 200MG CAPSULE: 4.0000 | ORAL_CAPSULE | Freq: Two times a day (BID) | ORAL | 0 refills | Status: AC

## 2024-02-25 NOTE — Telephone Encounter (Signed)
 This RN received call from pharmacist requesting medication for covid be switched to Paxlovid. This RN called on call provider and he stated he will review patient's chart and call in Paxlovid if warranted with medical history.

## 2024-02-25 NOTE — Telephone Encounter (Signed)
 Patient was scheduled for a virtual visit for 420pm today

## 2024-02-25 NOTE — Progress Notes (Signed)
 [facility] [address] [place] [address] Dept: [phone] Dept Fax: [phone]  Virtual Video Visit  I connected with Crystal Clark on 02/25/24 at  4:20 PM EDT by a video enabled telemedicine application and verified that I am speaking with the correct person using two identifiers.  Location patient: Home Location provider: Clinic Persons participating in the virtual visit: Patient; Rodman Pickle, NP; Malena Peer, CMA  I discussed the limitations of evaluation and management by telemedicine and the availability of in person appointments. The patient expressed understanding and agreed to proceed.  Chief Complaint  Patient presents with   Covid Positive    Took home covid test this morning, diarrhea, head congestion, body aches, no fever    SUBJECTIVE:  HPI: Crystal Clark is a 68 y.o. female who presents with positive home covid test.   Discussed the use of AI scribe software for clinical note transcription with the patient, who gave verbal consent to proceed.  History of Present Illness   She reports feeling fine until the previous day when she began to experience fatigue and a sensation of her head 'filling up' after a trip to the grocery store. She did not sleep well and has been feeling cold, but denies having a fever. She has developed diarrhea and a cough, and describes her head as being 'full' and feeling generally unwell. She has not experienced any shortness of breath or trouble breathing, but notes an increase in muscle spasms, which she attributes to her MS being triggered by her body fighting off an infection. She denies having a sore throat, but reports a severe headache. She has been taking Mucinex and Nyquil for symptom relief, but reports that these medications have not been effective. She took a home covid test and it came back positive. She denies sick contacts.       Patient Active  Problem List   Diagnosis Date Noted   Discomfort of both eyes 02/18/2024   Primary hypertension 12/21/2023   Left sided numbness 12/21/2023   Underweight 09/24/2023   Routine general medical examination at a health care facility 06/26/2023   Chronic fatigue 06/26/2023   Vitamin B12 deficiency 06/26/2023   Age-related osteoporosis without current pathological fracture 06/26/2023   Primary insomnia 05/26/2023   Depression, recurrent (HCC) 05/25/2023   Chronic right hip pain 05/25/2023   Seborrheic keratosis 05/25/2023   Chronic midline thoracic back pain 05/25/2023   Centrilobular emphysema (HCC) 05/25/2023   Forgetfulness 05/25/2023   Seizures (HCC) 05/25/2023   Raynaud's disease 09/03/2016   Angina pectoris (HCC) 09/02/2016   CAD S/P percutaneous coronary angioplasty    MS (multiple sclerosis) (HCC)    Dyslipidemia 12/23/2011    Past Surgical History:  Procedure Laterality Date   CARDIAC CATHETERIZATION  12/2011   CARDIAC CATHETERIZATION N/A 09/03/2016   Procedure: Left Heart Cath and Coronary Angiography;  Surgeon: Marykay Lex, MD;  Location: [facility] INVASIVE CV LAB;  Service: Cardiovascular;  Laterality: N/A;   CESAREAN SECTION  X 2   CORONARY ANGIOPLASTY WITH STENT PLACEMENT  03/2005   CORONARY ANGIOPLASTY WITH STENT PLACEMENT  07/18/2014   ISRS- Mid LAD: Promus Premier 2.75 x 16 mm DES    KNEE ARTHROSCOPY Left    LEFT HEART CATHETERIZATION WITH CORONARY ANGIOGRAM N/A 12/23/2011   Procedure: LEFT HEART CATHETERIZATION WITH CORONARY ANGIOGRAM;  Surgeon: Iran Ouch, MD;  Location: [facility] CATH LAB;  Service: Cardiovascular;  Laterality: N/A;   LEFT HEART CATHETERIZATION  WITH CORONARY ANGIOGRAM N/A 07/18/2014   Procedure: LEFT HEART CATHETERIZATION WITH CORONARY ANGIOGRAM;  Surgeon: Lesleigh Noe, MD;  Location: [facility] CATH LAB;  Service: Cardiovascular;  Laterality: N/A;    Family History  Problem Relation Age of Onset   Colon polyps Mother    Alzheimer's disease Mother     Hypertension Mother    Coronary artery disease Father        s/p CABG at 39   Colon cancer Neg Hx    Stomach cancer Neg Hx    Breast cancer Neg Hx     Social History   Tobacco Use   Smoking status: Former    Current packs/day: 0.00    Average packs/day: 0.5 packs/day for 32.0 years (16.0 ttl pk-yrs)    Types: Cigars, Cigarettes    Start date: 01/01/1985    Quit date: 01/01/2017    Years since quitting: 7.1   Smokeless tobacco: Never  Vaping Use   Vaping status: Never Used  Substance Use Topics   Alcohol use: No   Drug use: No     Current Outpatient Medications:    albuterol (VENTOLIN HFA) 108 (90 Base) MCG/ACT inhaler, Inhale 1 puff into the lungs every 4 (four) hours as needed., Disp: , Rfl:    alendronate (FOSAMAX) 70 MG tablet, Take 1 tablet (70 mg total) by mouth every 7 (seven) days. Take with a full glass of water on an empty stomach., Disp: 12 tablet, Rfl: 3   amLODipine (NORVASC) 5 MG tablet, Take 1 tablet (5 mg total) by mouth daily., Disp: 90 tablet, Rfl: 1   aspirin EC 81 MG tablet, Take 81 mg by mouth daily.  , Disp: , Rfl:    atorvastatin (LIPITOR) 20 MG tablet, Take 1 tablet (20 mg total) by mouth every evening., Disp: 90 tablet, Rfl: 3   baclofen (LIORESAL) 10 MG tablet, Take 10 mg by mouth as needed., Disp: , Rfl:    clopidogrel (PLAVIX) 75 MG tablet, Take 1 tablet (75 mg total) by mouth daily., Disp: 90 tablet, Rfl: 3   cyanocobalamin (VITAMIN B12) 1000 MCG/ML injection, Inject 1,000 mcg into the muscle once a week., Disp: , Rfl:    ezetimibe (ZETIA) 10 MG tablet, Take 1 tablet (10 mg total) by mouth daily., Disp: 90 tablet, Rfl: 3   FLUoxetine (PROZAC) 20 MG tablet, TAKE 1 TABLET(20 MG) BY MOUTH DAILY, Disp: 90 tablet, Rfl: 1   Fluticasone-Umeclidin-Vilant 100-62.5-25 MCG/ACT AEPB, Inhale into the lungs., Disp: , Rfl:    folic acid (FOLVITE) 1 MG tablet, Take 1 tablet by mouth daily., Disp: , Rfl:    lamoTRIgine (LAMICTAL) 25 MG tablet, Take 50 mg by mouth 2  (two) times daily., Disp: , Rfl:    lidocaine (LIDODERM) 5 %, Place 2 patches onto the skin daily as needed. For pain Remove & Discard patch within 12 hours or as directed by MD , Disp: , Rfl:    mirtazapine (REMERON) 7.5 MG tablet, TAKE 1 TABLET(7.5 MG) BY MOUTH AT BEDTIME, Disp: 90 tablet, Rfl: 1   molnupiravir EUA (LAGEVRIO) 200 mg CAPS capsule, Take 4 capsules (800 mg total) by mouth 2 (two) times daily for 5 days., Disp: 40 capsule, Rfl: 0   nitroGLYCERIN (NITROSTAT) 0.4 MG SL tablet, DISSOLVE 1 TABLET UNDER THE TONGUE AS DIRECTED (NOT TO  EXCEED 3 TABLETS IN 15  MINUTES, CALL 911 IF CHEST  PAIN PERSISTS), Disp: 25 tablet, Rfl: 3   ondansetron (ZOFRAN-ODT) 4 MG disintegrating tablet, Take 1  tablet (4 mg total) by mouth every 8 (eight) hours as needed for nausea or vomiting., Disp: 15 tablet, Rfl: 0   pantoprazole (PROTONIX) 20 MG tablet, Take 1 tablet by mouth daily., Disp: , Rfl:    pantoprazole (PROTONIX) 40 MG tablet, Take 1 tablet (40 mg total) by mouth 2 (two) times daily., Disp: 180 tablet, Rfl: 2   ranolazine (RANEXA) 500 MG 12 hr tablet, Take 1 tablet (500 mg total) by mouth 2 (two) times daily., Disp: 180 tablet, Rfl: 3   Teriflunomide 14 MG TABS, Take 14 mg by mouth daily., Disp: , Rfl:    traMADol (ULTRAM) 50 MG tablet, Take 50 mg by mouth every 6 (six) hours as needed for moderate pain. , Disp: , Rfl:    Vitamin D, Ergocalciferol, (DRISDOL) 1.25 MG (50000 UNIT) CAPS capsule, Take 50,000 Units by mouth every 7 (seven) days., Disp: , Rfl:    VUMERITY 231 MG CPDR, Take 2 capsules by mouth 2 (two) times daily., Disp: , Rfl:    zolpidem (AMBIEN CR) 12.5 MG CR tablet, Take 12.5 mg by mouth at bedtime as needed for sleep., Disp: , Rfl:    zolpidem (AMBIEN) 10 MG tablet, Take 10 mg by mouth at bedtime., Disp: , Rfl:   Allergies  Allergen Reactions   Morphine And Codeine Other (See Comments)    Pt states "It increases my angina"   Simvastatin Other (See Comments)    "makes MS worse"    Prednisone     All steroids     ROS: See pertinent positives and negatives per HPI.  OBSERVATIONS/OBJECTIVE:  VITALS per patient if applicable: Today's Vitals   02/25/24 1616  Weight: 106 lb (48.1 kg)  Height: 5\' 2"  (1.575 m)   Body mass index is 19.39 kg/m.    GENERAL: Alert and oriented. Appears well and in no acute distress.  HEENT: Atraumatic. Conjunctiva clear. No obvious abnormalities on inspection of external nose and ears. Nasal congestion present.   NECK: Normal movements of the head and neck.  LUNGS: On inspection, no signs of respiratory distress. Breathing rate appears normal. No obvious gross SOB, gasping or wheezing, and no conversational dyspnea.  CV: No obvious cyanosis.  MS: Moves all visible extremities without noticeable abnormality.  PSYCH/NEURO: Pleasant and cooperative. No obvious depression or anxiety. Speech and thought processing grossly intact.  ASSESSMENT AND PLAN:     COVID-19 infection   Acute COVID-19 is confirmed with symptoms of fatigue, headache, cough, chills, diarrhea, and nasal congestion. Her autoimmune disorder and multiple sclerosis may worsen symptoms. Molnupiravir is prescribed to reduce severity and duration. Paxlovid contraindicated with ranexa. Side effects were discussed, and she was advised to maintain hydration and rest. Isolation is required until symptoms improve and she is fever and chill-free for two days. Mask-wearing and hand hygiene are advised when around others.       I discussed the assessment and treatment plan with the patient. The patient was provided an opportunity to ask questions and all were answered. The patient agreed with the plan and demonstrated an understanding of the instructions.   The patient was advised to call back or seek an in-person evaluation if the symptoms worsen or if the condition fails to improve as anticipated.   Gerre Scull, NP

## 2024-02-25 NOTE — Telephone Encounter (Signed)
 On call note: After hours pt assistance contacted me to request prescription for Paxlovid for patient because molnupiravir was going to cost her $1000. Reviewed chart and most recent encounter from today.   Paxlovid contraindicated due to patient taking Ranexa. I called pt and discussed this with her. She expressed understanding and we discussed proceeding/continuing with symptomatic care. Signed:  Santiago Bumpers, MD           02/25/2024

## 2024-02-25 NOTE — Patient Instructions (Signed)
 It was great to see you!  Start molnupiravir 4 capsules twice a day for 5 days  Drink plenty of fluids and get rest  You can continue mucinex and day/nyquil  Let's follow-up if your symptoms worsen or don't improve.   Take care,  Rodman Pickle, NP

## 2024-02-28 NOTE — Telephone Encounter (Signed)
 Noted.

## 2024-02-28 NOTE — Telephone Encounter (Signed)
 I called and spoke with patient this morning and she has picked up and started on medication.

## 2024-02-28 NOTE — Telephone Encounter (Signed)
 I called and spoke with patient and she had Rx of Lagevrio sent to [facility] on [address] and started on.

## 2024-03-07 ENCOUNTER — Telehealth: Payer: Self-pay

## 2024-03-07 ENCOUNTER — Other Ambulatory Visit (HOSPITAL_COMMUNITY): Payer: Self-pay

## 2024-03-07 NOTE — Telephone Encounter (Signed)
 Received fax from [facility] that a prior auth is needed for coverage of Fluoxetine Tab 20mg 

## 2024-03-07 NOTE — Telephone Encounter (Signed)
 Pharmacy Patient Advocate Encounter   Received notification from Pt Calls Messages that prior authorization for Fluoxetine 20mg  tabs is required/requested.   Insurance verification completed.   The patient is insured through [facility] .   Per test claim: PA required; PA submitted to above mentioned insurance via CoverMyMeds Key/confirmation #/EOC [id number] Status is pending

## 2024-03-08 ENCOUNTER — Other Ambulatory Visit (HOSPITAL_COMMUNITY): Payer: Self-pay

## 2024-03-08 NOTE — Telephone Encounter (Signed)
 Pharmacy Patient Advocate Encounter  Received notification from [facility] that Prior Authorization for Fluoxetine 20mg  tabs  has been APPROVED from 03/08/24 to 03/08/25   PA #/Case ID/Reference #: [id number]

## 2024-03-08 NOTE — Telephone Encounter (Signed)
 Called Pt no answer left VM for call back and message was sent to MyChart

## 2024-03-08 NOTE — Telephone Encounter (Signed)
 Copied from CRM [id number]. Topic: Clinical - Medication Question >> Mar 08, 2024 12:33 PM [place] E wrote: Reason for CRM: [place] from [place] called stating that the patient's FLUoxetine (PROZAC) 20 MG tablet medication coverage has been approved, but patient is wanting clarification and she stated her medications get approved through different insurance, so she is not sure why it was approved through [place]. Callback number for patient is [phone] for clarification.

## 2024-03-13 ENCOUNTER — Other Ambulatory Visit: Payer: Self-pay | Admitting: Nurse Practitioner

## 2024-03-13 ENCOUNTER — Encounter: Payer: Self-pay | Admitting: Nurse Practitioner

## 2024-03-13 DIAGNOSIS — M810 Age-related osteoporosis without current pathological fracture: Secondary | ICD-10-CM

## 2024-04-05 DIAGNOSIS — G35 Multiple sclerosis: Secondary | ICD-10-CM | POA: Diagnosis not present

## 2024-04-05 DIAGNOSIS — Z79899 Other long term (current) drug therapy: Secondary | ICD-10-CM | POA: Diagnosis not present

## 2024-04-05 DIAGNOSIS — R5383 Other fatigue: Secondary | ICD-10-CM | POA: Diagnosis not present

## 2024-04-05 DIAGNOSIS — G4700 Insomnia, unspecified: Secondary | ICD-10-CM | POA: Diagnosis not present

## 2024-04-05 DIAGNOSIS — E559 Vitamin D deficiency, unspecified: Secondary | ICD-10-CM | POA: Diagnosis not present

## 2024-05-19 ENCOUNTER — Other Ambulatory Visit: Payer: Self-pay | Admitting: Nurse Practitioner

## 2024-05-19 NOTE — Telephone Encounter (Signed)
 Requesting: MIRTAZAPINE  7.5MG  TABLETS  Last Visit: 02/18/2024 Next Visit: 09/08/2024 Last Refill: 11/22/2023  Please Advise

## 2024-06-05 ENCOUNTER — Other Ambulatory Visit: Payer: Self-pay | Admitting: Nurse Practitioner

## 2024-06-05 NOTE — Telephone Encounter (Signed)
 Requesting: FLUOXETINE  20MG  TABLETS  Last Visit: 02/18/2024 Next Visit: 09/08/2024 Last Refill: 11/22/2023  Please Advise

## 2024-06-23 ENCOUNTER — Other Ambulatory Visit: Payer: Self-pay | Admitting: Nurse Practitioner

## 2024-06-23 NOTE — Telephone Encounter (Signed)
 Requesting:  AMLODIPINE  BESYLATE 5MG  TABLETS  Last Visit: 02/18/2024 Next Visit: 09/08/2024 Last Refill: 12/21/2023  Please Advise

## 2024-07-12 DIAGNOSIS — Z79899 Other long term (current) drug therapy: Secondary | ICD-10-CM | POA: Diagnosis not present

## 2024-07-12 DIAGNOSIS — G35 Multiple sclerosis: Secondary | ICD-10-CM | POA: Diagnosis not present

## 2024-07-12 DIAGNOSIS — R5383 Other fatigue: Secondary | ICD-10-CM | POA: Diagnosis not present

## 2024-07-12 DIAGNOSIS — G4700 Insomnia, unspecified: Secondary | ICD-10-CM | POA: Diagnosis not present

## 2024-08-28 ENCOUNTER — Ambulatory Visit (INDEPENDENT_AMBULATORY_CARE_PROVIDER_SITE_OTHER): Payer: Medicare Other

## 2024-08-28 DIAGNOSIS — Z0000 Encounter for general adult medical examination without abnormal findings: Secondary | ICD-10-CM

## 2024-08-28 NOTE — Progress Notes (Signed)
 Subjective:   Crystal Clark is a 68 y.o. who presents for a Medicare Wellness preventive visit.  As a reminder, Annual Wellness Visits don't include a physical exam, and some assessments may be limited, especially if this visit is performed virtually. We may recommend an in-person follow-up visit with your provider if needed.  Visit Complete: Virtual I connected with  Crystal Clark on 08/28/24 by a audio enabled telemedicine application and verified that I am speaking with the correct person using two identifiers.  Patient Location: Home  Provider Location: Office/Clinic  I discussed the limitations of evaluation and management by telemedicine. The patient expressed understanding and agreed to proceed.  Vital Signs: Because this visit was a virtual/telehealth visit, some criteria may be missing or patient reported. Any vitals not documented were not able to be obtained and vitals that have been documented are patient reported.  VideoError- Librarian, academic were attempted between this provider and patient, however failed, due to patient having technical difficulties OR patient did not have access to video capability.  We continued and completed visit with audio only.   Persons Participating in Visit: Patient.  AWV Questionnaire: Yes: Patient Medicare AWV questionnaire was completed by the patient on 08/28/2024; I have confirmed that all information answered by patient is correct and no changes since this date.  Cardiac Risk Factors include: advanced age (>68men, >52 women);dyslipidemia     Objective:    Today's Vitals   08/28/24 1126  PainSc: 4    There is no height or weight on file to calculate BMI.     08/28/2024   11:33 AM 08/23/2023   11:25 AM 09/03/2016   12:06 AM 09/02/2016    1:18 PM 04/17/2016   11:26 AM 07/18/2014    3:19 PM 07/18/2014    7:41 AM  Advanced Directives  Does Patient Have a Medical Advance Directive? Yes Yes Yes  Yes  No    Patient has advance directive, copy not in chart   Type of Advance Directive Healthcare Power of [place];Living will Healthcare Power of [place];Living will Healthcare Power of [place];Living will  Healthcare Power of [place];Living will    Healthcare Power of [place];Living will   Does patient want to make changes to medical advance directive?       No change requested   Copy of Healthcare Power of Attorney in Chart? No - copy requested No - copy requested  No - copy requested      Pre-existing out of facility DNR order (yellow form or pink MOST form)      No       Data saved with a previous flowsheet row definition    Current Medications (verified) Outpatient Encounter Medications as of 08/28/2024  Medication Sig   albuterol  (VENTOLIN  HFA) 108 (90 Base) MCG/ACT inhaler Inhale 1 puff into the lungs every 4 (four) hours as needed.   alendronate  (FOSAMAX ) 70 MG tablet Take 1 tablet (70 mg total) by mouth every 7 (seven) days. Take with a full glass of water on an empty stomach.   amLODipine  (NORVASC ) 5 MG tablet TAKE 1 TABLET(5 MG) BY MOUTH DAILY   aspirin  EC 81 MG tablet Take 81 mg by mouth daily.     atorvastatin  (LIPITOR ) 20 MG tablet Take 1 tablet (20 mg total) by mouth every evening.   baclofen  (LIORESAL ) 10 MG tablet Take 10 mg by mouth as needed.   clopidogrel  (PLAVIX ) 75 MG tablet Take 1 tablet (75 mg total) by  mouth daily.   cyanocobalamin  (VITAMIN B12) 1000 MCG/ML injection Inject 1,000 mcg into the muscle once a week.   ezetimibe  (ZETIA ) 10 MG tablet Take 1 tablet (10 mg total) by mouth daily.   FLUoxetine  (PROZAC ) 20 MG tablet TAKE 1 TABLET(20 MG) BY MOUTH DAILY   Fluticasone-Umeclidin-Vilant 100-62.5-25 MCG/ACT AEPB Inhale into the lungs.   folic acid  (FOLVITE ) 1 MG tablet Take 1 tablet by mouth daily.   lamoTRIgine  (LAMICTAL ) 25 MG tablet Take 50 mg by mouth 2 (two) times daily.   lidocaine  (LIDODERM ) 5 % Place 2 patches onto the skin daily as needed. For pain Remove &  Discard patch within 12 hours or as directed by MD    mirtazapine  (REMERON ) 7.5 MG tablet TAKE 1 TABLET(7.5 MG) BY MOUTH AT BEDTIME   nitroGLYCERIN  (NITROSTAT ) 0.4 MG SL tablet DISSOLVE 1 TABLET UNDER THE TONGUE AS DIRECTED (NOT TO  EXCEED 3 TABLETS IN 15  MINUTES, CALL 911 IF CHEST  PAIN PERSISTS)   pantoprazole  (PROTONIX ) 20 MG tablet Take 1 tablet by mouth daily. (Patient taking differently: Take 1 tablet by mouth as needed.)   ranolazine  (RANEXA ) 500 MG 12 hr tablet Take 1 tablet (500 mg total) by mouth 2 (two) times daily.   Teriflunomide 14 MG TABS Take 14 mg by mouth daily.   traMADol  (ULTRAM ) 50 MG tablet Take 50 mg by mouth every 6 (six) hours as needed for moderate pain.    Vitamin D , Ergocalciferol , (DRISDOL ) 1.25 MG (50000 UNIT) CAPS capsule Take 50,000 Units by mouth every 7 (seven) days.   zolpidem  (AMBIEN  CR) 12.5 MG CR tablet Take 12.5 mg by mouth at bedtime as needed for sleep.   ondansetron  (ZOFRAN -ODT) 4 MG disintegrating tablet Take 1 tablet (4 mg total) by mouth every 8 (eight) hours as needed for nausea or vomiting.   pantoprazole  (PROTONIX ) 40 MG tablet Take 1 tablet (40 mg total) by mouth 2 (two) times daily. (Patient not taking: Reported on 08/28/2024)   VUMERITY 231 MG CPDR Take 2 capsules by mouth 2 (two) times daily. (Patient not taking: Reported on 08/28/2024)   zolpidem  (AMBIEN ) 10 MG tablet Take 10 mg by mouth at bedtime.   No facility-administered encounter medications on file as of 08/28/2024.    Allergies (verified) Morphine  and codeine, Simvastatin , and Prednisone   History: Past Medical History:  Diagnosis Date   Anemia    Chronic mid back pain    T7-8 herniated disc that cannot be repaired   Coronary artery disease    a) s/p stent to LAD in 2006 in [place], [place]. b) unchanged cath 2013. c) tandem 70% lesions distal LAD with ISR s/p balloon angio and DES 07/2014. d) stable cath 08/2016 without obstructive lesion.   Dyslipidemia    GERD (gastroesophageal  reflux disease)    History of blood transfusion 1959   MS (multiple sclerosis) (HCC)    a) since 1997. b) cannot take Imdur  due to medication interactions.   Raynaud's disease dx'd 2005   bb contraindicated   Tobacco use    30+ pack years   Past Surgical History:  Procedure Laterality Date   CARDIAC CATHETERIZATION  12/2011   CARDIAC CATHETERIZATION N/A 09/03/2016   Procedure: Left Heart Cath and Coronary Angiography;  Surgeon: Alm LELON Clay, MD;  Location: [facility] INVASIVE CV LAB;  Service: Cardiovascular;  Laterality: N/A;   CESAREAN SECTION  X 2   CORONARY ANGIOPLASTY WITH STENT PLACEMENT  03/2005   CORONARY ANGIOPLASTY WITH STENT PLACEMENT  07/18/2014  ISRS- Mid LAD: Promus Premier 2.75 x 16 mm DES    KNEE ARTHROSCOPY Left    LEFT HEART CATHETERIZATION WITH CORONARY ANGIOGRAM N/A 12/23/2011   Procedure: LEFT HEART CATHETERIZATION WITH CORONARY ANGIOGRAM;  Surgeon: Deatrice DELENA Cage, MD;  Location: [facility] CATH LAB;  Service: Cardiovascular;  Laterality: N/A;   LEFT HEART CATHETERIZATION WITH CORONARY ANGIOGRAM N/A 07/18/2014   Procedure: LEFT HEART CATHETERIZATION WITH CORONARY ANGIOGRAM;  Surgeon: Victory LELON Claudene DOUGLAS, MD;  Location: [facility] CATH LAB;  Service: Cardiovascular;  Laterality: N/A;   Family History  Problem Relation Age of Onset   Colon polyps Mother    Alzheimer's disease Mother    Hypertension Mother    Coronary artery disease Father        s/p CABG at 2   Colon cancer Neg Hx    Stomach cancer Neg Hx    Breast cancer Neg Hx    Social History   Socioeconomic History   Marital status: Married    Spouse name: Not on file   Number of children: 2   Years of education: Not on file   Highest education level: Associate degree: academic program  Occupational History   Occupation: disabled    Comment: Charity fundraiser for Pulm/cardiac rehab  Tobacco Use   Smoking status: Former    Current packs/day: 0.00    Average packs/day: 0.5 packs/day for 32.0 years (16.0 ttl pk-yrs)    Types: Cigars,  Cigarettes    Start date: 01/01/1985    Quit date: 01/01/2017    Years since quitting: 7.6   Smokeless tobacco: Never  Vaping Use   Vaping status: Never Used  Substance and Sexual Activity   Alcohol use: No   Drug use: No   Sexual activity: Yes    Birth control/protection: Post-menopausal  Other Topics Concern   Not on file  Social History Narrative   Not on file   Social Drivers of Health   Financial Resource Strain: Low Risk  (08/28/2024)   Overall Financial Resource Strain (CARDIA)    Difficulty of Paying Living Expenses: Not hard at all  Food Insecurity: No Food Insecurity (08/28/2024)   Hunger Vital Sign    Worried About Running Out of Food in the Last Year: Never true    Ran Out of Food in the Last Year: Never true  Transportation Needs: No Transportation Needs (08/28/2024)   PRAPARE - Administrator, Civil Service (Medical): No    Lack of Transportation (Non-Medical): No  Physical Activity: Inactive (08/28/2024)   Exercise Vital Sign    Days of Exercise per Week: 0 days    Minutes of Exercise per Session: 0 min  Stress: No Stress Concern Present (08/28/2024)   [employer] of Occupational Health - Occupational Stress Questionnaire    Feeling of Stress: Not at all  Social Connections: Moderately Isolated (08/28/2024)   Social Connection and Isolation Panel    Frequency of Communication with Friends and Family: More than three times a week    Frequency of Social Gatherings with Friends and Family: Once a week    Attends Religious Services: Never    Database administrator or Organizations: No    Attends Engineer, structural: Not on file    Marital Status: Married    Tobacco Counseling Counseling given: Not Answered    Clinical Intake:  Pre-visit preparation completed: Yes  Pain : 0-10 Pain Score: 4  Pain Type: Chronic pain Pain Location: Back Pain Descriptors / Indicators: Aching  Pain Onset: More than a month ago Pain Frequency:  Constant     Nutritional Risks: None Diabetes: No  Lab Results  Component Value Date   HGBA1C 5.7 (H) 12/22/2011     How often do you need to have someone help you when you read instructions, pamphlets, or other written materials from your doctor or pharmacy?: 1 - Never  Interpreter Needed?: No  Information entered by :: NAllen LPN   Activities of Daily Living     08/28/2024   10:15 AM  In your present state of health, do you have any difficulty performing the following activities:  Hearing? 0  Vision? 0  Difficulty concentrating or making decisions? 0  Walking or climbing stairs? 1  Comment due to MS  Dressing or bathing? 0  Doing errands, shopping? 0  Preparing Food and eating ? N  Using the Toilet? N  In the past six months, have you accidently leaked urine? N  Do you have problems with loss of bowel control? N  Managing your Medications? N  Managing your Finances? N  Housekeeping or managing your Housekeeping? N    Patient Care Team: Nedra Tinnie LABOR, NP as PCP - General (Internal Medicine) Lavona Agent, MD as PCP - Cardiology (Cardiology) Fayette Norleen LABOR, MD as Referring Physician Emilio Anes, PA-C as Physician Assistant (Neurosurgery)  I have updated your Care Teams any recent Medical Services you may have received from other providers in the past year.     Assessment:   This is a routine wellness examination for Crystal Clark.  Hearing/Vision screen Hearing Screening - Comments:: Denies hearing issues Vision Screening - Comments:: Regular eye exams, [facility]   Goals Addressed             This Visit's Progress    Patient Stated       08/28/2024, maintain current health       Depression Screen     08/28/2024   11:34 AM 12/21/2023   11:00 AM 08/23/2023   11:28 AM 05/25/2023    8:14 AM  PHQ 2/9 Scores  PHQ - 2 Score 0 0 0 2  PHQ- 9 Score 3 6 8 12     Fall Risk     08/28/2024   10:15 AM 12/21/2023   11:00 AM 08/23/2023   11:26 AM 06/26/2023     6:35 PM 05/25/2023    8:14 AM  Fall Risk   Falls in the past year? 1 1 1 1 1   Comment just falls due to MS  due to MS    Number falls in past yr: 1 1 1 1 1   Injury with Fall? 1 0 0 0 1  Risk for fall due to : History of fall(s);Medication side effect History of fall(s) History of fall(s);Medication side effect History of fall(s);Impaired balance/gait History of fall(s)  Follow up Falls prevention discussed;Falls evaluation completed Falls evaluation completed Falls evaluation completed;Falls prevention discussed Falls evaluation completed;Education provided;Falls prevention discussed Falls evaluation completed    MEDICARE RISK AT HOME:  Medicare Risk at Home Any stairs in or around the home?: (Patient-Rptd) Yes If so, are there any without handrails?: (Patient-Rptd) Yes Home free of loose throw rugs in walkways, pet beds, electrical cords, etc?: (Patient-Rptd) No Adequate lighting in your home to reduce risk of falls?: (Patient-Rptd) Yes Life alert?: (Patient-Rptd) No Use of a cane, walker or w/c?: (Patient-Rptd) No Grab bars in the bathroom?: (Patient-Rptd) No Shower chair or bench in shower?: (Patient-Rptd) No Elevated toilet seat or a  handicapped toilet?: (Patient-Rptd) No  TIMED UP AND GO:  Was the test performed?  No  Cognitive Function: 6CIT completed        08/28/2024   11:39 AM 08/23/2023   11:30 AM  6CIT Screen  What Year? 0 points 0 points  What month? 0 points 0 points  What time? 0 points 0 points  Count back from 20 0 points 0 points  Months in reverse 0 points 0 points  Repeat phrase 0 points 2 points  Total Score 0 points 2 points    Immunizations Immunization History  Administered Date(s) Administered   Influenza Whole 10/23/2008, 09/13/2009   PFIZER(Purple Top)SARS-COV-2 Vaccination 02/23/2020, 03/15/2020, 10/01/2020    Screening Tests Health Maintenance  Topic Date Due   DTaP/Tdap/Td (1 - Tdap) Never done   Influenza Vaccine  07/14/2024    Pneumococcal Vaccine: 50+ Years (1 of 2 - PCV) 11/04/2024 (Originally 06/09/1975)   Mammogram  06/27/2025   Medicare Annual Wellness (AWV)  08/28/2025   Colonoscopy  04/21/2027   DEXA SCAN  Completed   Hepatitis C Screening  Completed   HPV VACCINES  Aged Out   Meningococcal B Vaccine  Aged Out   COVID-19 Vaccine  Discontinued   Zoster Vaccines- Shingrix  Discontinued    Health Maintenance Items Addressed: Declines vaccines  Additional Screening:  Vision Screening: Recommended annual ophthalmology exams for early detection of glaucoma and other disorders of the eye. Is the patient up to date with their annual eye exam?  Yes  Who is the provider or what is the name of the office in which the patient attends annual eye exams? [facility]  Dental Screening: Recommended annual dental exams for proper oral hygiene  Community Resource Referral / Chronic Care Management: CRR required this visit?  No   CCM required this visit?  No   Plan:    I have personally reviewed and noted the following in the patient's chart:   Medical and social history Use of alcohol, tobacco or illicit drugs  Current medications and supplements including opioid prescriptions. Patient is not currently taking opioid prescriptions. Functional ability and status Nutritional status Physical activity Advanced directives List of other physicians Hospitalizations, surgeries, and ER visits in previous 12 months Vitals Screenings to include cognitive, depression, and falls Referrals and appointments  In addition, I have reviewed and discussed with patient certain preventive protocols, quality metrics, and best practice recommendations. A written personalized care plan for preventive services as well as general preventive health recommendations were provided to patient.   Ardella FORBES Dawn, LPN   [id number]   After Visit Summary: (MyChart) Due to this being a telephonic visit, the after visit summary with  patients personalized plan was offered to patient via MyChart   Notes: Nothing significant to report at this time.

## 2024-08-28 NOTE — Patient Instructions (Signed)
 Crystal Clark,  Thank you for taking the time for your Medicare Wellness Visit. I appreciate your continued commitment to your health goals. Please review the care plan we discussed, and feel free to reach out if I can assist you further.  Medicare recommends these wellness visits once per year to help you and your care team stay ahead of potential health issues. These visits are designed to focus on prevention, allowing your provider to concentrate on managing your acute and chronic conditions during your regular appointments.  Please note that Annual Wellness Visits do not include a physical exam. Some assessments may be limited, especially if the visit was conducted virtually. If needed, we may recommend a separate in-person follow-up with your provider.  Ongoing Care Seeing your primary care provider every 3 to 6 months helps us  monitor your health and provide consistent, personalized care.   Referrals If a referral was made during today's visit and you haven't received any updates within two weeks, please contact the referred provider directly to check on the status.  Recommended Screenings:  Health Maintenance  Topic Date Due   DTaP/Tdap/Td vaccine (1 - Tdap) Never done   Flu Shot  07/14/2024   Pneumococcal Vaccine for age over 28 (1 of 2 - PCV) 11/04/2024*   Breast Cancer Screening  06/27/2025   Medicare Annual Wellness Visit  08/28/2025   Colon Cancer Screening  04/21/2027   DEXA scan (bone density measurement)  Completed   Hepatitis C Screening  Completed   HPV Vaccine  Aged Out   Meningitis B Vaccine  Aged Out   COVID-19 Vaccine  Discontinued   Zoster (Shingles) Vaccine  Discontinued  *Topic was postponed. The date shown is not the original due date.       08/28/2024   11:33 AM  Advanced Directives  Does Patient Have a Medical Advance Directive? Yes  Type of Estate agent of [place];Living will  Copy of Healthcare Power of Attorney in Chart? No -  copy requested   Advance Care Planning is important because it: Ensures you receive medical care that aligns with your values, goals, and preferences. Provides guidance to your family and loved ones, reducing the emotional burden of decision-making during critical moments.  Vision: Annual vision screenings are recommended for early detection of glaucoma, cataracts, and diabetic retinopathy. These exams can also reveal signs of chronic conditions such as diabetes and high blood pressure.  Dental: Annual dental screenings help detect early signs of oral cancer, gum disease, and other conditions linked to overall health, including heart disease and diabetes.  Please see the attached documents for additional preventive care recommendations.

## 2024-09-08 ENCOUNTER — Ambulatory Visit: Admitting: Nurse Practitioner

## 2024-11-02 ENCOUNTER — Telehealth: Payer: Self-pay | Admitting: Nurse Practitioner

## 2024-11-02 NOTE — Telephone Encounter (Signed)
LVM to schedule F/U visit.

## 2024-11-02 NOTE — Telephone Encounter (Signed)
 Noted

## 2024-11-04 ENCOUNTER — Other Ambulatory Visit: Payer: Self-pay | Admitting: Nurse Practitioner

## 2024-11-06 NOTE — Telephone Encounter (Signed)
 Requesting: CLOPIDOGREL  75MG  TABLETS  Last Visit: 02/18/2024 Next Visit: Visit date not found Last Refill: 11/05/2023  Please Advise

## 2024-11-13 ENCOUNTER — Telehealth: Payer: Self-pay

## 2024-11-13 NOTE — Telephone Encounter (Signed)
 Left VM about physical appt set (needed before end of year-RAF related).

## 2024-11-22 ENCOUNTER — Other Ambulatory Visit: Payer: Self-pay | Admitting: Nurse Practitioner

## 2024-11-22 NOTE — Telephone Encounter (Signed)
 Requesting: MIRTAZAPINE  7.5MG  TABLETS  Last Visit: 02/18/2024 Next Visit: 11/30/2024 Last Refill: 05/19/2024  Please Advise

## 2024-11-22 NOTE — Telephone Encounter (Signed)
 Requesting: RANOLAZINE  500MG  ER TABLETS  Last Visit: 02/18/2024 Next Visit: 11/30/2024 Last Refill: 11/05/2023  Please Advise

## 2024-11-24 ENCOUNTER — Ambulatory Visit: Admitting: Nurse Practitioner

## 2024-11-30 ENCOUNTER — Ambulatory Visit: Admitting: Nurse Practitioner

## 2024-11-30 ENCOUNTER — Encounter: Payer: Self-pay | Admitting: Nurse Practitioner

## 2024-11-30 ENCOUNTER — Ambulatory Visit: Payer: Self-pay | Admitting: Nurse Practitioner

## 2024-11-30 ENCOUNTER — Telehealth: Payer: Self-pay

## 2024-11-30 VITALS — BP 124/80 | HR 77 | Temp 97.6°F | Ht 62.0 in | Wt 112.6 lb

## 2024-11-30 DIAGNOSIS — E538 Deficiency of other specified B group vitamins: Secondary | ICD-10-CM | POA: Diagnosis not present

## 2024-11-30 DIAGNOSIS — R569 Unspecified convulsions: Secondary | ICD-10-CM

## 2024-11-30 DIAGNOSIS — G35D Multiple sclerosis, unspecified: Secondary | ICD-10-CM | POA: Diagnosis not present

## 2024-11-30 DIAGNOSIS — I10 Essential (primary) hypertension: Secondary | ICD-10-CM

## 2024-11-30 DIAGNOSIS — M810 Age-related osteoporosis without current pathological fracture: Secondary | ICD-10-CM

## 2024-11-30 DIAGNOSIS — F5101 Primary insomnia: Secondary | ICD-10-CM | POA: Diagnosis not present

## 2024-11-30 DIAGNOSIS — F339 Major depressive disorder, recurrent, unspecified: Secondary | ICD-10-CM

## 2024-11-30 DIAGNOSIS — J432 Centrilobular emphysema: Secondary | ICD-10-CM | POA: Diagnosis not present

## 2024-11-30 DIAGNOSIS — Z0000 Encounter for general adult medical examination without abnormal findings: Secondary | ICD-10-CM | POA: Diagnosis not present

## 2024-11-30 DIAGNOSIS — E785 Hyperlipidemia, unspecified: Secondary | ICD-10-CM

## 2024-11-30 LAB — LIPID PANEL
Cholesterol: 217 mg/dL — ABNORMAL HIGH (ref 28–200)
HDL: 84.9 mg/dL (ref >39.00)
LDL Cholesterol: 102 mg/dL — ABNORMAL HIGH (ref 10–99)
NonHDL: 132.2
Total CHOL/HDL Ratio: 3
Triglycerides: 149 mg/dL (ref 10.0–149.0)
VLDL: 29.8 mg/dL (ref 0.0–40.0)

## 2024-11-30 LAB — COMPREHENSIVE METABOLIC PANEL WITH GFR
ALT: 13 U/L (ref 3–35)
AST: 18 U/L (ref 5–37)
Albumin: 4.4 g/dL (ref 3.5–5.2)
Alkaline Phosphatase: 73 U/L (ref 39–117)
BUN: 8 mg/dL (ref 6–23)
CO2: 25 meq/L (ref 19–32)
Calcium: 8.8 mg/dL (ref 8.4–10.5)
Chloride: 97 meq/L (ref 96–112)
Creatinine, Ser: 0.68 mg/dL (ref 0.40–1.20)
GFR: 90.72 mL/min (ref >60.00)
Glucose, Bld: 83 mg/dL (ref 70–99)
Potassium: 4.1 meq/L (ref 3.5–5.1)
Sodium: 132 meq/L — ABNORMAL LOW (ref 135–145)
Total Bilirubin: 0.2 mg/dL (ref 0.2–1.2)
Total Protein: 7.3 g/dL (ref 6.0–8.3)

## 2024-11-30 LAB — CBC WITH DIFFERENTIAL/PLATELET
Basophils Absolute: 0.1 K/uL (ref 0.0–0.1)
Basophils Relative: 1.3 % (ref 0.0–3.0)
Eosinophils Absolute: 0.1 K/uL (ref 0.0–0.7)
Eosinophils Relative: 1.4 % (ref 0.0–5.0)
HCT: 33.2 % — ABNORMAL LOW (ref 36.0–46.0)
Hemoglobin: 11.2 g/dL — ABNORMAL LOW (ref 12.0–15.0)
Lymphocytes Relative: 23.6 % (ref 12.0–46.0)
Lymphs Abs: 1.3 K/uL (ref 0.7–4.0)
MCHC: 33.6 g/dL (ref 30.0–36.0)
MCV: 89.4 fl (ref 78.0–100.0)
Monocytes Absolute: 0.4 K/uL (ref 0.1–1.0)
Monocytes Relative: 6.4 % (ref 3.0–12.0)
Neutro Abs: 3.8 K/uL (ref 1.4–7.7)
Neutrophils Relative %: 67.3 % (ref 43.0–77.0)
Platelets: 416 K/uL — ABNORMAL HIGH (ref 150.0–400.0)
RBC: 3.71 Mil/uL — ABNORMAL LOW (ref 3.87–5.11)
RDW: 13.4 % (ref 11.5–15.5)
WBC: 5.7 K/uL (ref 4.0–10.5)

## 2024-11-30 LAB — VITAMIN B12: Vitamin B-12: 326 pg/mL (ref 211–911)

## 2024-11-30 LAB — VITAMIN D 25 HYDROXY (VIT D DEFICIENCY, FRACTURES): VITD: 118.15 ng/mL (ref 30.00–100.00)

## 2024-11-30 MED ORDER — EZETIMIBE 10 MG PO TABS: 10.0000 mg | ORAL_TABLET | Freq: Every day | ORAL | 3 refills | Status: AC

## 2024-11-30 NOTE — Progress Notes (Unsigned)
 BP 124/80 (BP Location: Left Arm, Patient Position: Sitting, Cuff Size: Small)   Pulse 77   Temp 97.6 F (36.4 C)   Ht 5' 2 (1.575 m)   Wt 112 lb 9.6 oz (51.1 kg)   SpO2 99%   BMI 20.59 kg/m    Subjective:    Patient ID: Crystal Clark, female    DOB: 1956-09-07, 68 y.o.   MRN: [id number]  CC: Chief Complaint  Patient presents with   Annual Exam    Wants to discuss sleep medications, Rx refill for 90 day supply    HPI: Crystal Clark is a 68 y.o. female presenting on 11/30/2024 for comprehensive medical examination. Current medical complaints include:none  She currently lives with: husband Menopausal Symptoms: no  Depression and Anxiety Screen done today and results listed below:     11/30/2024    1:52 PM 08/28/2024   11:34 AM 12/21/2023   11:00 AM 08/23/2023   11:28 AM 05/25/2023    8:14 AM  Depression screen PHQ 2/9  Decreased Interest 1 0 0 0 1  Down, Depressed, Hopeless 0 0 0 0 1  PHQ - 2 Score 1 0 0 0 2  Altered sleeping 2 0 2 3 2   Tired, decreased energy 2 0 3 3 3   Change in appetite 2 3 0 2 3  Feeling bad or failure about yourself  0 0 0 0 0  Trouble concentrating 0 0 0 0 0  Moving slowly or fidgety/restless 2 0 1 0 2  Suicidal thoughts 0 0 0 0 0  PHQ-9 Score 9 3  6  8  12    Difficult doing work/chores Not difficult at all Not difficult at all  Somewhat difficult Somewhat difficult     Data saved with a previous flowsheet row definition      11/30/2024    1:53 PM 12/21/2023   11:00 AM 05/25/2023    8:15 AM  GAD 7 : Generalized Anxiety Score  Nervous, Anxious, on Edge 0 0 0  Control/stop worrying 1 0 0  Worry too much - different things 0 0 2  Trouble relaxing 1 0 0  Restless 0 0 0  Easily annoyed or irritable 1 1 2   Afraid - awful might happen 0 0 0  Total GAD 7 Score 3 1 4   Anxiety Difficulty Not difficult at all Not difficult at all Somewhat difficult    The patient has a history of falls. I did complete a risk assessment for falls. A plan of  care for falls was documented.   Past Medical History:  Past Medical History:  Diagnosis Date   Anemia    Chronic mid back pain    T7-8 herniated disc that cannot be repaired   Coronary artery disease    a) s/p stent to LAD in 2006 in [place]. b) unchanged cath 2013. c) tandem 70% lesions distal LAD with ISR s/p balloon angio and DES 07/2014. d) stable cath 08/2016 without obstructive lesion.   Dyslipidemia    GERD (gastroesophageal reflux disease)    History of blood transfusion 1959   MS (multiple sclerosis)    a) since 1997. b) cannot take Imdur  due to medication interactions.   Raynaud's disease dx'd 2005   bb contraindicated   Tobacco use    30+ pack years    Surgical History:  Past Surgical History:  Procedure Laterality Date   CARDIAC CATHETERIZATION  12/2011   CARDIAC CATHETERIZATION N/A 09/03/2016  Procedure: Left Heart Cath and Coronary Angiography;  Surgeon: Alm LELON Clay, MD;  Location: [facility] INVASIVE CV LAB;  Service: Cardiovascular;  Laterality: N/A;   CESAREAN SECTION  X 2   CORONARY ANGIOPLASTY WITH STENT PLACEMENT  03/2005   CORONARY ANGIOPLASTY WITH STENT PLACEMENT  07/18/2014   ISRS- Mid LAD: Promus Premier 2.75 x 16 mm DES    KNEE ARTHROSCOPY Left    LEFT HEART CATHETERIZATION WITH CORONARY ANGIOGRAM N/A 12/23/2011   Procedure: LEFT HEART CATHETERIZATION WITH CORONARY ANGIOGRAM;  Surgeon: Deatrice DELENA Cage, MD;  Location: [facility] CATH LAB;  Service: Cardiovascular;  Laterality: N/A;   LEFT HEART CATHETERIZATION WITH CORONARY ANGIOGRAM N/A 07/18/2014   Procedure: LEFT HEART CATHETERIZATION WITH CORONARY ANGIOGRAM;  Surgeon: Victory LELON Claudene DOUGLAS, MD;  Location: [facility] CATH LAB;  Service: Cardiovascular;  Laterality: N/A;    Medications:  Current Outpatient Medications on File Prior to Visit  Medication Sig   albuterol  (VENTOLIN  HFA) 108 (90 Base) MCG/ACT inhaler Inhale 1 puff into the lungs every 4 (four) hours as needed.   alendronate  (FOSAMAX ) 70 MG tablet Take 1 tablet  (70 mg total) by mouth every 7 (seven) days. Take with a full glass of water on an empty stomach.   amLODipine  (NORVASC ) 5 MG tablet TAKE 1 TABLET(5 MG) BY MOUTH DAILY   aspirin  EC 81 MG tablet Take 81 mg by mouth daily.     atorvastatin  (LIPITOR ) 20 MG tablet Take 1 tablet (20 mg total) by mouth every evening.   baclofen  (LIORESAL ) 10 MG tablet Take 10 mg by mouth as needed.   clopidogrel  (PLAVIX ) 75 MG tablet TAKE 1 TABLET(75 MG) BY MOUTH DAILY   cyanocobalamin  (VITAMIN B12) 1000 MCG/ML injection Inject 1,000 mcg into the muscle once a week.   FLUoxetine  (PROZAC ) 20 MG tablet TAKE 1 TABLET(20 MG) BY MOUTH DAILY   Fluticasone-Umeclidin-Vilant 100-62.5-25 MCG/ACT AEPB Inhale into the lungs.   folic acid  (FOLVITE ) 1 MG tablet Take 1 tablet by mouth daily.   lamoTRIgine  (LAMICTAL ) 25 MG tablet Take 50 mg by mouth 2 (two) times daily.   lidocaine  (LIDODERM ) 5 % Place 2 patches onto the skin daily as needed. For pain Remove & Discard patch within 12 hours or as directed by MD    mirtazapine  (REMERON ) 7.5 MG tablet TAKE 1 TABLET(7.5 MG) BY MOUTH AT BEDTIME   nitroGLYCERIN  (NITROSTAT ) 0.4 MG SL tablet DISSOLVE 1 TABLET UNDER THE TONGUE AS DIRECTED (NOT TO  EXCEED 3 TABLETS IN 15  MINUTES, CALL 911 IF CHEST  PAIN PERSISTS)   pantoprazole  (PROTONIX ) 20 MG tablet Take 1 tablet by mouth daily. (Patient taking differently: Take 1 tablet by mouth as needed.)   pantoprazole  (PROTONIX ) 40 MG tablet Take 1 tablet (40 mg total) by mouth 2 (two) times daily.   ranolazine  (RANEXA ) 500 MG 12 hr tablet TAKE 1 TABLET(500 MG) BY MOUTH TWICE DAILY   Teriflunomide 14 MG TABS Take 14 mg by mouth daily.   traMADol  (ULTRAM ) 50 MG tablet Take 50 mg by mouth every 6 (six) hours as needed for moderate pain.    Vitamin D , Ergocalciferol , (DRISDOL ) 1.25 MG (50000 UNIT) CAPS capsule Take 50,000 Units by mouth every 7 (seven) days.   VUMERITY 231 MG CPDR Take 2 capsules by mouth 2 (two) times daily.   zolpidem  (AMBIEN  CR) 12.5  MG CR tablet Take 12.5 mg by mouth at bedtime as needed for sleep.   ondansetron  (ZOFRAN -ODT) 4 MG disintegrating tablet Take 1 tablet (4 mg total) by mouth  every 8 (eight) hours as needed for nausea or vomiting.   zolpidem  (AMBIEN ) 10 MG tablet Take 10 mg by mouth at bedtime.   No current facility-administered medications on file prior to visit.    Allergies:  Allergies[1]  Social History:  Social History   Socioeconomic History   Marital status: Married    Spouse name: Not on file   Number of children: 2   Years of education: Not on file   Highest education level: Associate degree: academic program  Occupational History   Occupation: disabled    Comment: [employer] for Pulm/cardiac rehab  Tobacco Use   Smoking status: Former    Current packs/day: 0.00    Average packs/day: 0.5 packs/day for 32.0 years (16.0 ttl pk-yrs)    Types: Cigars, Cigarettes    Start date: 01/01/1985    Quit date: 01/01/2017    Years since quitting: 7.9   Smokeless tobacco: Never  Vaping Use   Vaping status: Never Used  Substance and Sexual Activity   Alcohol use: No   Drug use: No   Sexual activity: Yes    Birth control/protection: Post-menopausal  Other Topics Concern   Not on file  Social History Narrative   Not on file   Social Drivers of Health   Tobacco Use: Medium Risk (11/30/2024)   Patient History    Smoking Tobacco Use: Former    Smokeless Tobacco Use: Never    Passive Exposure: Not on Actuary Strain: Low Risk (08/28/2024)   Overall Financial Resource Strain (CARDIA)    Difficulty of Paying Living Expenses: Not hard at all  Food Insecurity: No Food Insecurity (08/28/2024)   Epic    Worried About Programme Researcher, Broadcasting/film/video in the Last Year: Never true    Ran Out of Food in the Last Year: Never true  Transportation Needs: No Transportation Needs (08/28/2024)   Epic    Lack of Transportation (Medical): No    Lack of Transportation (Non-Medical): No  Physical Activity: Inactive  (08/28/2024)   Exercise Vital Sign    Days of Exercise per Week: 0 days    Minutes of Exercise per Session: 0 min  Stress: No Stress Concern Present (08/28/2024)   [employer] of Occupational Health - Occupational Stress Questionnaire    Feeling of Stress: Not at all  Social Connections: Moderately Isolated (08/28/2024)   Social Connection and Isolation Panel    Frequency of Communication with Friends and Family: More than three times a week    Frequency of Social Gatherings with Friends and Family: Once a week    Attends Religious Services: Never    Database Administrator or Organizations: No    Attends Engineer, Structural: Not on file    Marital Status: Married  Catering Manager Violence: Not At Risk (08/28/2024)   Epic    Fear of Current or Ex-Partner: No    Emotionally Abused: No    Physically Abused: No    Sexually Abused: No  Depression (PHQ2-9): Medium Risk (11/30/2024)   Depression (PHQ2-9)    PHQ-2 Score: 9  Alcohol Screen: Low Risk (08/28/2024)   Alcohol Screen    Last Alcohol Screening Score (AUDIT): 1  Housing: Unknown (08/28/2024)   Epic    Unable to Pay for Housing in the Last Year: No    Number of Times Moved in the Last Year: Not on file    Homeless in the Last Year: No  Utilities: Not At Risk (08/28/2024)  Epic    Threatened with loss of utilities: No  Health Literacy: Adequate Health Literacy (08/28/2024)   B1300 Health Literacy    Frequency of need for help with medical instructions: Never   Tobacco Use History[2] Social History   Substance and Sexual Activity  Alcohol Use No    Family History:  Family History  Problem Relation Age of Onset   Colon polyps Mother    Alzheimer's disease Mother    Hypertension Mother    Coronary artery disease Father        s/p CABG at 20   Colon cancer Neg Hx    Stomach cancer Neg Hx    Breast cancer Neg Hx     Past medical history, surgical history, medications, allergies, family history and social  history reviewed with patient today and changes made to appropriate areas of the chart.   Review of Systems  Constitutional:  Positive for malaise/fatigue. Negative for fever.  HENT: Negative.    Eyes: Negative.   Respiratory: Negative.    Cardiovascular: Negative.   Gastrointestinal: Negative.   Genitourinary: Negative.   Musculoskeletal:  Positive for joint pain.  Skin: Negative.   Neurological: Negative.   Psychiatric/Behavioral: Negative.     All other ROS negative except what is listed above and in the HPI.      Objective:    BP 124/80 (BP Location: Left Arm, Patient Position: Sitting, Cuff Size: Small)   Pulse 77   Temp 97.6 F (36.4 C)   Ht 5' 2 (1.575 m)   Wt 112 lb 9.6 oz (51.1 kg)   SpO2 99%   BMI 20.59 kg/m   Wt Readings from Last 3 Encounters:  11/30/24 112 lb 9.6 oz (51.1 kg)  02/25/24 106 lb (48.1 kg)  02/18/24 106 lb 6.4 oz (48.3 kg)    Physical Exam  Results for orders placed or performed in visit on 06/24/23  Cytology - PAP   Collection Time: 06/24/23  1:59 PM  Result Value Ref Range   High risk HPV Negative    Adequacy      Satisfactory for evaluation. The presence or absence of an   Adequacy      endocervical/transformation zone component cannot be determined because   Adequacy of atrophy.    Diagnosis      - Negative for intraepithelial lesion or malignancy (NILM)   Comment Normal Reference Range HPV - Negative   CBC with Differential/Platelet   Collection Time: 06/24/23  2:29 PM  Result Value Ref Range   WBC 8.3 4.0 - 10.5 K/uL   RBC 4.34 3.87 - 5.11 Mil/uL   Hemoglobin 13.4 12.0 - 15.0 g/dL   HCT 60.9 63.9 - 53.9 %   MCV 89.9 78.0 - 100.0 fl   MCHC 34.4 30.0 - 36.0 g/dL   RDW 86.7 88.4 - 84.4 %   Platelets 461.0 (H) 150.0 - 400.0 K/uL   Neutrophils Relative % 78.2 (H) 43.0 - 77.0 %   Lymphocytes Relative 14.9 12.0 - 46.0 %   Monocytes Relative 5.6 3.0 - 12.0 %   Eosinophils Relative 0.6 0.0 - 5.0 %   Basophils Relative 0.7 0.0 -  3.0 %   Neutro Abs 6.4 1.4 - 7.7 K/uL   Lymphs Abs 1.2 0.7 - 4.0 K/uL   Monocytes Absolute 0.5 0.1 - 1.0 K/uL   Eosinophils Absolute 0.0 0.0 - 0.7 K/uL   Basophils Absolute 0.1 0.0 - 0.1 K/uL  Comprehensive metabolic panel   Collection Time: 06/24/23  2:29 PM  Result Value Ref Range   Sodium 127 (L) 135 - 145 mEq/L   Potassium 4.1 3.5 - 5.1 mEq/L   Chloride 92 (L) 96 - 112 mEq/L   CO2 26 19 - 32 mEq/L   Glucose, Bld 90 70 - 99 mg/dL   BUN 7 6 - 23 mg/dL   Creatinine, Ser 9.41 0.40 - 1.20 mg/dL   Total Bilirubin 0.5 0.2 - 1.2 mg/dL   Alkaline Phosphatase 82 39 - 117 U/L   AST 18 0 - 37 U/L   ALT 11 0 - 35 U/L   Total Protein 7.1 6.0 - 8.3 g/dL   Albumin 4.6 3.5 - 5.2 g/dL   GFR 04.77 >39.99 mL/min   Calcium  9.4 8.4 - 10.5 mg/dL  Lipid panel   Collection Time: 06/24/23  2:29 PM  Result Value Ref Range   Cholesterol 290 (H) 0 - 200 mg/dL   Triglycerides 01.9 0.0 - 149.0 mg/dL   HDL 19.59 >60.99 mg/dL   VLDL 80.3 0.0 - 59.9 mg/dL   LDL Cholesterol 809 (H) 0 - 99 mg/dL   Total CHOL/HDL Ratio 4    NonHDL 209.77   TSH   Collection Time: 06/24/23  2:29 PM  Result Value Ref Range   TSH 1.65 0.35 - 5.50 uIU/mL  Vitamin B12   Collection Time: 06/24/23  2:29 PM  Result Value Ref Range   Vitamin B-12 426 211 - 911 pg/mL  VITAMIN D  25 Hydroxy (Vit-D Deficiency, Fractures)   Collection Time: 06/24/23  2:29 PM  Result Value Ref Range   VITD 14.86 (L) 30.00 - 100.00 ng/mL  B. burgdorfi antibodies   Collection Time: 06/24/23  2:29 PM  Result Value Ref Range   B burgdorferi Ab IgG+IgM <0.90 index  HIV Antibody (routine testing w rflx)   Collection Time: 06/24/23  2:29 PM  Result Value Ref Range   HIV 1&2 Ab, 4th Generation NON-REACTIVE NON-REACTIVE  Hepatitis C antibody   Collection Time: 06/24/23  2:29 PM  Result Value Ref Range   Hepatitis C Ab NON-REACTIVE NON-REACTIVE  Rocky mtn spotted fvr abs pnl(IgG+IgM)   Collection Time: 06/24/23  2:29 PM  Result Value Ref Range    RMSF IgG Not Detected Not Detected   RMSF IgM Not Detected Not Detected      Assessment & Plan:   Problem List Items Addressed This Visit       Cardiovascular and Mediastinum   Primary hypertension   Relevant Medications   ezetimibe  (ZETIA ) 10 MG tablet   Other Relevant Orders   CBC with Differential/Platelet   Comprehensive metabolic panel with GFR     Respiratory   Centrilobular emphysema (HCC)     Nervous and Auditory   MS (multiple sclerosis)     Musculoskeletal and Integument   Age-related osteoporosis without current pathological fracture   Relevant Orders   VITAMIN D  25 Hydroxy (Vit-D Deficiency, Fractures)     Other   Dyslipidemia   Relevant Medications   ezetimibe  (ZETIA ) 10 MG tablet   Other Relevant Orders   CBC with Differential/Platelet   Comprehensive metabolic panel with GFR   Lipid panel   Depression, recurrent   Seizures (HCC)   Routine general medical examination at a health care facility - Primary   Vitamin B12 deficiency   Relevant Orders   Vitamin B12     Follow up plan: No follow-ups on file.   LABORATORY TESTING:  - Pap smear: not applicable  IMMUNIZATIONS:   -  Tdap: Tetanus vaccination status reviewed: Declined. - Influenza: Declined - Pneumovax: Not applicable - Prevnar: Declined - HPV: Not applicable - Shingrix vaccine: Not applicable  SCREENING: -Mammogram: Ordered today  - Colonoscopy: Up to date  - Bone Density: Up to date   PATIENT COUNSELING:   Advised to take 1 mg of folate supplement per day if capable of pregnancy.   Sexuality: Discussed sexually transmitted diseases, partner selection, use of condoms, avoidance of unintended pregnancy  and contraceptive alternatives.   Advised to avoid cigarette smoking.  I discussed with the patient that most people either abstain from alcohol or drink within safe limits (<=14/week and <=4 drinks/occasion for males, <=7/weeks and <= 3 drinks/occasion for females) and that the  risk for alcohol disorders and other health effects rises proportionally with the number of drinks per week and how often a drinker exceeds daily limits.  Discussed cessation/primary prevention of drug use and availability of treatment for abuse.   Diet: Encouraged to adjust caloric intake to maintain  or achieve ideal body weight, to reduce intake of dietary saturated fat and total fat, to limit sodium intake by avoiding high sodium foods and not adding table salt, and to maintain adequate dietary potassium and calcium  preferably from fresh fruits, vegetables, and low-fat dairy products.    stressed the importance of regular exercise  Injury prevention: Discussed safety belts, safety helmets, smoke detector, smoking near bedding or upholstery.   Dental health: Discussed importance of regular tooth brushing, flossing, and dental visits.    NEXT PREVENTATIVE PHYSICAL DUE IN 1 YEAR. No follow-ups on file.  Judit Awad A Fallen Crisostomo    [1]  Allergies Allergen Reactions   Morphine  And Codeine Other (See Comments)    Pt states It increases my angina   Simvastatin  Other (See Comments)    makes MS worse   Prednisone     All steroids   [2]  Social History Tobacco Use  Smoking Status Former   Current packs/day: 0.00   Average packs/day: 0.5 packs/day for 32.0 years (16.0 ttl pk-yrs)   Types: Cigars, Cigarettes   Start date: 01/01/1985   Quit date: 01/01/2017   Years since quitting: 7.9  Smokeless Tobacco Never

## 2024-11-30 NOTE — Telephone Encounter (Signed)
 CRITICAL VALUE STICKER  CRITICAL VALUE: Vit D 118.15  RECEIVER (on-site recipient of call): Maxamillian Tienda   DATE & TIME NOTIFIED: 11/30/24 1634   MESSENGER (representative from lab): Saa  MD NOTIFIED: Nedra  TIME OF NOTIFICATION: 11/30/24 1643  RESPONSE: Provider aware. Advise pt to discontinue Vit D supplement.

## 2024-11-30 NOTE — Patient Instructions (Signed)
 It was great to see you!  We are checking your labs today and will let you know the results via mychart/phone.   You can use good rx to get your zolpidem  for less expensive price  Let's follow-up in 6 months, sooner if you have concerns.  If a referral was placed today, you will be contacted for an appointment. Please note that routine referrals can sometimes take up to 3-4 weeks to process. Please call our office if you haven't heard anything after this time frame.  Take care,  Tinnie Harada, NP

## 2024-11-30 NOTE — Telephone Encounter (Signed)
 LVM with results and instructions to stop Vit D supplement.

## 2024-12-01 NOTE — Assessment & Plan Note (Signed)
Check vitamin B12 levels today and treat based on results.

## 2024-12-01 NOTE — Assessment & Plan Note (Signed)
Chronic, stable.  She is currently taking Trelegy inhaler daily.  Continue this regimen as her symptoms are well controlled. 

## 2024-12-01 NOTE — Assessment & Plan Note (Signed)
 Chronic, stable.  Continue Ambien  10 mg at bedtime as needed. Discussed that she can use goodrx to get this medication since her insurance won't cover it.

## 2024-12-01 NOTE — Assessment & Plan Note (Signed)
 Health maintenance reviewed and updated. Discussed nutrition, exercise. Follow-up 1 year.

## 2024-12-01 NOTE — Assessment & Plan Note (Signed)
Chronic, stable.  Continue atorvastatin 20 mg daily and Zetia 10 mg daily. Check CMP, CBC, lipid panel today.

## 2024-12-01 NOTE — Assessment & Plan Note (Signed)
Chronic, stable.  Continue fluoxetine 20 mg daily. 

## 2024-12-01 NOTE — Assessment & Plan Note (Signed)
 Chronic, not controlled. A recent DEXA scan indicates severe osteoporosis with a T-score of -3.8. Insurance did not cover evenity. Will have her continue fosomax 70mg  weekly.

## 2024-12-01 NOTE — Assessment & Plan Note (Signed)
 Chronic, stable. She is currently following with neurology. She does have occasional falls, no injuries. Continue teriflunomide 14mg  daily and collaboration from specialist.

## 2024-12-01 NOTE — Assessment & Plan Note (Signed)
Chronic, stable.  Continue amlodipine 5 mg daily. Check CMP, CBC today.

## 2024-12-01 NOTE — Assessment & Plan Note (Signed)
Chronic, stable.  Continue Lamictal 50 mg twice a day.  She is following with neurology.  Continue collaboration's recommendations from specialist 

## 2024-12-11 ENCOUNTER — Other Ambulatory Visit: Payer: Self-pay | Admitting: Nurse Practitioner

## 2024-12-11 NOTE — Telephone Encounter (Signed)
 Requesting: FLUOXETINE  20MG  TABLETS  Last Visit: 11/30/2024 Next Visit: 05/31/2025 Last Refill: 06/05/2024  Please Advise

## 2024-12-11 NOTE — Telephone Encounter (Signed)
 DUPLICATE REQUEST

## 2025-01-15 ENCOUNTER — Other Ambulatory Visit: Payer: Self-pay | Admitting: Nurse Practitioner

## 2025-05-31 ENCOUNTER — Ambulatory Visit: Admitting: Nurse Practitioner

## 2025-09-03 ENCOUNTER — Ambulatory Visit
